# Patient Record
Sex: Male | Born: 1950 | Race: White | Hispanic: No | Marital: Married | Smoking: Never smoker
Health system: Southern US, Community
[De-identification: ages and names within clinical notes are randomized; demographics above are authoritative.]

## PROBLEM LIST (undated history)

## (undated) DIAGNOSIS — R7989 Other specified abnormal findings of blood chemistry: Secondary | ICD-10-CM

## (undated) DIAGNOSIS — K625 Hemorrhage of anus and rectum: Secondary | ICD-10-CM

## (undated) DIAGNOSIS — E785 Hyperlipidemia, unspecified: Secondary | ICD-10-CM

## (undated) DIAGNOSIS — F329 Major depressive disorder, single episode, unspecified: Secondary | ICD-10-CM

## (undated) DIAGNOSIS — T7840XA Allergy, unspecified, initial encounter: Secondary | ICD-10-CM

## (undated) DIAGNOSIS — I10 Essential (primary) hypertension: Secondary | ICD-10-CM

## (undated) DIAGNOSIS — K635 Polyp of colon: Secondary | ICD-10-CM

## (undated) DIAGNOSIS — F3289 Other specified depressive episodes: Secondary | ICD-10-CM

## (undated) DIAGNOSIS — E119 Type 2 diabetes mellitus without complications: Secondary | ICD-10-CM

## (undated) HISTORY — DX: Essential (primary) hypertension: I10

## (undated) HISTORY — PX: COLONOSCOPY: SHX174

## (undated) HISTORY — PX: KNEE SURGERY: SHX244

## (undated) HISTORY — DX: Type 2 diabetes mellitus without complications: E11.9

## (undated) HISTORY — PX: POLYPECTOMY: SHX149

## (undated) HISTORY — DX: Polyp of colon: K63.5

## (undated) HISTORY — DX: Major depressive disorder, single episode, unspecified: F32.9

## (undated) HISTORY — PX: CYST REMOVAL TRUNK: SHX6283

## (undated) HISTORY — DX: Hemorrhage of anus and rectum: K62.5

## (undated) HISTORY — DX: Allergy, unspecified, initial encounter: T78.40XA

## (undated) HISTORY — DX: Hyperlipidemia, unspecified: E78.5

## (undated) HISTORY — DX: Other specified abnormal findings of blood chemistry: R79.89

## (undated) HISTORY — DX: Other specified depressive episodes: F32.89

---

## 2006-11-26 ENCOUNTER — Ambulatory Visit: Payer: Self-pay | Admitting: Cardiology

## 2006-12-11 ENCOUNTER — Encounter: Payer: Self-pay | Admitting: Cardiology

## 2006-12-11 ENCOUNTER — Ambulatory Visit: Payer: Self-pay

## 2007-11-16 LAB — HM COLONOSCOPY

## 2007-11-23 ENCOUNTER — Ambulatory Visit: Payer: Self-pay | Admitting: Gastroenterology

## 2007-12-08 ENCOUNTER — Encounter: Payer: Self-pay | Admitting: Gastroenterology

## 2007-12-08 ENCOUNTER — Ambulatory Visit: Payer: Self-pay | Admitting: Gastroenterology

## 2010-12-31 LAB — HM DIABETES FOOT EXAM

## 2010-12-31 LAB — HEMOCCULT GUIAC POC 1CARD (OFFICE)

## 2011-01-07 DIAGNOSIS — E785 Hyperlipidemia, unspecified: Secondary | ICD-10-CM

## 2011-01-07 DIAGNOSIS — E1169 Type 2 diabetes mellitus with other specified complication: Secondary | ICD-10-CM | POA: Insufficient documentation

## 2011-01-07 DIAGNOSIS — M26629 Arthralgia of temporomandibular joint, unspecified side: Secondary | ICD-10-CM | POA: Insufficient documentation

## 2011-01-09 ENCOUNTER — Encounter: Payer: Self-pay | Admitting: Cardiology

## 2011-01-09 ENCOUNTER — Ambulatory Visit
Admission: RE | Admit: 2011-01-09 | Discharge: 2011-01-09 | Payer: Self-pay | Source: Home / Self Care | Attending: Cardiology | Admitting: Cardiology

## 2011-01-09 DIAGNOSIS — R072 Precordial pain: Secondary | ICD-10-CM | POA: Insufficient documentation

## 2011-01-09 DIAGNOSIS — E669 Obesity, unspecified: Secondary | ICD-10-CM

## 2011-01-17 NOTE — Assessment & Plan Note (Signed)
Summary: [facility]  Medications Added SIMVASTATIN 10 MG TABS (SIMVASTATIN) 1 by mouth daily ASPIRIN 81 MG  TABS (ASPIRIN) 1 by mouth daily LOVAZA 1 GM CAPS (OMEGA-3-ACID ETHYL ESTERS) 2 by mouth daily      Allergies Added: ! SULFA  Visit Type:  Initial Consult Primary Provider:  Dr. Christell Constant  CC:  chest pain.  History of Present Illness: The patient presents for evaluation of chest pain.  This pain is similar to pain that he had in the past.  At that time his ETT suggested ischemia.  However, stress perfusion study was negative.  He now presents with pain that starts under his left shoulder blade and radiates around to his anterior chest. This has been going on since about Christmas. It happens almost daily. It typically lasts all day it is mild. He can't bring in home activity. Speech is 5/10 in intensity. It is not associated with nausea vomiting or diaphoresis. He does notice that it's worse with certain movements and he sometimes feels a popping sensation which may worsen it will be followed by relief. He will occasionally take Motrin with some improvement. He has not had nausea or vomiting. He has not had diaphoresis. He has no shortness of breath. There is no jaw or arm discomfort.  Current Medications (verified): 1)  Simvastatin 10 Mg Tabs (Simvastatin) .Marland Kitchen.. 1 By Mouth Daily 2)  Aspirin 81 Mg  Tabs (Aspirin) .Marland Kitchen.. 1 By Mouth Daily 3)  Lovaza 1 Gm Caps (Omega-3-Acid Ethyl Esters) .... 2 By Mouth Daily  Allergies (verified): 1)  ! 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 Dyslipidemia Snoring  Past Surgical History: Cyst resected.  Family History: Not contributory for early coronary disease, but his father did have bypass in his 66s.  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  He does not smoke cigarettes or drink alcohol.  Review of Systems     As stated in the HPI and negative for all other systems.   Vital Signs:  Patient profile:   60 year old male Height:      70 inches Weight:      255 pounds BMI:     36.72 Pulse rate:   78 / minute Resp:     18 per minute BP sitting:   138 / 88  (right arm)  Vitals Entered By: Marrion Coy, CNA (January 09, 2011 3:48 PM)  Physical Exam  General:  Well developed, well nourished, in no acute distress. Head:  normocephalic and atraumatic Eyes:  PERRLA/EOM intact; conjunctiva and lids normal. Mouth:  Teeth, gums and palate normal. Oral mucosa normal. Neck:  Neck supple, no JVD. No masses, thyromegaly or abnormal cervical nodes. Chest Wall:  no deformities or breast masses noted Lungs:  Clear bilaterally to auscultation and percussion. Abdomen:  Bowel sounds positive; abdomen soft and non-tender without masses, organomegaly, or hernias noted. No hepatosplenomegaly, splenomegaly, Obese Msk:  Back normal, normal gait. Muscle strength and tone normal. Extremities:  No clubbing or cyanosis. Neurologic:  Alert and oriented x 3. Skin:  Intact without lesions or rashes. Cervical Nodes:  no significant adenopathy Inguinal Nodes:  no significant adenopathy Psych:  Normal affect.   Detailed Cardiovascular Exam  Neck    Carotids: Carotids full and equal bilaterally without bruits.      Neck Veins: Normal, no JVD.  4cm JVD.    Heart  Inspection: no deformities or lifts noted.      Palpation: normal PMI with no thrills palpable.      Auscultation: regular rate and rhythm, S1, S2 without murmurs, rubs, gallops, or clicks.    Vascular    Abdominal Aorta: no palpable masses, pulsations, or audible bruits.      Femoral Pulses: normal femoral pulses bilaterally.      Pedal Pulses: normal pedal pulses bilaterally.      Radial Pulses: normal radial pulses bilaterally.      Peripheral Circulation: no clubbing, cyanosis, or edema noted with normal capillary refill.     EKG  Procedure  date:  01/09/2011  Findings:      Sinus rhythm, rate 78, axis within normal limits, intervals within normal limits, no acute ST-T wave changes  Impression & Recommendations:  Problem # 1:  PRECORDIAL PAIN (ICD-786.51) The patient's pain is an atypical.  However, he does have cardiovascular risk factors. I did a screening with an exercise treadmill test would be indicated. I do note that he had some ST depression on his previous treadmill and I will be aware of this to try to avoid false positive reading. Orders: EKG w/ Interpretation (93000) Treadmill (Treadmill)  Problem # 2:  HYPERLIPIDEMIA (ICD-272.4) His lipids were not well controlled with the last reading and I have discussed with him the importance of diet along with followup for his medications. I will defer to his primary providers.  Problem # 3:  OBESITY, UNSPECIFIED (ICD-278.00) We began to discuss weight loss strategies and I will reemphasize this at the time of the treadmill.  Patient Instructions: 1)  Your physician recommends that you schedule a follow-up appointment at the time of your treadmill 2)  Your physician recommends that you continue on your current medications as directed. Please refer to the Current Medication list given to you today. 3)  Your physician has requested that you have an exercise tolerance test.  For further information please visit [email or web address]  Please also follow instruction sheet, as given.

## 2011-02-06 ENCOUNTER — Encounter: Payer: Self-pay | Admitting: Cardiology

## 2011-02-06 ENCOUNTER — Encounter (INDEPENDENT_AMBULATORY_CARE_PROVIDER_SITE_OTHER): Payer: BC Managed Care – PPO | Admitting: Cardiology

## 2011-02-06 ENCOUNTER — Encounter (INDEPENDENT_AMBULATORY_CARE_PROVIDER_SITE_OTHER): Payer: BC Managed Care – PPO

## 2011-02-06 DIAGNOSIS — R072 Precordial pain: Secondary | ICD-10-CM

## 2011-02-06 DIAGNOSIS — R0989 Other specified symptoms and signs involving the circulatory and respiratory systems: Secondary | ICD-10-CM

## 2011-03-29 ENCOUNTER — Encounter: Payer: Self-pay | Admitting: Family Medicine

## 2011-03-29 DIAGNOSIS — F411 Generalized anxiety disorder: Secondary | ICD-10-CM | POA: Insufficient documentation

## 2011-03-29 DIAGNOSIS — F329 Major depressive disorder, single episode, unspecified: Secondary | ICD-10-CM | POA: Insufficient documentation

## 2011-03-29 DIAGNOSIS — Z7985 Long-term (current) use of injectable non-insulin antidiabetic drugs: Secondary | ICD-10-CM | POA: Insufficient documentation

## 2011-03-29 DIAGNOSIS — K625 Hemorrhage of anus and rectum: Secondary | ICD-10-CM

## 2011-03-29 DIAGNOSIS — K635 Polyp of colon: Secondary | ICD-10-CM

## 2011-03-29 DIAGNOSIS — F3289 Other specified depressive episodes: Secondary | ICD-10-CM | POA: Insufficient documentation

## 2011-03-29 DIAGNOSIS — E119 Type 2 diabetes mellitus without complications: Secondary | ICD-10-CM

## 2011-03-29 DIAGNOSIS — R7989 Other specified abnormal findings of blood chemistry: Secondary | ICD-10-CM

## 2011-03-29 DIAGNOSIS — N400 Enlarged prostate without lower urinary tract symptoms: Secondary | ICD-10-CM | POA: Insufficient documentation

## 2011-04-19 ENCOUNTER — Encounter: Payer: Self-pay | Admitting: Cardiology

## 2011-05-03 NOTE — Assessment & Plan Note (Signed)
[facility]                            CARDIOLOGY OFFICE NOTE   ALANSON, HAUSMANN                     MRN:          [id number]  DATE:11/26/2006                            DOB:          16-Jun-1951    PRIMARY CARE PHYSICIAN:  Ernestina Penna, M.D.   REASON FOR REFERRAL:  Evaluate patient with chest pain.   HISTORY OF PRESENT ILLNESS:  The patient presents for followup of the  above.  He is a 60 year old gentleman, who has had some chest discomfort  in the past and had a treadmill test.  This has been abnormal with some  ischemic ST segment changes.  He has had to have followup with a stress  perfusion study, demonstrating no evidence of ischemia.  He has been  told in the past that he has false positive treadmill tests and probably  should not have a plain exercise treadmill.   Recently, his father had bypass.  His mother also has heart disease.  He  has been quite concerned because of this family history and because he  has been getting some chest discomfort.  He has noted a sporadic  discomfort in his left chest.  He finds it somewhat hard to quantify and  qualify.  It seems to happen at rest.  He is not exercising routinely.  However, he does do some bowling and cannot bring on this discomfort.  It seems to be somewhat moderate in intensity, without radiation to his  jaw or to his arms.  He has had no palpitations, presyncope or syncope.  He denies any associated diaphoresis, nausea or vomiting.  He does not  recall having symptoms like this before.   PAST MEDICAL HISTORY:  Hyperlipidemia, jaw discomfort and questionable  TMJ, depression.   PAST SURGICAL HISTORY:  Cyst resected.   ALLERGIES:  SULFA.   MEDICATIONS:  Ibuprofen.   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  He has a  child and one grandchild.  He does not smoke cigarettes or drink  alcohol.   FAMILY HISTORY:  Contributory for early coronary disease, but  in later  onset first-degree relatives.   REVIEW OF SYSTEMS:  As stated in the HPI.  Positive for snoring,  fatigue, rhinitis, intentional weight-loss.  Negative for other systems.   PHYSICAL EXAMINATION:  The patient is in no distress.  Blood pressure  122/88, heart rate 77 and regular, weight 262 pounds, body mass index  38.  HEENT:  Eyes are unremarkable.  Pupils were equal, round and reactive to  light.  Fundi within normal limits.  Oral mucosa unremarkable.  NECK:  No jugular venous distention, wave form within normal limits.  Carotid upstroke brisk and symmetrical.  No bruits.  No thyromegaly.  LYMPHATICS:  No cervical, axillary or inguinal adenopathy.  LUNGS:  Clear to auscultation bilaterally.  BACK:  No costovertebral angle tenderness.  CHEST:  Unremarkable.  HEART:  PMI not displaced or sustained.  S1 and S2 within normal limits.  No S3, no S4, no murmurs.  ABDOMEN:  Obese, positive bowel sounds, normal in  frequency and pitch.  No bruits, rebound, guarding or midline pulsatile mass.  No  hepatomegaly, no splenomegaly.  SKIN:  No rashes, no nodules.  EXTREMITIES:  2+ pulses throughout, no edema, no cyanosis, no clubbing.  NEUROLOGIC:  Oriented to person, place and time.  Cranial nerves II  through XII grossly intact.  Motor grossly intact.   EKG:  Sinus rhythm, rate 77, axis within normal limits, intervals within  normal limits, no acute STT-wave changes.   ASSESSMENT AND PLAN:  1. Chest discomfort:  Permanent sterilization chest discomfort is      somewhat atypical.  However, he has cardiovascular risk factors.      Exercise treadmill tests have been false positive in the past.      Therefore, we will need the added specificity of stress perfusion      study.  He can walk on a treadmill and have an exercise Cardiolite.      Further evaluation based on these results.  2. Obesity:  The patient is class 2 obesity.  I took the liberty of      discussing diet with him and  suggested a [place] approach.  He      will look into this.  3. Questionable sleep apnea.  The patient does snore and has fatigue.      I discussed sleep study with him and he will consider whether he      wants to have this in the future.  He can discuss this with Dr.      Christell Constant.  4. Followup will be based on the results of the above.     Rollene Rotunda, MD, [facility]  Electronically Signed    JH/MedQ  DD: 11/26/2006  DT: 11/26/2006  Job #: [id number]   cc:   Ernestina Penna, M.D.

## 2012-07-09 DIAGNOSIS — H25819 Combined forms of age-related cataract, unspecified eye: Secondary | ICD-10-CM | POA: Insufficient documentation

## 2012-07-09 DIAGNOSIS — H5210 Myopia, unspecified eye: Secondary | ICD-10-CM | POA: Insufficient documentation

## 2012-10-15 ENCOUNTER — Encounter: Payer: Self-pay | Admitting: Gastroenterology

## 2013-02-12 ENCOUNTER — Other Ambulatory Visit: Payer: Self-pay | Admitting: Family Medicine

## 2013-02-12 DIAGNOSIS — M949 Disorder of cartilage, unspecified: Secondary | ICD-10-CM

## 2013-02-21 ENCOUNTER — Ambulatory Visit
Admission: RE | Admit: 2013-02-21 | Discharge: 2013-02-21 | Disposition: A | Discharge status: Home / Self Care | Payer: BC Managed Care – PPO | Source: Ambulatory Visit | Attending: Family Medicine | Admitting: Family Medicine

## 2013-02-21 DIAGNOSIS — M899 Disorder of bone, unspecified: Secondary | ICD-10-CM

## 2013-03-02 ENCOUNTER — Encounter: Payer: Self-pay | Admitting: Family Medicine

## 2013-03-02 ENCOUNTER — Ambulatory Visit (INDEPENDENT_AMBULATORY_CARE_PROVIDER_SITE_OTHER): Payer: BC Managed Care – PPO | Admitting: Family Medicine

## 2013-03-02 VITALS — BP 129/74 | HR 77 | Temp 97.6°F | Ht 69.0 in | Wt 252.0 lb

## 2013-03-02 DIAGNOSIS — M4802 Spinal stenosis, cervical region: Secondary | ICD-10-CM

## 2013-03-02 NOTE — Progress Notes (Signed)
  Subjective:    Patient ID: Richard Nelson, male    DOB: Aug 08, 1951, 62 y.o.   MRN: [id number]  HPI patient is here today to review MRI and C-spine results. He has a history of neck pain and neuropathy. He has no new problems today.    Review of Systems not performed today     Objective:   Physical Exam  Constitutional: He is oriented to person, place, and time. He appears well-developed and well-nourished.  HENT:  Head: Normocephalic.  Cardiovascular: Normal rate.   Pulmonary/Chest: Effort normal.  Musculoskeletal: Normal range of motion.  Neurological: He is alert and oriented to person, place, and time. He has normal reflexes.  Psychiatric: He has a normal mood and affect. His behavior is normal. Judgment normal.          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 Jeral Fruit, neurosurgeon

## 2013-03-05 ENCOUNTER — Ambulatory Visit: Payer: Self-pay | Admitting: Family Medicine

## 2013-06-07 ENCOUNTER — Encounter: Payer: Self-pay | Admitting: Gastroenterology

## 2013-06-16 ENCOUNTER — Ambulatory Visit (AMBULATORY_SURGERY_CENTER): Payer: BC Managed Care – PPO | Admitting: *Deleted

## 2013-06-16 ENCOUNTER — Encounter: Payer: Self-pay | Admitting: Gastroenterology

## 2013-06-16 VITALS — Ht 69.5 in | Wt 256.6 lb

## 2013-06-16 DIAGNOSIS — Z86010 Personal history of colonic polyps: Secondary | ICD-10-CM

## 2013-06-16 MED ORDER — MOVIPREP 100 G PO SOLR: 1.0000 | Freq: Once | ORAL | Status: DC

## 2013-06-16 NOTE — Progress Notes (Signed)
NO EGG OR SOY ALLERGY. EWM NO HOME 02 USE. EWM NO PROBLEMS WITH PAST SEDATION. EWM PREVIOUS COLONOSCOPY WITH STARK 2008. ewm

## 2013-07-02 ENCOUNTER — Ambulatory Visit (AMBULATORY_SURGERY_CENTER): Payer: BC Managed Care – PPO | Admitting: Gastroenterology

## 2013-07-02 ENCOUNTER — Encounter: Payer: Self-pay | Admitting: Gastroenterology

## 2013-07-02 VITALS — BP 159/84 | HR 76 | Temp 97.6°F | Resp 18 | Ht 69.5 in | Wt 256.0 lb

## 2013-07-02 DIAGNOSIS — Z86010 Personal history of colonic polyps: Secondary | ICD-10-CM

## 2013-07-02 DIAGNOSIS — D126 Benign neoplasm of colon, unspecified: Secondary | ICD-10-CM

## 2013-07-02 MED ORDER — SODIUM CHLORIDE 0.9 % IV SOLN: 500.0000 mL | INTRAVENOUS | Status: DC

## 2013-07-02 NOTE — Progress Notes (Signed)
room staff made aware of elevated blood pressure and pt. Drinking 2 0z of water at 12 noon prior to procedure.

## 2013-07-02 NOTE — Op Note (Signed)
[facility] [address] [place], [address]   COLONOSCOPY PROCEDURE REPORT  PATIENT: Richard Nelson, Richard Nelson  MR#: [id number] BIRTHDATE: [birth year] , 61  yrs. old GENDER: Male ENDOSCOPIST: Meryl Dare, MD, [facility] PROCEDURE DATE:  07/02/2013 PROCEDURE:   Colonoscopy with snare polypectomy ASA CLASS:   Class II INDICATIONS:Patient's personal history of adenomatous colon polyps and Last colonoscopy performed 5 years ago. MEDICATIONS: MAC sedation, administered by CRNA and propofol (Diprivan) 200mg  IV DESCRIPTION OF PROCEDURE:   After the risks benefits and alternatives of the procedure were thoroughly explained, informed consent was obtained.  A digital rectal exam revealed no abnormalities of the rectum.   The LB [id number]  endoscope was introduced through the anus and advanced to the cecum, which was identified by both the appendix and ileocecal valve. No adverse events experienced.   The quality of the prep was excellent, using MoviPrep  The instrument was then slowly withdrawn as the colon was fully examined.  COLON FINDINGS: Mild diverticulosis was noted in the transverse colon.   A sessile polyp measuring 5 mm in size was found in the transverse colon.  A polypectomy was performed with a cold snare. The resection was complete and the polyp tissue was completely retrieved.   The colon was otherwise normal.  There was no diverticulosis, inflammation, polyps or cancers unless previously stated.  Retroflexed views revealed small internal hemorrhoids. The time to cecum=1 minutes 38 seconds.  Withdrawal time=12 minutes 04 seconds.  The scope was withdrawn and the procedure completed.  COMPLICATIONS: There were no complications.  ENDOSCOPIC IMPRESSION: 1.   Mild diverticulosis was noted in the transverse colon 2.   Sessile polyp measuring 5 mm in the transverse colon; polypectomy performed with a cold snare 3.   Small internal  hemorrhoids  RECOMMENDATIONS: 1.  Await pathology results 2.  Repeat Colonoscopy in 5 years.   eSigned:  Meryl Dare, MD, [facility] 07/02/2013 2:07 PM

## 2013-07-02 NOTE — Patient Instructions (Addendum)
YOU HAD AN ENDOSCOPIC PROCEDURE TODAY AT THE [facility]: Refer to the procedure report that was given to you for any specific questions about what was found during the examination.  If the procedure report does not answer your questions, please call your gastroenterologist to clarify.  If you requested that your care partner not be given the details of your procedure findings, then the procedure report has been included in a sealed envelope for you to review at your convenience later.  YOU SHOULD EXPECT: Some feelings of bloating in the abdomen. Passage of more gas than usual.  Walking can help get rid of the air that was put into your GI tract during the procedure and reduce the bloating. If you had a lower endoscopy (such as a colonoscopy or flexible sigmoidoscopy) you may notice spotting of blood in your stool or on the toilet paper. If you underwent a bowel prep for your procedure, then you may not have a normal bowel movement for a few days.  DIET: Your first meal following the procedure should be a light meal and then it is ok to progress to your normal diet.  A half-sandwich or bowl of soup is an example of a good first meal.  Heavy or fried foods are harder to digest and may make you feel nauseous or bloated.  Likewise meals heavy in dairy and vegetables can cause extra gas to form and this can also increase the bloating.  Drink plenty of fluids but you should avoid alcoholic beverages for 24 hours.  Please, take your blood pressure medicine when you get home.  It has been high in recovery.  Try to swtay away from very salty and fried foods, and eat more fresh veggies and fruit.   Exercising would be helpful too.  ACTIVITY: Your care partner should take you home directly after the procedure.  You should plan to take it easy, moving slowly for the rest of the day.  You can resume normal activity the day after the procedure however you should NOT DRIVE or use heavy machinery for 24 hours  (because of the sedation medicines used during the test).    SYMPTOMS TO REPORT IMMEDIATELY: A gastroenterologist can be reached at any hour.  During normal business hours, 8:30 AM to 5:00 PM Monday through Friday, call [phone].  After hours and on weekends, please call the GI answering service at [phone] who will take a message and have the physician on call contact you.   Following lower endoscopy (colonoscopy or flexible sigmoidoscopy):  Excessive amounts of blood in the stool  Significant tenderness or worsening of abdominal pains  Swelling of the abdomen that is new, acute  Fever of 100F or higher  FOLLOW UP: If any biopsies were taken you will be contacted by phone or by letter within the next 1-3 weeks.  Call your gastroenterologist if you have not heard about the biopsies in 3 weeks.  Our staff will call the home number listed on your records the next business day following your procedure to check on you and address any questions or concerns that you may have at that time regarding the information given to you following your procedure. This is a courtesy call and so if there is no answer at the home number and we have not heard from you through the emergency physician on call, we will assume that you have returned to your regular daily activities without incident.  SIGNATURES/CONFIDENTIALITY: You and/or your care partner  have signed paperwork which will be entered into your electronic medical record.  These signatures attest to the fact that that the information above on your After Visit Summary has been reviewed and is understood.  Full responsibility of the confidentiality of this discharge information lies with you and/or your care-partner.

## 2013-07-02 NOTE — Progress Notes (Signed)
Called to room to assist during endoscopic procedure.  Patient ID and intended procedure confirmed with present staff. Received instructions for my participation in the procedure from the performing physician.  

## 2013-07-02 NOTE — Progress Notes (Addendum)
Patient did not have preoperative order for IV antibiotic SSI prophylaxis. (G8918)  Patient did not experience any of the following events: a burn prior to discharge; a fall within the facility; wrong site/side/patient/procedure/implant event; or a hospital transfer or hospital admission upon discharge from the facility. (G8907)  

## 2013-07-05 ENCOUNTER — Telehealth: Payer: Self-pay | Admitting: *Deleted

## 2013-07-05 NOTE — Telephone Encounter (Signed)
  Follow up Call-  Call back number 07/02/2013  Post procedure Call Back phone  # [phone]  Permission to leave phone message Yes   No answer at # given.  Left message on voicemail.

## 2013-07-07 ENCOUNTER — Encounter: Payer: Self-pay | Admitting: Gastroenterology

## 2013-07-08 ENCOUNTER — Other Ambulatory Visit: Payer: Self-pay | Admitting: Family Medicine

## 2013-08-10 ENCOUNTER — Other Ambulatory Visit: Payer: Self-pay | Admitting: Family Medicine

## 2013-08-11 NOTE — Telephone Encounter (Signed)
Last seen 02/20/13 DWM  Last lipid 08/21/12

## 2013-08-11 NOTE — Telephone Encounter (Signed)
This patient needs to come in and get blood work .

## 2013-09-13 ENCOUNTER — Other Ambulatory Visit: Payer: Self-pay | Admitting: Family Medicine

## 2013-09-15 ENCOUNTER — Other Ambulatory Visit: Payer: Self-pay

## 2013-09-15 MED ORDER — SIMVASTATIN 10 MG PO TABS: 10.0000 mg | ORAL_TABLET | Freq: Every day | ORAL | Status: DC

## 2013-09-15 NOTE — Telephone Encounter (Signed)
You may refill this prescription x1, the patient must understand he has to get lipid liver panel done at least every 6 months for [place] to continue to refill the medication

## 2013-09-15 NOTE — Telephone Encounter (Signed)
Last lipids 08/21/12  DWM

## 2013-10-02 ENCOUNTER — Other Ambulatory Visit: Payer: Self-pay | Admitting: Family Medicine

## 2013-10-04 NOTE — Telephone Encounter (Signed)
Please approve a one month's supply and please emphasize to the patient the importance of him coming in to be checked, he needs an appointment

## 2013-10-04 NOTE — Telephone Encounter (Signed)
Last seen 03/02/13 DWM  Last glucose 07/02/13  Patient requesting a day supply

## 2013-10-16 ENCOUNTER — Other Ambulatory Visit: Payer: Self-pay | Admitting: Family Medicine

## 2013-10-18 NOTE — Telephone Encounter (Signed)
Patient notified at last refill NTBS. Please advise 

## 2013-10-19 ENCOUNTER — Other Ambulatory Visit (INDEPENDENT_AMBULATORY_CARE_PROVIDER_SITE_OTHER): Payer: BC Managed Care – PPO

## 2013-10-19 DIAGNOSIS — E559 Vitamin D deficiency, unspecified: Secondary | ICD-10-CM

## 2013-10-19 DIAGNOSIS — E1059 Type 1 diabetes mellitus with other circulatory complications: Secondary | ICD-10-CM

## 2013-10-19 DIAGNOSIS — I10 Essential (primary) hypertension: Secondary | ICD-10-CM

## 2013-10-19 DIAGNOSIS — E785 Hyperlipidemia, unspecified: Secondary | ICD-10-CM

## 2013-10-19 LAB — POCT CBC
HCT, POC: 45.1 % (ref 43.5–53.7)
Lymph, poc: 1.8 (ref 0.6–3.4)
MCH, POC: 26.8 pg — AB (ref 27–31.2)
MCV: 79.1 fL — AB (ref 80–97)
Platelet Count, POC: 197 10*3/uL (ref 142–424)
RBC: 5.7 M/uL (ref 4.69–6.13)
RDW, POC: 12.2 %
WBC: 6.8 10*3/uL (ref 4.6–10.2)

## 2013-10-19 NOTE — Progress Notes (Signed)
Pt came in for labs only 

## 2013-10-21 LAB — BMP8+EGFR
BUN/Creatinine Ratio: 15 (ref 10–22)
BUN: 11 mg/dL (ref 8–27)
CO2: 21 mmol/L (ref 18–29)
Calcium: 9.3 mg/dL (ref 8.6–10.2)
Chloride: 98 mmol/L (ref 97–108)
GFR calc Af Amer: 115 mL/min/{1.73_m2} (ref >59)
GFR calc non Af Amer: 100 mL/min/{1.73_m2} (ref >59)
Potassium: 4.4 mmol/L (ref 3.5–5.2)

## 2013-10-21 LAB — HEPATIC FUNCTION PANEL
AST: 25 IU/L (ref 0–40)
Albumin: 4.2 g/dL (ref 3.6–4.8)
Alkaline Phosphatase: 115 IU/L (ref 39–117)
Bilirubin, Direct: 0.12 mg/dL (ref 0.00–0.40)
Total Bilirubin: 0.3 mg/dL (ref 0.0–1.2)

## 2013-10-21 LAB — NMR, LIPOPROFILE
Cholesterol: 140 mg/dL (ref <200)
HDL Particle Number: 29.7 umol/L — ABNORMAL LOW (ref >=30.5)
LDL Size: 19.6 nm — ABNORMAL LOW (ref >20.5)
LDLC SERPL CALC-MCNC: 70 mg/dL (ref <100)
LP-IR Score: 67 — ABNORMAL HIGH (ref <=45)
Small LDL Particle Number: 1453 nmol/L — ABNORMAL HIGH (ref <=527)

## 2013-10-25 ENCOUNTER — Ambulatory Visit (INDEPENDENT_AMBULATORY_CARE_PROVIDER_SITE_OTHER): Payer: BC Managed Care – PPO | Admitting: Family Medicine

## 2013-10-25 ENCOUNTER — Ambulatory Visit (INDEPENDENT_AMBULATORY_CARE_PROVIDER_SITE_OTHER): Payer: BC Managed Care – PPO

## 2013-10-25 ENCOUNTER — Encounter: Payer: Self-pay | Admitting: Family Medicine

## 2013-10-25 VITALS — BP 139/84 | HR 92 | Temp 99.7°F | Ht 69.5 in | Wt 252.0 lb

## 2013-10-25 DIAGNOSIS — N401 Enlarged prostate with lower urinary tract symptoms: Secondary | ICD-10-CM

## 2013-10-25 DIAGNOSIS — J309 Allergic rhinitis, unspecified: Secondary | ICD-10-CM

## 2013-10-25 DIAGNOSIS — J069 Acute upper respiratory infection, unspecified: Secondary | ICD-10-CM

## 2013-10-25 DIAGNOSIS — Z0000 Encounter for general adult medical examination without abnormal findings: Secondary | ICD-10-CM

## 2013-10-25 DIAGNOSIS — E785 Hyperlipidemia, unspecified: Secondary | ICD-10-CM

## 2013-10-25 DIAGNOSIS — N400 Enlarged prostate without lower urinary tract symptoms: Secondary | ICD-10-CM

## 2013-10-25 DIAGNOSIS — E119 Type 2 diabetes mellitus without complications: Secondary | ICD-10-CM

## 2013-10-25 DIAGNOSIS — R05 Cough: Secondary | ICD-10-CM

## 2013-10-25 MED ORDER — SIMVASTATIN 10 MG PO TABS: 10.0000 mg | ORAL_TABLET | Freq: Every day | ORAL | Status: DC

## 2013-10-25 MED ORDER — AMOXICILLIN 500 MG PO CAPS: 500.0000 mg | ORAL_CAPSULE | Freq: Four times a day (QID) | ORAL | Status: DC

## 2013-10-25 MED ORDER — VITAMIN D (ERGOCALCIFEROL) 1.25 MG (50000 UNIT) PO CAPS: 50000.0000 [IU] | ORAL_CAPSULE | ORAL | Status: DC

## 2013-10-25 MED ORDER — SIMVASTATIN 20 MG PO TABS: 20.0000 mg | ORAL_TABLET | Freq: Every day | ORAL | Status: DC

## 2013-10-25 MED ORDER — ENALAPRIL MALEATE 5 MG PO TABS: ORAL_TABLET | ORAL | Status: DC

## 2013-10-25 MED ORDER — OMEGA-3-ACID ETHYL ESTERS 1 G PO CAPS: ORAL_CAPSULE | ORAL | Status: DC

## 2013-10-25 NOTE — Patient Instructions (Addendum)
Continue current medications. Continue good therapeutic lifestyle changes. You must do a better job with weight loss diet and exercise Fall precautions discussed with patient. Follow up as planned and earlier as needed. Monitor blood sugars regularly-brain list in with blood sugars when you meet with the clinical pharmacist Try to drink less fluids late in the evening  Take prescribed medicine as directed

## 2013-10-25 NOTE — Progress Notes (Addendum)
Subjective:    Patient ID: Richard Nelson, male    DOB: 01/26/1951, 62 y.o.   MRN: [id number]  HPI Pt here for follow up and management of chronic medical problems and sinus problem. Patient comes in today for his annual exam but with multiple problems to follow up on. Specifically today he complains of head congestion and a low-grade fever for a couple of days. He also would this has left ear pain and a sore throat. He indicates he's been coughing for about a week. He says the sputum is clear. The patient had his annual eye exam this past summer. He had a stress test in February 2012. He had a colonoscopy this summer and a polyp was found and a colonoscopy will be repeated again in 5 years. He complains of joint pain in his left knee. He also indicates he has nocturia x1.       Patient Active Problem List   Diagnosis Date Noted  . Anxiety states   . Depressive disorder, not elsewhere classified   . Hemorrhage of rectum and anus   . Polyp of colon   . Hyperplasia of prostate   . DM (diabetes mellitus)   . 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 OBESITY, UNSPECIFIED 01/09/2011  . PRECORDIAL PAIN 01/09/2011  . HYPERLIPIDEMIA 01/07/2011  . TMJ PAIN 01/07/2011   Outpatient Encounter Prescriptions as of 10/25/2013  Medication Sig  . aspirin 81 MG EC tablet Take 81 mg by mouth daily.    . Cholecalciferol (VITAMIN D3) 3000 UNITS TABS Take 1 capsule by mouth daily.  . enalapril (VASOTEC) 5 MG tablet TAKE 1 TABLET BY MOUTH DAILY  . metFORMIN (GLUCOPHAGE) 500 MG tablet TAKE 2 TABLETS 2 TIMES A DAY  . omega-3 acid ethyl esters (LOVAZA) 1 G capsule TAKE 2 CAPSUELS TWICE DAILY  . simvastatin (ZOCOR) 10 MG tablet Take 1 tablet (10 mg total) by mouth at bedtime.  . [DISCONTINUED] Naproxen Sodium (ALEVE PO) Take 2 capsules by mouth daily.    Review of Systems  Constitutional: Positive for fever.  HENT: Positive for congestion, ear pain, postnasal drip and sinus pressure.   Eyes:  Negative.   Respiratory: Negative.   Cardiovascular: Negative.   Gastrointestinal: Negative.   Endocrine: Negative.   Genitourinary: Negative.   Musculoskeletal: Positive for arthralgias (right foot pain).  Skin: Negative.   Allergic/Immunologic: Negative.   Neurological: Negative.   Hematological: Negative.   Psychiatric/Behavioral: Negative.        Objective:   Physical Exam  Nursing note and vitals reviewed. Constitutional: He is oriented to person, place, and time. He appears well-developed and well-nourished. No distress.  Overweight, pleasant, calm  HENT:  Head: Normocephalic and atraumatic.  Right Ear: External ear normal.  Left Ear: External ear normal.  Mouth/Throat: No oropharyngeal exudate.  Nasal congestion bilaterally Throat slightly red posteriorly No sinus tenderness to palpation  Eyes: Conjunctivae and EOM are normal. Pupils are equal, round, and reactive to light. Right eye exhibits no discharge. Left eye exhibits no discharge. No scleral icterus.  Neck: Normal range of motion. Neck supple. No tracheal deviation present. No thyromegaly present.  Cardiovascular: Normal rate, regular rhythm, normal heart sounds and intact distal pulses.  Exam reveals no gallop and no friction rub.   No murmur heard. 72 per min  Pulmonary/Chest: Effort normal and breath sounds normal. No respiratory distress. He has no wheezes. He has no rales.  Abdominal: Soft. Bowel sounds are normal. He exhibits no mass. There  is no tenderness. There is no rebound and no guarding.  Obesity  Genitourinary: Rectum normal and penis normal.  Prostate was enlarged, smooth and without nodules. There were no rectal masses. There was no hernia palpated on either side. Testicles and scrotum were normal.  Musculoskeletal: Normal range of motion. He exhibits no edema and no tenderness.  Lymphadenopathy:    He has no cervical adenopathy.  Neurological: He is alert and oriented to person, place, and time.  He has normal reflexes. No cranial nerve deficit.  Skin: Skin is warm and dry. No rash noted. No erythema. No pallor.  Psychiatric: He has a normal mood and affect. His behavior is normal. Judgment and thought content normal.   BP 139/84  Pulse 92  Temp(Src) 99.7 F (37.6 C) (Oral)  Ht 5' 9.5" (1.765 m)  Wt 252 lb (114.306 kg)  BMI 36.69 kg/m2  Patient has not been taking his medication regularly and is not watching his diet as closely as he can. His lab work was reviewed with him today. His vitamin D was low. His LDL particle number was elevated. His hemoglobin A1c was elevated into the 9 range.  [facility] reading (PRIMARY) by  Dr.Danis Pembleton: Chest X.ray--within normal limits                                     Assessment & Plan:   1. Annual physical exam   2. DM (diabetes mellitus), poor control   3. HYPERLIPIDEMIA   4. URI (upper respiratory infection)   5. Allergic rhinitis   6. Cough   7. Nocturia associated with benign prostatic hypertrophy   8. BPH (benign prostatic hyperplasia)    Orders Placed This Encounter  Procedures  . DG Chest 2 View    Standing Status: Future     Number of Occurrences: 1     Standing Expiration Date: 12/25/2014    Order Specific Question:  Reason for Exam (SYMPTOM  OR DIAGNOSIS REQUIRED)    Answer:  annual physical    Order Specific Question:  Preferred imaging location?    Answer:  Internal  . PSA, total and free  . Microalbumin, urine  . POCT UA - Microalbumin   Meds ordered this encounter  Medications  . enalapril (VASOTEC) 5 MG tablet    Sig: TAKE 1 TABLET BY MOUTH DAILY    Dispense:  90 tablet    Refill:  2    Must be seen before next refill  . omega-3 acid ethyl esters (LOVAZA) 1 G capsule    Sig: TAKE 2 CAPSUELS TWICE DAILY    Dispense:  360 capsule    Refill:  3  . DISCONTD: simvastatin (ZOCOR) 10 MG tablet    Sig: Take 1 tablet (10 mg total) by mouth at bedtime.    Dispense:  90 tablet    Refill:  3  . Vitamin D, Ergocalciferol,  (DRISDOL) 50000 UNITS CAPS capsule    Sig: Take 1 capsule (50,000 Units total) by mouth every 7 (seven) days.    Dispense:  12 capsule    Refill:  2  . amoxicillin (AMOXIL) 500 MG capsule    Sig: Take 1 capsule (500 mg total) by mouth 4 (four) times daily.    Dispense:  40 capsule    Refill:  0  . simvastatin (ZOCOR) 20 MG tablet    Sig: Take 1 tablet (20 mg total) by mouth at  bedtime.    Dispense:  90 tablet    Refill:  3   Patient Instructions  Continue current medications. Continue good therapeutic lifestyle changes. You must do a better job with weight loss diet and exercise Fall precautions discussed with patient. Follow up as planned and earlier as needed. Monitor blood sugars regularly-brain list in with blood sugars when you meet with the clinical pharmacist Try to drink less fluids late in the evening  Take prescribed medicine as directed   Nyra Capes MD

## 2013-10-26 LAB — PSA, TOTAL AND FREE
PSA, Free Pct: 28 %
PSA, Free: 0.14 ng/mL
PSA: 0.5 ng/mL (ref 0.0–4.0)

## 2013-10-26 LAB — MICROALBUMIN, URINE: Microalbumin, Urine: 7 ug/mL (ref 0.0–17.0)

## 2013-11-10 ENCOUNTER — Ambulatory Visit (INDEPENDENT_AMBULATORY_CARE_PROVIDER_SITE_OTHER): Payer: BC Managed Care – PPO | Admitting: Pharmacist

## 2013-11-10 ENCOUNTER — Encounter: Payer: Self-pay | Admitting: Pharmacist

## 2013-11-10 VITALS — BP 136/82 | HR 85 | Ht 69.5 in | Wt 246.0 lb

## 2013-11-10 DIAGNOSIS — R635 Abnormal weight gain: Secondary | ICD-10-CM

## 2013-11-10 DIAGNOSIS — E669 Obesity, unspecified: Secondary | ICD-10-CM

## 2013-11-10 DIAGNOSIS — E119 Type 2 diabetes mellitus without complications: Secondary | ICD-10-CM

## 2013-11-10 DIAGNOSIS — E785 Hyperlipidemia, unspecified: Secondary | ICD-10-CM

## 2013-11-10 MED ORDER — ONETOUCH DELICA LANCETS FINE MISC: Status: DC

## 2013-11-10 MED ORDER — GLUCOSE BLOOD VI STRP: ORAL_STRIP | Status: DC

## 2013-11-10 MED ORDER — CANAGLIFLOZIN-METFORMIN HCL 50-1000 MG PO TABS: 1.0000 | ORAL_TABLET | Freq: Two times a day (BID) | ORAL | Status: DC

## 2013-11-10 NOTE — Progress Notes (Signed)
Diabetes Flow Sheet:  Visit 1  Chief Complaint:   Chief Complaint  Patient presents with  . Diabetes  . Hyperlipidemia    HPI: Type 2 DM - patient diagnoses about 3 years ago with type 2DM.  He has been prescribed metfromin 500mg  2 tablets twice a day but admits to not taking regularly.  He had diabetes education when initially diagnoses but is not currently following ADA diet and is not sure about BG and A1C goals.  He is checking HBG 2 to 4 times daily.   Highest reading was 295.  Lowest reading 117.  Over the last 2 weeks he has been more compliant with metformin and HBG reading have been improving.  Hyperlipidemia - LDL-P continues to be elevated, although LDL-C was at goal of less than 100.  Tg is elevated and HDL low.  He is taking zocor 20mg  daily but in the morning instead of evening as instructed.  He is also taking fish oil 2 capsules twice a day.   Exam Filed Vitals:   11/10/13 1048  BP: 136/82  Pulse: 85   Filed Weights   11/10/13 1048  Weight: 246 lb (111.585 kg)    BMI:  Body mass index is 35.82 kg/(m^2).    Weight changes:  Decreased 6 lbs since beginning of November with intentional changes in food intake.  General Appearance:  alert, oriented, no acute distress and obese Mood/Affect:  normal  Low fat/carbohydrate diet?  No Nicotine Abuse?  No Medication Compliance?  No Exercise?  No Alcohol Abuse?  No   Lab Results  Component Value Date   HGBA1C 9.5 10/19/2013    Lab Results  Component Value Date   CHOL 140 10/19/2013     Medication Checklist: ACE Inhibitor/ARB?  Yes Lipid Lowering Agent?  Yes Aspirin?  Yes Oral Hypoglycemic Agent(s)?  Yes  Assessment: 1.  type 2 Diabetes.  Uncontrolled with non compliance with medication and diet 2.  Blood Pressure Control.  controlled 3.  Lipid Control.  Elevated Tg and LDL-P (wrosened by uncontrolled DM) and low HDL 4.  Obesity 5.  Non compiance  Recommendations: 1.  1500 calorie, carbohydrate counting  diet.  Patient is counseled extensively on carbohydrate counting, serving sizes, saturated fat intake and meal planning.  Patient is instructed to eat 3 meals a day and 3 small snacks. 45-50 grams CHO per meal and 15 to 20 grams CHO per snack.   2.  10 to 20 minutes of physical activity per day to start.  Work up to goal of 30 -40 minutes daily.  Patient is counseled to always carry glucose tablets, lifesavers, hard candies, etc., while exercising in case of hypoglycemic event. 3.  Patient is counseled on pathophysiology of diabetes and the risk of long-term complications.  Fasting blood glucose goals are 70-130mg /dL.  Post-prandial goals are < 180.  A1C goals < 7.0%. 4.  LDL goal of < 100, HDL > 40 and TG < 150; BP goal < 140/80 5.  Patient is counseled on proper use of glucometer and lancing device.  Rx given for test strips and lancets.  Patient is given new One Touch Ultra glucometer.  Patient is instructed to test up to twice a day.   6.  Medication recommendations at this time are as follows:  Discontinue metformin, start Invokamet 50/1000mg  take 1 tablet twice a day with breakfast and supper.   Also discussed incretin mimetic medications.  Patient to consider use in future.  7.  RTC in  1 month to recheck BMET and HBG readings  Time spent counseling patient:  45 minutes  PharmD:  Henrene Pastor, PHARMD

## 2013-12-13 ENCOUNTER — Other Ambulatory Visit (INDEPENDENT_AMBULATORY_CARE_PROVIDER_SITE_OTHER): Payer: BC Managed Care – PPO

## 2013-12-13 DIAGNOSIS — E119 Type 2 diabetes mellitus without complications: Secondary | ICD-10-CM

## 2013-12-13 NOTE — Progress Notes (Signed)
Pt here for labs only. 

## 2013-12-14 LAB — BMP8+EGFR
BUN/Creatinine Ratio: 11 (ref 10–22)
CO2: 20 mmol/L (ref 18–29)
Calcium: 10.2 mg/dL (ref 8.6–10.2)
Potassium: 4.1 mmol/L (ref 3.5–5.2)
Sodium: 140 mmol/L (ref 134–144)

## 2013-12-20 ENCOUNTER — Telehealth: Payer: Self-pay | Admitting: Pharmacist

## 2013-12-20 MED ORDER — CANAGLIFLOZIN-METFORMIN HCL 150-1000 MG PO TABS: 1.0000 | ORAL_TABLET | Freq: Two times a day (BID) | ORAL | Status: DC

## 2013-12-20 NOTE — Telephone Encounter (Signed)
Serum creatinine remains stable.  BG better but still elevated.  Recommend increase InvokaMet to 150/1000.g 1 tablet BID. Tried to call - no ans/ LM to call office.

## 2014-02-02 ENCOUNTER — Other Ambulatory Visit: Payer: Self-pay | Admitting: *Deleted

## 2014-02-02 MED ORDER — CANAGLIFLOZIN-METFORMIN HCL 150-1000 MG PO TABS: 1.0000 | ORAL_TABLET | Freq: Two times a day (BID) | ORAL | Status: DC

## 2014-02-03 ENCOUNTER — Ambulatory Visit (INDEPENDENT_AMBULATORY_CARE_PROVIDER_SITE_OTHER): Payer: BC Managed Care – PPO | Admitting: Family Medicine

## 2014-02-03 ENCOUNTER — Encounter: Payer: Self-pay | Admitting: Family Medicine

## 2014-02-03 VITALS — BP 134/91 | HR 98 | Temp 99.2°F | Ht 69.5 in | Wt 246.0 lb

## 2014-02-03 DIAGNOSIS — J069 Acute upper respiratory infection, unspecified: Secondary | ICD-10-CM

## 2014-02-03 MED ORDER — AMOXICILLIN 875 MG PO TABS: 875.0000 mg | ORAL_TABLET | Freq: Two times a day (BID) | ORAL | Status: DC

## 2014-02-03 NOTE — Progress Notes (Signed)
   Subjective:    Patient ID: Richard Nelson, male    DOB: 11-May-1951, 63 y.o.   MRN: [id number]  HPI  This 63 y.o. male presents for evaluation of URI for 3 days.  Review of Systems    No chest pain, SOB, HA, dizziness, vision change, N/V, diarrhea, constipation, dysuria, urinary urgency or frequency, myalgias, arthralgias or rash.  Objective:   Physical Exam  Vital signs noted  Well developed well nourished male.  HEENT - Head atraumatic Normocephalic                Eyes - PERRLA, Conjuctiva - clear Sclera- Clear EOMI                Ears - EAC's Wnl TM's Wnl Gross Hearing WNL                Nose - Nares patent                 Throat - oropharanx wnl Respiratory - Lungs CTA bilateral Cardiac - RRR S1 and S2 without murmur GI - Abdomen soft Nontender and bowel sounds active x 4 Extremities - No edema. Neuro - Grossly intact.      Assessment & Plan:  URI (upper respiratory infection) - Plan: amoxicillin (AMOXIL) 875 MG tablet po bid x 10 days #20. Push po fluids, rest, tylenol and motrin otc prn as directed for fever, arthralgias, and myalgias.  Follow up prn if sx's continue or persist.  Lysbeth Penner FNP

## 2014-02-04 ENCOUNTER — Telehealth: Payer: Self-pay | Admitting: Pharmacist

## 2014-02-04 NOTE — Telephone Encounter (Signed)
[facility] called and said he needed a refill on Medication.

## 2014-02-07 ENCOUNTER — Other Ambulatory Visit (INDEPENDENT_AMBULATORY_CARE_PROVIDER_SITE_OTHER): Payer: BC Managed Care – PPO

## 2014-02-07 DIAGNOSIS — I10 Essential (primary) hypertension: Secondary | ICD-10-CM

## 2014-02-07 DIAGNOSIS — E785 Hyperlipidemia, unspecified: Secondary | ICD-10-CM

## 2014-02-07 DIAGNOSIS — E559 Vitamin D deficiency, unspecified: Secondary | ICD-10-CM

## 2014-02-07 MED ORDER — CANAGLIFLOZIN-METFORMIN HCL 150-1000 MG PO TABS: 1.0000 | ORAL_TABLET | Freq: Two times a day (BID) | ORAL | Status: DC

## 2014-02-07 NOTE — Telephone Encounter (Signed)
[facility]  Left message on patient's VM that office visit and labs are due. Will need before more refills allowed.

## 2014-02-08 ENCOUNTER — Ambulatory Visit: Payer: BC Managed Care – PPO | Admitting: Family Medicine

## 2014-02-09 LAB — BMP8+EGFR
BUN/Creatinine Ratio: 14 (ref 10–22)
BUN: 11 mg/dL (ref 8–27)
CHLORIDE: 101 mmol/L (ref 97–108)
CO2: 21 mmol/L (ref 18–29)
CREATININE: 0.78 mg/dL (ref 0.76–1.27)
Calcium: 9.5 mg/dL (ref 8.6–10.2)
GFR calc non Af Amer: 97 mL/min/{1.73_m2} (ref >59)
GFR, EST AFRICAN AMERICAN: 113 mL/min/{1.73_m2} (ref >59)
GLUCOSE: 141 mg/dL — AB (ref 65–99)
Potassium: 4.3 mmol/L (ref 3.5–5.2)
Sodium: 141 mmol/L (ref 134–144)

## 2014-02-09 LAB — NMR, LIPOPROFILE
CHOLESTEROL: 144 mg/dL (ref <200)
HDL Cholesterol by NMR: 33 mg/dL — ABNORMAL LOW (ref >=40)
HDL Particle Number: 28.5 umol/L — ABNORMAL LOW (ref >=30.5)
LDL Particle Number: 1908 nmol/L — ABNORMAL HIGH (ref <1000)
LDL Size: 19.8 nm — ABNORMAL LOW (ref >20.5)
LDLC SERPL CALC-MCNC: 83 mg/dL (ref <100)
LP-IR SCORE: 60 — AB (ref <=45)
Small LDL Particle Number: 1474 nmol/L — ABNORMAL HIGH (ref <=527)
Triglycerides by NMR: 140 mg/dL (ref <150)

## 2014-02-09 LAB — HEPATITIS PANEL, ACUTE
Hep A IgM: NEGATIVE
Hep B C IgM: NEGATIVE
Hepatitis B Surface Ag: NEGATIVE

## 2014-02-09 LAB — VITAMIN D 25 HYDROXY (VIT D DEFICIENCY, FRACTURES): Vit D, 25-Hydroxy: 57.7 ng/mL (ref 30.0–100.0)

## 2014-02-17 ENCOUNTER — Ambulatory Visit (INDEPENDENT_AMBULATORY_CARE_PROVIDER_SITE_OTHER): Payer: BC Managed Care – PPO | Admitting: Family Medicine

## 2014-02-17 ENCOUNTER — Encounter: Payer: Self-pay | Admitting: Family Medicine

## 2014-02-17 VITALS — BP 118/76 | HR 84 | Temp 98.9°F | Ht 69.5 in | Wt 244.0 lb

## 2014-02-17 DIAGNOSIS — E559 Vitamin D deficiency, unspecified: Secondary | ICD-10-CM

## 2014-02-17 DIAGNOSIS — I10 Essential (primary) hypertension: Secondary | ICD-10-CM

## 2014-02-17 DIAGNOSIS — E119 Type 2 diabetes mellitus without complications: Secondary | ICD-10-CM

## 2014-02-17 DIAGNOSIS — E785 Hyperlipidemia, unspecified: Secondary | ICD-10-CM

## 2014-02-17 LAB — POCT GLYCOSYLATED HEMOGLOBIN (HGB A1C): Hemoglobin A1C: 7.2

## 2014-02-17 MED ORDER — SIMVASTATIN 40 MG PO TABS: 40.0000 mg | ORAL_TABLET | Freq: Every day | ORAL | Status: DC

## 2014-02-17 NOTE — Patient Instructions (Addendum)
      Continue current medications. Continue good therapeutic lifestyle changes which include good diet and exercise. Fall precautions discussed with patient. If an FOBT was given today- please return it to our front desk. If you are over 62 years old - you may need Prevnar 67 or the adult Pneumonia vaccine.  Check with insurance company about coverage of Prevnar and Zostavax Continue to monitor blood sugars closely at home Be careful and avoid heavy lifting and straining it using your neck Use warm compresses to the neck or possibly a Rice PAC that you can [facility] at [facility] We will call you with the results of the hemoglobin A1c was as results are available We are asking you to increase the simvastatin to 40 mg nightly and recheck liver function tests about 4 weeks later. When the liver function test are done you do not have to be fasting.

## 2014-02-17 NOTE — Progress Notes (Signed)
Subjective:    Patient ID: Richard Nelson, male    DOB: Jul 22, 1951, 63 y.o.   MRN: [id number]  HPI  Patient comes in today for follow up on chronic medical conditions.  Review of Systems  Constitutional: Negative.   HENT: Negative.  Negative for sore throat and trouble swallowing.   Eyes: Negative.   Respiratory: Negative.   Cardiovascular: Positive for chest pain (occasional sharp pain episode).  Gastrointestinal: Positive for abdominal pain (epigastric in nature related to eating).  Genitourinary: Negative.   Musculoskeletal: Positive for arthralgias (knee), neck pain and neck stiffness.  Skin: Negative.   Allergic/Immunologic: Negative.   Neurological: Negative.   Hematological: Negative.   Psychiatric/Behavioral: Negative.        Objective:   Physical Exam  Nursing note and vitals reviewed. Constitutional: He is oriented to person, place, and time. He appears well-developed and well-nourished. No distress.  HENT:  Head: Normocephalic and atraumatic.  Right Ear: External ear normal.  Left Ear: External ear normal.  Nose: Nose normal.  Mouth/Throat: Oropharynx is clear and moist. No oropharyngeal exudate.  Eyes: Conjunctivae and EOM are normal. Pupils are equal, round, and reactive to light. Right eye exhibits no discharge. Left eye exhibits no discharge. No scleral icterus.  Neck: Normal range of motion. Neck supple. No thyromegaly present.  No carotid bruits were auscultated  Cardiovascular: Normal rate, regular rhythm, normal heart sounds and intact distal pulses.  Exam reveals no gallop and no friction rub.   No murmur heard. At 72 per minute  Pulmonary/Chest: Effort normal and breath sounds normal. No respiratory distress. He has no wheezes. He has no rales. He exhibits no tenderness.  No axillary nodes  Abdominal: Soft. Bowel sounds are normal. He exhibits no mass. There is no tenderness. There is no rebound and no guarding.  Obesity  Musculoskeletal: Normal  range of motion. He exhibits no edema and no tenderness.  Lymphadenopathy:    He has no cervical adenopathy.  Neurological: He is alert and oriented to person, place, and time. He has normal reflexes.  Skin: Skin is warm and dry. No rash noted. No erythema. No pallor.  Psychiatric: He has a normal mood and affect. His behavior is normal. Judgment and thought content normal.          Assessment & Plan:  1. Essential hypertension, benign -Continue current medication  2. Unspecified vitamin D deficiency -Continue vitamin D 50,000 units weekly  3. DM (diabetes mellitus) -Continue to monitor blood sugars closely at home - POCT glycosylated hemoglobin (Hb A1C)  4. Other and unspecified hyperlipidemia - simvastatin (ZOCOR) 40 MG tablet; Take 1 tablet (40 mg total) by mouth at bedtime.  Dispense: 90 tablet; Refill: 3 - Hepatic function panel; Future  Patient Instructions       Continue current medications. Continue good therapeutic lifestyle changes which include good diet and exercise. Fall precautions discussed with patient. If an FOBT was given today- please return it to our front desk. If you are over 45 years old - you may need Prevnar 75 or the adult Pneumonia vaccine.  Check with insurance company about coverage of Prevnar and Zostavax Continue to monitor blood sugars closely at home Be careful and avoid heavy lifting and straining it using your neck Use warm compresses to the neck or possibly a Rice PAC that you can [facility] We will call you with the results of the hemoglobin A1c was as results are available We are asking you to increase the  simvastatin to 40 mg nightly and recheck liver function tests about 4 weeks later. When the liver function test are done you do not have to be fasting.    Arrie Senate MD

## 2014-02-24 ENCOUNTER — Ambulatory Visit: Payer: BC Managed Care – PPO | Admitting: Family Medicine

## 2014-03-25 ENCOUNTER — Other Ambulatory Visit: Payer: Self-pay | Admitting: Pharmacist

## 2014-03-28 ENCOUNTER — Other Ambulatory Visit: Payer: BC Managed Care – PPO

## 2014-03-28 DIAGNOSIS — E785 Hyperlipidemia, unspecified: Secondary | ICD-10-CM

## 2014-03-28 NOTE — Progress Notes (Signed)
Pt came in for labs only 

## 2014-03-29 LAB — HEPATIC FUNCTION PANEL
ALBUMIN: 4.5 g/dL (ref 3.6–4.8)
ALT: 26 IU/L (ref 0–44)
AST: 18 IU/L (ref 0–40)
Alkaline Phosphatase: 89 IU/L (ref 39–117)
Bilirubin, Direct: 0.1 mg/dL (ref 0.00–0.40)
TOTAL PROTEIN: 6.5 g/dL (ref 6.0–8.5)
Total Bilirubin: 0.3 mg/dL (ref 0.0–1.2)

## 2014-07-02 ENCOUNTER — Other Ambulatory Visit: Payer: Self-pay | Admitting: Family Medicine

## 2014-07-12 ENCOUNTER — Other Ambulatory Visit: Payer: Self-pay | Admitting: Family Medicine

## 2014-07-13 NOTE — Telephone Encounter (Signed)
Last ov 3/15. Pt wants #90.

## 2014-07-18 ENCOUNTER — Other Ambulatory Visit: Payer: Self-pay | Admitting: Family Medicine

## 2014-08-14 ENCOUNTER — Other Ambulatory Visit: Payer: Self-pay | Admitting: Family Medicine

## 2014-09-12 ENCOUNTER — Other Ambulatory Visit: Payer: Self-pay | Admitting: Family Medicine

## 2014-10-10 ENCOUNTER — Other Ambulatory Visit: Payer: Self-pay | Admitting: Family Medicine

## 2014-10-11 NOTE — Telephone Encounter (Signed)
Last seen 02/17/14 DW[facility] Last glucose 02/17/14 last lipid 02/07/14   Requesting 90 day supply simvastatin

## 2014-10-11 NOTE — Telephone Encounter (Signed)
This patient needs an appointment to be seen. It has been over 6 months and to be a diabetic that period of time is too long. Please let him know this. He needs lab work and a visit with a provider

## 2014-10-20 ENCOUNTER — Telehealth: Payer: Self-pay | Admitting: *Deleted

## 2014-10-20 NOTE — Telephone Encounter (Signed)
Tammy would you please look at this and see what insurance would pay for an start appropriate medications

## 2014-10-20 NOTE — Telephone Encounter (Signed)
DR. Laurance Flatten, the ins co denied invokamet because he has not tried [place], janumet,janumet xr, juvisync, jentadueto, victoza, byetta, bydureon, tanzeum, trulicity or a sulfonylurea.  Hope one of these will work. Thanks

## 2014-10-22 NOTE — Telephone Encounter (Signed)
This patient really NTBS or at least have labs check to assess if diabetes if currently controlled with InvokaMet. My preference would be to show that [place] has improved A1c and weight but I have no data to support this.   Again to make an alternative recommendation I need to know more about his current diabetes control.  Please have patient make appt with either his PCP or me.  If he refuses please let me know and I will see what I can do to assist him. I know it is not on the list you sent but try to see if Invokana + metformin would be covered.  If patient needs medication until he can get in for appt then I have given samples until then of Invokana and send rx for metformin.

## 2014-10-24 ENCOUNTER — Telehealth: Payer: Self-pay | Admitting: *Deleted

## 2014-10-24 NOTE — Telephone Encounter (Signed)
Talked with Richard Nelson wife and she said he is doing so well on his invokamet that she would really hate to see him have to switch, she is going to make him an appt ot come in and get lab work ASAP.

## 2014-10-24 NOTE — Telephone Encounter (Signed)
Richard Nelson called and left me a message telling me to add his wife to persons to share his information with.

## 2014-10-25 ENCOUNTER — Telehealth: Payer: Self-pay | Admitting: *Deleted

## 2014-10-25 NOTE — Telephone Encounter (Signed)
Talked with Richard Nelson and he said that he had enough medicine to last until he is off in December at which time he will make an appt. To have labs and exam if needed, he also says he is doing much better on this medication. So we will hold off on PA until we get labs.

## 2014-10-25 NOTE — Telephone Encounter (Signed)
Richard Nelson phoned to say he took care of the ins himself,he said they told him to file [place] in [place] and [place] and he [place] the invokamet in [place] and it paid., hopefully this will continue to be the case he wanted me to make sure this was in his chart, hopefully we have it cpvered.

## 2014-11-08 ENCOUNTER — Other Ambulatory Visit: Payer: Self-pay | Admitting: Family Medicine

## 2014-11-18 ENCOUNTER — Other Ambulatory Visit: Payer: Self-pay | Admitting: Family Medicine

## 2014-11-26 ENCOUNTER — Other Ambulatory Visit: Payer: Self-pay | Admitting: Family Medicine

## 2014-12-05 ENCOUNTER — Ambulatory Visit (INDEPENDENT_AMBULATORY_CARE_PROVIDER_SITE_OTHER): Payer: BC Managed Care – PPO | Admitting: Family Medicine

## 2014-12-05 ENCOUNTER — Encounter: Payer: Self-pay | Admitting: Family Medicine

## 2014-12-05 VITALS — BP 125/82 | HR 81 | Temp 97.8°F | Ht 69.5 in | Wt 242.0 lb

## 2014-12-05 DIAGNOSIS — H6983 Other specified disorders of Eustachian tube, bilateral: Secondary | ICD-10-CM

## 2014-12-05 DIAGNOSIS — N400 Enlarged prostate without lower urinary tract symptoms: Secondary | ICD-10-CM

## 2014-12-05 DIAGNOSIS — E349 Endocrine disorder, unspecified: Secondary | ICD-10-CM

## 2014-12-05 DIAGNOSIS — J069 Acute upper respiratory infection, unspecified: Secondary | ICD-10-CM

## 2014-12-05 DIAGNOSIS — E119 Type 2 diabetes mellitus without complications: Secondary | ICD-10-CM

## 2014-12-05 DIAGNOSIS — E559 Vitamin D deficiency, unspecified: Secondary | ICD-10-CM

## 2014-12-05 DIAGNOSIS — E291 Testicular hypofunction: Secondary | ICD-10-CM

## 2014-12-05 DIAGNOSIS — Z0000 Encounter for general adult medical examination without abnormal findings: Secondary | ICD-10-CM

## 2014-12-05 DIAGNOSIS — I10 Essential (primary) hypertension: Secondary | ICD-10-CM

## 2014-12-05 DIAGNOSIS — E785 Hyperlipidemia, unspecified: Secondary | ICD-10-CM

## 2014-12-05 LAB — POCT CBC
Granulocyte percent: 64.4 %G (ref 37–80)
HCT, POC: 48.6 % (ref 43.5–53.7)
Hemoglobin: 15.6 g/dL (ref 14.1–18.1)
LYMPH, POC: 2 (ref 0.6–3.4)
MCH, POC: 25.5 pg — AB (ref 27–31.2)
MCHC: 32.1 g/dL (ref 31.8–35.4)
MCV: 79.4 fL — AB (ref 80–97)
MPV: 6.9 fL (ref 0–99.8)
PLATELET COUNT, POC: 241 10*3/uL (ref 142–424)
POC Granulocyte: 4.3 (ref 2–6.9)
POC LYMPH PERCENT: 29.3 %L (ref 10–50)
RBC: 6.1 M/uL (ref 4.69–6.13)
RDW, POC: 13 %
WBC: 6.7 10*3/uL (ref 4.6–10.2)

## 2014-12-05 LAB — POCT UA - MICROSCOPIC ONLY
BACTERIA, U MICROSCOPIC: NEGATIVE
CRYSTALS, UR, HPF, POC: NEGATIVE
Casts, Ur, LPF, POC: NEGATIVE
RBC, URINE, MICROSCOPIC: NEGATIVE
WBC, Ur, HPF, POC: NEGATIVE
Yeast, UA: NEGATIVE

## 2014-12-05 LAB — POCT URINALYSIS DIPSTICK
Bilirubin, UA: NEGATIVE
KETONES UA: NEGATIVE
Leukocytes, UA: NEGATIVE
Nitrite, UA: NEGATIVE
PH UA: 5
PROTEIN UA: NEGATIVE
SPEC GRAV UA: 1.015
Urobilinogen, UA: NEGATIVE

## 2014-12-05 LAB — POCT UA - MICROALBUMIN: Microalbumin Ur, POC: NEGATIVE mg/L

## 2014-12-05 MED ORDER — TESTOSTERONE 20.25 MG/1.25GM (1.62%) TD GEL: 2.0000 "application " | Freq: Every day | TRANSDERMAL | Status: DC

## 2014-12-05 MED ORDER — GLUCOSE BLOOD VI STRP: ORAL_STRIP | Status: DC

## 2014-12-05 MED ORDER — AMOXICILLIN-POT CLAVULANATE 875-125 MG PO TABS: 1.0000 | ORAL_TABLET | Freq: Two times a day (BID) | ORAL | Status: DC

## 2014-12-05 NOTE — Patient Instructions (Addendum)
Continue current medications. Continue good therapeutic lifestyle changes which include good diet and exercise. Fall precautions discussed with patient. If an FOBT was given today- please return it to our front desk. If you are over 62 years old - you may need Prevnar 92 or the adult Pneumonia vaccine.  Flu Shots will be available at our office starting mid- September. Please call and schedule a FLU CLINIC APPOINTMENT.   Continue to monitor blood sugar and feet regularly Continue to work on losing weight Drink plenty of fluids The patient should get an eye exam yearly

## 2014-12-05 NOTE — Progress Notes (Signed)
Subjective:    Patient ID: Richard Nelson., male    DOB: 10-31-1951, 63 y.o.   MRN: [id number]  HPI Patient is here today for annual wellness exam and follow up of chronic medical problems. The patient complains of sinus pressure and arthralgias in his left foot. It is significant to note that his younger brother has just been diagnosed with a brain tumor. His youngest brother has insulin-dependent diabetes. His father has a history of heart disease. His body mass index is 35.         Patient Active Problem List   Diagnosis Date Noted  . Anxiety states   . Depressive disorder, not elsewhere classified   . Hemorrhage of rectum and anus   . Polyp of colon   . Hyperplasia of prostate   . DM (diabetes mellitus)   . 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 OBESITY, UNSPECIFIED 01/09/2011  . PRECORDIAL PAIN 01/09/2011  . HYPERLIPIDEMIA 01/07/2011  . TMJ PAIN 01/07/2011   Outpatient Encounter Prescriptions as of 12/05/2014  Medication Sig  . aspirin 81 MG EC tablet Take 81 mg by mouth daily.    . enalapril (VASOTEC) 5 MG tablet TAKE 1 TABLET BY MOUTH DAILY  . glucose blood test strip Use to check BG twice a day.  Dx:  250.02  . INVOKAMET [phone] MG TABS TAKE 1 TABLET BY MOUTH 2 (TWO) TIMES DAILY WITH A MEAL.  Marland Kitchen omega-3 acid ethyl esters (LOVAZA) 1 G capsule TAKE 2 CAPSULES BY MOUTH TWICE A DAY  . ONETOUCH DELICA LANCETS 75T MISC Test BS bid. Dx E11.9  . simvastatin (ZOCOR) 20 MG tablet TAKE 1 TABLET (20 MG TOTAL) BY MOUTH AT BEDTIME.  Marland Kitchen Vitamin D, Ergocalciferol, (DRISDOL) 50000 UNITS CAPS capsule TAKE 1 CAPSULE (50,000 UNITS TOTAL) BY MOUTH EVERY 7 (SEVEN) DAYS.  . [DISCONTINUED] Cholecalciferol (VITAMIN D3) 3000 UNITS TABS Take 1 capsule by mouth daily.    Review of Systems  Constitutional: Negative.   HENT: Positive for sinus pressure.   Eyes: Negative.   Respiratory: Negative.   Cardiovascular: Negative.   Gastrointestinal: Negative.   Endocrine: Negative.     Genitourinary: Negative.   Musculoskeletal: Positive for arthralgias (left foot pain).  Skin: Negative.   Allergic/Immunologic: Negative.   Neurological: Negative.   Hematological: Negative.   Psychiatric/Behavioral: Negative.        Objective:   Physical Exam  Constitutional: He is oriented to person, place, and time. He appears well-developed and well-nourished. No distress.  The patient is alert and in no acute distress.  HENT:  Head: Normocephalic and atraumatic.  Right Ear: External ear normal.  Left Ear: External ear normal.  Nose: Nose normal.  Mouth/Throat: Oropharynx is clear and moist. No oropharyngeal exudate.  There is some nasal congestion bilaterally  Eyes: Conjunctivae and EOM are normal. Pupils are equal, round, and reactive to light. Right eye exhibits no discharge. Left eye exhibits no discharge. No scleral icterus.  Neck: Normal range of motion. Neck supple. No thyromegaly present.  No anterior cervical nodes and no carotid bruits  Cardiovascular: Normal rate, regular rhythm, normal heart sounds and intact distal pulses.  Exam reveals no gallop and no friction rub.   No murmur heard. The heart has a regular rate and rhythm at 72/m  Pulmonary/Chest: Effort normal and breath sounds normal. No respiratory distress. He has no wheezes. He has no rales. He exhibits no tenderness.  Dry cough  Abdominal: Soft. Bowel sounds are normal. He exhibits  no mass. There is no tenderness. There is no rebound and no guarding.  Obesity without masses or tenderness  Genitourinary: Rectum normal and penis normal.  The prostate is slightly enlarged but smooth and without masses. There are no rectal masses. The external genitalia are within normal limits and no hernia was palpated. There are no inguinal nodes.  Musculoskeletal: Normal range of motion. He exhibits no edema or tenderness.  Lymphadenopathy:    He has no cervical adenopathy.  Neurological: He is alert and oriented to  person, place, and time. He has normal reflexes. No cranial nerve deficit.  Skin: Skin is warm and dry. No rash noted. No erythema. No pallor.  Psychiatric: He has a normal mood and affect. His behavior is normal. Judgment and thought content normal.  Nursing note and vitals reviewed.  BP 125/82 mmHg  Pulse 81  Temp(Src) 97.8 F (36.6 C) (Oral)  Ht 5' 9.5" (1.765 m)  Wt 242 lb (109.77 kg)  BMI 35.24 kg/m2        Assessment & Plan:  1. Essential hypertension, benign - POCT CBC - BMP8+EGFR - Hepatic function panel  2. Type 2 diabetes mellitus without complication - POCT CBC - POCT UA - Microalbumin  3. Annual physical exam - POCT CBC - BMP8+EGFR - Hepatic function panel - PSA, total and free - NMR, lipoprofile - POCT UA - Microalbumin - POCT UA - Microscopic Only - POCT urinalysis dipstick - Thyroid Panel With TSH - Vit D  25 hydroxy (rtn osteoporosis monitoring) - Testosterone,Free and Total  4. BPH (benign prostatic hyperplasia) - POCT CBC - PSA, total and free - POCT UA - Microscopic Only - POCT urinalysis dipstick - Testosterone,Free and Total  5. Hyperlipidemia - POCT CBC - NMR, lipoprofile  6. Testosterone deficiency - POCT CBC - Testosterone,Free and Total  7. Vitamin D deficiency - POCT CBC - Vit D  25 hydroxy (rtn osteoporosis monitoring)  Meds ordered this encounter  Medications  . glucose blood test strip    Sig: Use to check BG twice a day.  Dx:  250.02    Dispense:  100 each    Refill:  5   Patient Instructions  Continue current medications. Continue good therapeutic lifestyle changes which include good diet and exercise. Fall precautions discussed with patient. If an FOBT was given today- please return it to our front desk. If you are over 80 years old - you may need Prevnar 37 or the adult Pneumonia vaccine.  Flu Shots will be available at our office starting mid- September. Please call and schedule a FLU CLINIC  APPOINTMENT.   Continue to monitor blood sugar and feet regularly Continue to work on losing weight Drink plenty of fluids The patient should get an eye exam yearly   Arrie Senate MD

## 2014-12-06 LAB — BMP8+EGFR
BUN / CREAT RATIO: 13 (ref 10–22)
BUN: 10 mg/dL (ref 8–27)
CO2: 22 mmol/L (ref 18–29)
CREATININE: 0.77 mg/dL (ref 0.76–1.27)
Calcium: 9.5 mg/dL (ref 8.6–10.2)
Chloride: 103 mmol/L (ref 97–108)
GFR calc Af Amer: 112 mL/min/{1.73_m2} (ref >59)
GFR, EST NON AFRICAN AMERICAN: 97 mL/min/{1.73_m2} (ref >59)
Glucose: 129 mg/dL — ABNORMAL HIGH (ref 65–99)
Potassium: 4.2 mmol/L (ref 3.5–5.2)
SODIUM: 141 mmol/L (ref 134–144)

## 2014-12-06 LAB — NMR, LIPOPROFILE
CHOLESTEROL: 107 mg/dL (ref 100–199)
HDL Cholesterol by NMR: 33 mg/dL — ABNORMAL LOW (ref >39)
HDL Particle Number: 30.1 umol/L — ABNORMAL LOW (ref >=30.5)
LDL PARTICLE NUMBER: 736 nmol/L (ref <1000)
LDL SIZE: 19.8 nm (ref >20.5)
LDL-C: 46 mg/dL (ref 0–99)
LP-IR Score: 63 — ABNORMAL HIGH (ref <=45)
Small LDL Particle Number: 558 nmol/L — ABNORMAL HIGH (ref <=527)
TRIGLYCERIDES BY NMR: 140 mg/dL (ref 0–149)

## 2014-12-06 LAB — HEPATIC FUNCTION PANEL
ALBUMIN: 4.4 g/dL (ref 3.6–4.8)
ALT: 31 IU/L (ref 0–44)
AST: 23 IU/L (ref 0–40)
Alkaline Phosphatase: 78 IU/L (ref 39–117)
BILIRUBIN TOTAL: 0.4 mg/dL (ref 0.0–1.2)
Bilirubin, Direct: 0.1 mg/dL (ref 0.00–0.40)
Total Protein: 6.3 g/dL (ref 6.0–8.5)

## 2014-12-06 LAB — TESTOSTERONE,FREE AND TOTAL
TESTOSTERONE FREE: 5.5 pg/mL — AB (ref 6.6–18.1)
Testosterone: 229 ng/dL — ABNORMAL LOW (ref 348–1197)

## 2014-12-06 LAB — THYROID PANEL WITH TSH
FREE THYROXINE INDEX: 2.6 (ref 1.2–4.9)
T3 Uptake Ratio: 30 % (ref 24–39)
T4, Total: 8.8 ug/dL (ref 4.5–12.0)
TSH: 1.53 u[IU]/mL (ref 0.450–4.500)

## 2014-12-06 LAB — VITAMIN D 25 HYDROXY (VIT D DEFICIENCY, FRACTURES): VIT D 25 HYDROXY: 37.9 ng/mL (ref 30.0–100.0)

## 2014-12-06 LAB — PSA, TOTAL AND FREE
PSA, Free Pct: 22.2 %
PSA, Free: 0.2 ng/mL
PSA: 0.9 ng/mL (ref 0.0–4.0)

## 2015-01-02 ENCOUNTER — Other Ambulatory Visit: Payer: BC Managed Care – PPO

## 2015-01-02 DIAGNOSIS — Z1212 Encounter for screening for malignant neoplasm of rectum: Secondary | ICD-10-CM

## 2015-01-04 LAB — FECAL OCCULT BLOOD, IMMUNOCHEMICAL: Fecal Occult Bld: NEGATIVE

## 2015-01-20 ENCOUNTER — Other Ambulatory Visit: Payer: Self-pay | Admitting: Family Medicine

## 2015-02-14 ENCOUNTER — Other Ambulatory Visit: Payer: Self-pay | Admitting: Family Medicine

## 2015-03-07 ENCOUNTER — Other Ambulatory Visit: Payer: Self-pay | Admitting: Family Medicine

## 2015-03-14 ENCOUNTER — Other Ambulatory Visit: Payer: Self-pay | Admitting: Family Medicine

## 2015-03-14 NOTE — Telephone Encounter (Signed)
lmtcb

## 2015-03-14 NOTE — Telephone Encounter (Signed)
Last seen 21/21/15 DWM  Last VIT D 12/05/14  37.9 normal

## 2015-03-14 NOTE — Telephone Encounter (Signed)
His prescription he may be okayed 1. Patient needs to appointment to have his vitamin D level rechecked

## 2015-04-13 ENCOUNTER — Other Ambulatory Visit: Payer: Self-pay | Admitting: Family Medicine

## 2015-04-15 ENCOUNTER — Other Ambulatory Visit: Payer: Self-pay | Admitting: Family Medicine

## 2015-04-17 NOTE — Telephone Encounter (Signed)
Last A1c was 02/2014

## 2015-04-18 ENCOUNTER — Ambulatory Visit: Payer: BC Managed Care – HMO | Admitting: Family Medicine

## 2015-05-29 ENCOUNTER — Encounter (INDEPENDENT_AMBULATORY_CARE_PROVIDER_SITE_OTHER): Payer: Self-pay

## 2015-05-29 ENCOUNTER — Ambulatory Visit (INDEPENDENT_AMBULATORY_CARE_PROVIDER_SITE_OTHER): Payer: BLUE CROSS/BLUE SHIELD | Admitting: Family Medicine

## 2015-05-29 ENCOUNTER — Encounter: Payer: Self-pay | Admitting: Family Medicine

## 2015-05-29 VITALS — BP 134/87 | HR 75 | Temp 97.8°F | Ht 69.5 in | Wt 246.0 lb

## 2015-05-29 DIAGNOSIS — E785 Hyperlipidemia, unspecified: Secondary | ICD-10-CM

## 2015-05-29 DIAGNOSIS — N400 Enlarged prostate without lower urinary tract symptoms: Secondary | ICD-10-CM | POA: Diagnosis not present

## 2015-05-29 DIAGNOSIS — I10 Essential (primary) hypertension: Secondary | ICD-10-CM | POA: Diagnosis not present

## 2015-05-29 DIAGNOSIS — E349 Endocrine disorder, unspecified: Secondary | ICD-10-CM

## 2015-05-29 DIAGNOSIS — E291 Testicular hypofunction: Secondary | ICD-10-CM | POA: Diagnosis not present

## 2015-05-29 DIAGNOSIS — E119 Type 2 diabetes mellitus without complications: Secondary | ICD-10-CM | POA: Diagnosis not present

## 2015-05-29 DIAGNOSIS — E559 Vitamin D deficiency, unspecified: Secondary | ICD-10-CM | POA: Diagnosis not present

## 2015-05-29 LAB — POCT URINALYSIS DIPSTICK
Bilirubin, UA: NEGATIVE
Blood, UA: NEGATIVE
Glucose, UA: 100
Ketones, UA: NEGATIVE
LEUKOCYTES UA: NEGATIVE
Nitrite, UA: NEGATIVE
PH UA: 5
Protein, UA: NEGATIVE
Spec Grav, UA: 1.015
Urobilinogen, UA: NEGATIVE

## 2015-05-29 LAB — POCT UA - MICROSCOPIC ONLY
Bacteria, U Microscopic: NEGATIVE
Casts, Ur, LPF, POC: NEGATIVE
Crystals, Ur, HPF, POC: NEGATIVE
MUCUS UA: NEGATIVE
RBC, URINE, MICROSCOPIC: NEGATIVE
WBC, UR, HPF, POC: NEGATIVE
Yeast, UA: NEGATIVE

## 2015-05-29 LAB — POCT CBC
Granulocyte percent: 54.9 %G (ref 37–80)
HCT, POC: 49.1 % (ref 43.5–53.7)
HEMOGLOBIN: 15.3 g/dL (ref 14.1–18.1)
Lymph, poc: 2 (ref 0.6–3.4)
MCH: 24.7 pg — AB (ref 27–31.2)
MCHC: 31.1 g/dL — AB (ref 31.8–35.4)
MCV: 79.4 fL — AB (ref 80–97)
MPV: 7.5 fL (ref 0–99.8)
PLATELET COUNT, POC: 210 10*3/uL (ref 142–424)
POC Granulocyte: 3 (ref 2–6.9)
POC LYMPH %: 37.5 % (ref 10–50)
RBC: 6.19 M/uL — AB (ref 4.69–6.13)
RDW, POC: 12.9 %
WBC: 5.4 10*3/uL (ref 4.6–10.2)

## 2015-05-29 LAB — POCT GLYCOSYLATED HEMOGLOBIN (HGB A1C): Hemoglobin A1C: 7.6

## 2015-05-29 MED ORDER — ENALAPRIL MALEATE 5 MG PO TABS: 5.0000 mg | ORAL_TABLET | Freq: Every day | ORAL | Status: DC

## 2015-05-29 MED ORDER — CANAGLIFLOZIN-METFORMIN HCL 150-1000 MG PO TABS: ORAL_TABLET | ORAL | Status: DC

## 2015-05-29 MED ORDER — TESTOSTERONE 20.25 MG/1.25GM (1.62%) TD GEL: 2.0000 "application " | Freq: Every day | TRANSDERMAL | Status: DC

## 2015-05-29 MED ORDER — OMEGA-3-ACID ETHYL ESTERS 1 G PO CAPS: 2.0000 | ORAL_CAPSULE | Freq: Two times a day (BID) | ORAL | Status: DC

## 2015-05-29 MED ORDER — SIMVASTATIN 20 MG PO TABS: ORAL_TABLET | ORAL | Status: DC

## 2015-05-29 NOTE — Patient Instructions (Addendum)
Continue current medications. Continue good therapeutic lifestyle changes which include good diet and exercise. Fall precautions discussed with patient. If an FOBT was given today- please return it to our front desk. If you are over 63 years old - you may need Prevnar 78 or the adult Pneumonia vaccine.  Flu Shots are still available at our office. If you still haven't had one please call to set up a nurse visit to get one.   After your visit with [place] today you will receive a survey in the mail or online from [employer] regarding your care with [place]. Please take a moment to fill this out. Your feedback is very important to [place] as you can help [place] better understand your patient needs as well as improve your experience and satisfaction. WE CARE ABOUT YOU!!!   The patient should check with his insurance regarding the Prevnar vaccine He should continue an aggressive diet with weight loss He should monitor his blood sugars closely at home He should bring blood sugars to each office visit for review Work on weight loss aggressively The next colonoscopy will be due in July 2019

## 2015-05-29 NOTE — Progress Notes (Signed)
Subjective:    Patient ID: Richard Nelson., male    DOB: [birth year], 64 y.o.   MRN: [id number]  HPI Pt here for follow up and management of chronic medical problems which includes hypertension, hyperlipidemia, and diabetes. He is taking medications regularly. This patient is doing well overall. He recently lost a younger brother to a brain tumor. This patient has testosterone deficiency but he has not been using his AndroGel regularly. He is satisfied with his sex life. He also has diabetes and his blood sugars at home and been running in the 160-180 range when he checks them and that is not very often. He has not had an eye exam for 2 years. He is due to get a Prevnar vaccine. He will get lab work done today and we will call him with the results. He denies chest pain, shortness of breath, any problems with his GI tract including blood in the stool or black tarry bowel movements. He is passing his water without problems.       Patient Active Problem List   Diagnosis Date Noted  . Anxiety states   . Depressive disorder, not elsewhere classified   . Hemorrhage of rectum and anus   . Polyp of colon   . Hyperplasia of prostate   . DM (diabetes mellitus)   . 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 Obesity 01/09/2011  . PRECORDIAL PAIN 01/09/2011  . HYPERLIPIDEMIA 01/07/2011  . TMJ PAIN 01/07/2011   Outpatient Encounter Prescriptions as of 05/29/2015  Medication Sig  . aspirin 81 MG EC tablet Take 81 mg by mouth daily.    . enalapril (VASOTEC) 5 MG tablet TAKE 1 TABLET BY MOUTH DAILY  . glucose blood test strip Use to check BG twice a day.  Dx:  250.02  . INVOKAMET [phone] MG TABS TAKE 1 TABLET BY MOUTH 2 (TWO) TIMES DAILY WITH A MEAL.  Marland Kitchen omega-3 acid ethyl esters (LOVAZA) 1 G capsule TAKE 2 CAPSULES BY MOUTH TWICE A DAY  . ONETOUCH DELICA LANCETS 64P MISC Test BS bid. Dx E11.9  . simvastatin (ZOCOR) 20 MG tablet TAKE 1 TABLET (20 MG TOTAL) BY MOUTH AT BEDTIME.  Marland Kitchen  Testosterone (ANDROGEL) 20.25 MG/1.25GM (1.62%) GEL Place 2 application onto the skin daily.  . Vitamin D, Ergocalciferol, (DRISDOL) 50000 UNITS CAPS capsule TAKE 1 CAPSULE (50,000 UNITS TOTAL) BY MOUTH EVERY 7 (SEVEN) DAYS.  . [DISCONTINUED] amoxicillin-clavulanate (AUGMENTIN) 875-125 MG per tablet Take 1 tablet by mouth 2 (two) times daily.   No facility-administered encounter medications on file as of 05/29/2015.     Review of Systems  Constitutional: Negative.   HENT: Negative.   Eyes: Positive for itching (allergies).  Respiratory: Negative.   Cardiovascular: Negative.   Gastrointestinal: Negative.   Endocrine: Negative.   Genitourinary: Negative.   Musculoskeletal: Positive for back pain (muscles in back hurt).  Skin: Negative.   Allergic/Immunologic: Negative.   Neurological: Negative.   Hematological: Negative.   Psychiatric/Behavioral: Negative.        Objective:   Physical Exam  Constitutional: He is oriented to person, place, and time. He appears well-developed and well-nourished. No distress.  The patient is alert and only complains of some minor back pain.  HENT:  Head: Normocephalic and atraumatic.  Right Ear: External ear normal.  Left Ear: External ear normal.  Nose: Nose normal.  Mouth/Throat: Oropharynx is clear and moist. No oropharyngeal exudate.  Eyes: Conjunctivae and EOM are normal. Pupils are equal, round, and  reactive to light. Right eye exhibits no discharge. Left eye exhibits no discharge. No scleral icterus.  Neck: Normal range of motion. Neck supple. No thyromegaly present.  No carotid bruits anterior cervical adenopathy or thyromegaly  Cardiovascular: Normal rate, regular rhythm, normal heart sounds and intact distal pulses.   No murmur heard. The rhythm is regular at 72/m  Pulmonary/Chest: Effort normal and breath sounds normal. No respiratory distress. He has no wheezes. He has no rales. He exhibits no tenderness.  Lungs are clear anteriorly  and posteriorly  Abdominal: Soft. Bowel sounds are normal. He exhibits no mass. There is no tenderness. There is no rebound and no guarding.  Abdomen is obese without masses tenderness or organ enlargement  Genitourinary: Rectum normal, prostate normal and penis normal.  The prostate is slightly enlarged without any lumps or nodules. The rectal exam was negative. There were no inguinal hernias bilaterally. External genitalia were normal. There was a small noninflamed hemorrhoid.  Musculoskeletal: Normal range of motion. He exhibits no edema or tenderness.  Lymphadenopathy:    He has no cervical adenopathy.  Neurological: He is alert and oriented to person, place, and time. He has normal reflexes. No cranial nerve deficit.  Skin: Skin is warm and dry. No erythema. No pallor.  Psychiatric: He has a normal mood and affect. His behavior is normal. Judgment and thought content normal.  Nursing note and vitals reviewed.  BP 134/87 mmHg  Pulse 75  Temp(Src) 97.8 F (36.6 C) (Oral)  Ht 5' 9.5" (1.765 m)  Wt 246 lb (111.585 kg)  BMI 35.82 kg/m2        Assessment & Plan:  1. Type 2 diabetes mellitus without complication -The patient will get a hemoglobin A1c today and he should continue with current treatment pending results of lab work - POCT glycosylated hemoglobin (Hb A1C) - POCT CBC  2. Essential hypertension, benign -The blood pressure is under good control he will continue with current treatment in addition to watching sodium intake - POCT CBC - BMP8+EGFR - Hepatic function panel  3. BPH (benign prostatic hyperplasia) -The prostate is enlarged but soft and smooth and there are no rectal masses. - POCT CBC - PSA, total and free - POCT urinalysis dipstick - POCT UA - Microscopic Only  4. Hyperlipidemia -He should continue with his simvastatin pending the results of the cholesterol blood work that he is getting to the y - POCT CBC - NMR, lipoprofile  5. Testosterone  deficiency -The patient is currently using AndroGel but not regularly and he is satisfied with the way he is feeling. - POCT CBC - Testosterone,Free and Total - POCT urinalysis dipstick - POCT UA - Microscopic Only  6. Vitamin D deficiency -The patient should continue with his current vitamin D dose which is 50,000 weekly - POCT CBC - Vit D  25 hydroxy (rtn osteoporosis monitoring)  Meds ordered this encounter  Medications  . Testosterone (ANDROGEL) 20.25 MG/1.25GM (1.62%) GEL    Sig: Place 2 application onto the skin daily.    Dispense:  1.25 g    Refill:  3  . simvastatin (ZOCOR) 20 MG tablet    Sig: TAKE 1 TABLET (20 MG TOTAL) BY MOUTH AT BEDTIME.    Dispense:  90 tablet    Refill:  3  . omega-3 acid ethyl esters (LOVAZA) 1 G capsule    Sig: Take 2 capsules (2 g total) by mouth 2 (two) times daily.    Dispense:  360 capsule  Refill:  3  . enalapril (VASOTEC) 5 MG tablet    Sig: Take 1 tablet (5 mg total) by mouth daily.    Dispense:  90 tablet    Refill:  3  . Canagliflozin-Metformin HCl (INVOKAMET) [phone] MG TABS    Sig: TAKE 1 TABLET BY MOUTH 2 (TWO) TIMES DAILY WITH A MEAL.    Dispense:  60 tablet    Refill:  3   Patient Instructions  Continue current medications. Continue good therapeutic lifestyle changes which include good diet and exercise. Fall precautions discussed with patient. If an FOBT was given today- please return it to our front desk. If you are over 71 years old - you may need Prevnar 2 or the adult Pneumonia vaccine.  Flu Shots are still available at our office. If you still haven't had one please call to set up a nurse visit to get one.   After your visit with [place] today you will receive a survey in the mail or online from [employer] regarding your care with [place]. Please take a moment to fill this out. Your feedback is very important to [place] as you can help [place] better understand your patient needs as well as improve your experience and satisfaction. WE  CARE ABOUT YOU!!!   The patient should check with his insurance regarding the Prevnar vaccine He should continue an aggressive diet with weight loss He should monitor his blood sugars closely at home He should bring blood sugars to each office visit for review Work on weight loss aggressively The next colonoscopy will be due in July 2019   Arrie Senate MD

## 2015-05-30 ENCOUNTER — Telehealth: Payer: Self-pay

## 2015-05-30 LAB — PSA, TOTAL AND FREE
PROSTATE SPECIFIC AG, SERUM: 0.6 ng/mL (ref 0.0–4.0)
PSA FREE PCT: 26.7 %
PSA FREE: 0.16 ng/mL

## 2015-05-30 LAB — NMR, LIPOPROFILE
CHOLESTEROL: 197 mg/dL (ref 100–199)
HDL Cholesterol by NMR: 41 mg/dL (ref >39)
HDL PARTICLE NUMBER: 31.7 umol/L (ref >=30.5)
LDL PARTICLE NUMBER: 1623 nmol/L — AB (ref <1000)
LDL SIZE: 20.3 nm (ref >20.5)
LDL-C: 126 mg/dL — ABNORMAL HIGH (ref 0–99)
LP-IR Score: 71 — ABNORMAL HIGH (ref <=45)
SMALL LDL PARTICLE NUMBER: 931 nmol/L — AB (ref <=527)
Triglycerides by NMR: 149 mg/dL (ref 0–149)

## 2015-05-30 LAB — BMP8+EGFR
BUN / CREAT RATIO: 14 (ref 10–22)
BUN: 11 mg/dL (ref 8–27)
CO2: 22 mmol/L (ref 18–29)
Calcium: 9.5 mg/dL (ref 8.6–10.2)
Chloride: 104 mmol/L (ref 97–108)
Creatinine, Ser: 0.78 mg/dL (ref 0.76–1.27)
GFR calc Af Amer: 112 mL/min/{1.73_m2} (ref >59)
GFR calc non Af Amer: 97 mL/min/{1.73_m2} (ref >59)
Glucose: 156 mg/dL — ABNORMAL HIGH (ref 65–99)
POTASSIUM: 4.4 mmol/L (ref 3.5–5.2)
Sodium: 144 mmol/L (ref 134–144)

## 2015-05-30 LAB — HEPATIC FUNCTION PANEL
ALT: 29 IU/L (ref 0–44)
AST: 19 IU/L (ref 0–40)
Albumin: 4.4 g/dL (ref 3.6–4.8)
Alkaline Phosphatase: 87 IU/L (ref 39–117)
BILIRUBIN, DIRECT: 0.11 mg/dL (ref 0.00–0.40)
Bilirubin Total: 0.3 mg/dL (ref 0.0–1.2)
TOTAL PROTEIN: 6.5 g/dL (ref 6.0–8.5)

## 2015-05-30 LAB — TESTOSTERONE,FREE AND TOTAL
Testosterone, Free: 9.9 pg/mL (ref 6.6–18.1)
Testosterone: 235 ng/dL — ABNORMAL LOW (ref 348–1197)

## 2015-05-30 LAB — VITAMIN D 25 HYDROXY (VIT D DEFICIENCY, FRACTURES): Vit D, 25-Hydroxy: 23.7 ng/mL — ABNORMAL LOW (ref 30.0–100.0)

## 2015-05-30 NOTE — Telephone Encounter (Signed)
INsurance approved Omega-3 1 gm  [id number]  04/30/15 to 05/29/16

## 2015-06-01 ENCOUNTER — Other Ambulatory Visit: Payer: Self-pay | Admitting: *Deleted

## 2015-06-01 ENCOUNTER — Telehealth: Payer: Self-pay | Admitting: Family Medicine

## 2015-06-01 MED ORDER — ROSUVASTATIN CALCIUM 10 MG PO TABS: 10.0000 mg | ORAL_TABLET | Freq: Every day | ORAL | Status: DC

## 2015-06-01 MED ORDER — VITAMIN D (ERGOCALCIFEROL) 1.25 MG (50000 UNIT) PO CAPS: ORAL_CAPSULE | ORAL | Status: DC

## 2015-06-01 NOTE — Telephone Encounter (Signed)
Pt called and aware of labs  

## 2015-06-04 ENCOUNTER — Other Ambulatory Visit: Payer: Self-pay | Admitting: Family Medicine

## 2015-07-06 ENCOUNTER — Encounter: Payer: Self-pay | Admitting: *Deleted

## 2015-07-11 ENCOUNTER — Encounter: Payer: Self-pay | Admitting: Physician Assistant

## 2015-07-11 ENCOUNTER — Ambulatory Visit (INDEPENDENT_AMBULATORY_CARE_PROVIDER_SITE_OTHER): Payer: BLUE CROSS/BLUE SHIELD | Admitting: Physician Assistant

## 2015-07-11 ENCOUNTER — Encounter (INDEPENDENT_AMBULATORY_CARE_PROVIDER_SITE_OTHER): Payer: Self-pay

## 2015-07-11 VITALS — BP 142/89 | HR 80 | Temp 97.7°F | Ht 69.5 in | Wt 248.2 lb

## 2015-07-11 DIAGNOSIS — J069 Acute upper respiratory infection, unspecified: Secondary | ICD-10-CM

## 2015-07-11 MED ORDER — AMOXICILLIN 875 MG PO TABS: 875.0000 mg | ORAL_TABLET | Freq: Two times a day (BID) | ORAL | Status: DC

## 2015-07-11 NOTE — Patient Instructions (Signed)
Continue flonase, plain musinex If you can take Benadryl every 6 hours, if you are too drowsy - take [employer]

## 2015-07-11 NOTE — Progress Notes (Signed)
   Subjective:    Patient ID: Richard Nelson., male    DOB: 07-27-1951, 64 y.o.   MRN: [id number]  HPI 64 y/o male presents with c/o right ear pain x 1 day, scratchy throat. He has pain in left ear today. Tried allegra once today and Musinex. He is not sure if medications helped. Wife had similar symptoms and was treated with antibiotic.     Review of Systems  Constitutional: Negative for fever, chills, diaphoresis and fatigue.  HENT: Positive for congestion (nasal ), ear pain (bilateral ), postnasal drip, rhinorrhea and sore throat.   Eyes: Positive for discharge and itching.  Respiratory: Negative for cough, shortness of breath and wheezing.   Cardiovascular: Negative.   Gastrointestinal: Negative.   Endocrine: Negative.   Genitourinary: Negative.   Musculoskeletal: Negative.   Skin: Negative.   Allergic/Immunologic: Negative.   Neurological: Positive for headaches (frontal area ).  Hematological: Negative.   Psychiatric/Behavioral: Negative.        Objective:   Physical Exam  Constitutional: He appears well-developed and well-nourished. No distress.  HENT:  Head: Normocephalic.  Right Ear: External ear normal.  Left Ear: External ear normal.  Posterior pharynx erythema and injection Bilateral tonsillar hypertrophy   Eyes: Pupils are equal, round, and reactive to light.  Cardiovascular: Normal rate, regular rhythm, normal heart sounds and intact distal pulses.  Exam reveals no gallop and no friction rub.   No murmur heard. Pulmonary/Chest: Effort normal and breath sounds normal. No respiratory distress. He has no wheezes. He has no rales. He exhibits no tenderness.  Neurological: He is alert.  Skin: He is not diaphoretic.  Psychiatric: He has a normal mood and affect. His behavior is normal. Judgment and thought content normal.  Nursing note and vitals reviewed.         Assessment & Plan:  1. Acute upper respiratory infection  - amoxicillin (AMOXIL) 875 MG  tablet; Take 1 tablet (875 mg total) by mouth 2 (two) times daily.  Dispense: 20 tablet; Refill: 0  - continue plain musinex , flonase, benadry or allegra    RTO prn   Geoffry Bannister A. Benjamin Stain PA-C

## 2015-10-02 ENCOUNTER — Ambulatory Visit: Payer: BLUE CROSS/BLUE SHIELD | Admitting: Family Medicine

## 2015-10-03 ENCOUNTER — Ambulatory Visit (INDEPENDENT_AMBULATORY_CARE_PROVIDER_SITE_OTHER): Payer: BLUE CROSS/BLUE SHIELD | Admitting: Family Medicine

## 2015-10-03 ENCOUNTER — Encounter: Payer: Self-pay | Admitting: Family Medicine

## 2015-10-03 VITALS — BP 130/80 | HR 83 | Temp 98.4°F | Ht 69.5 in | Wt 245.0 lb

## 2015-10-03 DIAGNOSIS — M546 Pain in thoracic spine: Secondary | ICD-10-CM | POA: Diagnosis not present

## 2015-10-03 DIAGNOSIS — N400 Enlarged prostate without lower urinary tract symptoms: Secondary | ICD-10-CM | POA: Diagnosis not present

## 2015-10-03 DIAGNOSIS — E785 Hyperlipidemia, unspecified: Secondary | ICD-10-CM

## 2015-10-03 DIAGNOSIS — M542 Cervicalgia: Secondary | ICD-10-CM

## 2015-10-03 DIAGNOSIS — E291 Testicular hypofunction: Secondary | ICD-10-CM

## 2015-10-03 DIAGNOSIS — R002 Palpitations: Secondary | ICD-10-CM

## 2015-10-03 DIAGNOSIS — E559 Vitamin D deficiency, unspecified: Secondary | ICD-10-CM | POA: Diagnosis not present

## 2015-10-03 DIAGNOSIS — E119 Type 2 diabetes mellitus without complications: Secondary | ICD-10-CM | POA: Diagnosis not present

## 2015-10-03 DIAGNOSIS — I10 Essential (primary) hypertension: Secondary | ICD-10-CM | POA: Diagnosis not present

## 2015-10-03 DIAGNOSIS — M549 Dorsalgia, unspecified: Secondary | ICD-10-CM

## 2015-10-03 DIAGNOSIS — E349 Endocrine disorder, unspecified: Secondary | ICD-10-CM

## 2015-10-03 NOTE — Progress Notes (Addendum)
Subjective:    Patient ID: Richard Nelson., male    DOB: [birth year], 64 y.o.   MRN: [id number]  HPI Pt here for follow up and management of chronic medical problems which includes diabetes, hyperlipidemia, and hypertension. He is taking medications regularly. The patient today does complain of palpitations at times. He does drink caffeine and soda drinks coffee and tea is also has some neck and shoulder pain and some tightness in his back. He's been under a lot of stress. He will return to the office for his fasting lab work. He will get an EKG today. His BMI is 35. Patient has a recent history of this neck and shoulder pain and tightness in his back. He says he is having it right now as we see. He also indicates he is been under a lot of stress with his family his parents losing a sibling and with his wife. This seems to make things worse with his back and the back discomfort is also relieved with taking ibuprofen. He is also noticed these palpitations and he says that he can count from and at 3 of these occurred yesterday and a. His blood sugars at home and been running in the 95 to 1:30 range and this is before meals and a couple of hours after meals. The patient denies any trouble with heartburn indigestion nausea vomiting diarrhea or blood in the stool. The exception is he does have occasional nausea. He is passing his water without problems. He also as indicated is under a lot of stress with family issues.        Patient Active Problem List   Diagnosis Date Noted  . Anxiety states   . Depressive disorder, not elsewhere classified   . Hemorrhage of rectum and anus   . Polyp of colon   . Hyperplasia of prostate   . DM (diabetes mellitus) ([place])   . 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 Obesity 01/09/2011  . PRECORDIAL PAIN 01/09/2011  . HYPERLIPIDEMIA 01/07/2011  . TMJ PAIN 01/07/2011   Outpatient Encounter Prescriptions as of 10/03/2015  Medication Sig  . aspirin 81  MG EC tablet Take 81 mg by mouth daily.    . Canagliflozin-Metformin HCl (INVOKAMET) [phone] MG TABS TAKE 1 TABLET BY MOUTH 2 (TWO) TIMES DAILY WITH A MEAL.  Marland Kitchen enalapril (VASOTEC) 5 MG tablet Take 1 tablet (5 mg total) by mouth daily.  Marland Kitchen glucose blood test strip Use to check BG twice a day.  Dx:  250.02  . omega-3 acid ethyl esters (LOVAZA) 1 G capsule Take 2 capsules (2 g total) by mouth 2 (two) times daily.  Glory Rosebush DELICA LANCETS 17P MISC Test BS bid. Dx E11.9  . rosuvastatin (CRESTOR) 10 MG tablet Take 1 tablet (10 mg total) by mouth daily.  . Testosterone (ANDROGEL) 20.25 MG/1.25GM (1.62%) GEL Place 2 application onto the skin daily.  . Vitamin D, Ergocalciferol, (DRISDOL) 50000 UNITS CAPS capsule TAKE 1 CAPSULE (50,000 UNITS TOTAL) BY MOUTH EVERY 7 (SEVEN) DAYS.  . [DISCONTINUED] amoxicillin (AMOXIL) 875 MG tablet Take 1 tablet (875 mg total) by mouth 2 (two) times daily.  . [DISCONTINUED] simvastatin (ZOCOR) 20 MG tablet TAKE 1 TABLET (20 MG TOTAL) BY MOUTH AT BEDTIME.   No facility-administered encounter medications on file as of 10/03/2015.     Review of Systems  Constitutional: Negative.   HENT: Negative.   Eyes: Negative.   Respiratory: Negative.   Cardiovascular: Positive for palpitations.  Gastrointestinal: Negative.  Endocrine: Negative.   Genitourinary: Negative.   Musculoskeletal: Positive for back pain, arthralgias and neck pain.  Skin: Negative.  Negative for pallor.  Allergic/Immunologic: Negative.   Neurological: Negative.   Hematological: Negative.   Psychiatric/Behavioral: Negative.        Stress       Objective:   Physical Exam  Constitutional: He is oriented to person, place, and time. He appears well-developed and well-nourished. No distress.  HENT:  Head: Normocephalic and atraumatic.  Right Ear: External ear normal.  Left Ear: External ear normal.  Mouth/Throat: Oropharynx is clear and moist. No oropharyngeal exudate.  There is some nasal  congestion bilaterally  Eyes: Conjunctivae and EOM are normal. Pupils are equal, round, and reactive to light. Right eye exhibits no discharge. Left eye exhibits no discharge. No scleral icterus.  Neck: Normal range of motion. Neck supple. No thyromegaly present.  There are no carotid bruits or anterior cervical adenopathy or thyromegaly  Cardiovascular: Normal rate, regular rhythm, normal heart sounds and intact distal pulses.   No murmur heard. The rhythm is regular at 72-84/m  Pulmonary/Chest: Effort normal and breath sounds normal. No respiratory distress. He has no wheezes. He has no rales. He exhibits no tenderness.  Clear anteriorly and posteriorly without axillary adenopathy  Abdominal: Soft. Bowel sounds are normal. He exhibits no mass. There is tenderness. There is no rebound and no guarding.  There is abdominal obesity. There was slight left upper quadrant tenderness without organ enlargement bruits or inguinal adenopathy  Musculoskeletal: Normal range of motion. He exhibits no edema or tenderness.  Lymphadenopathy:    He has no cervical adenopathy.  Neurological: He is alert and oriented to person, place, and time. He has normal reflexes. No cranial nerve deficit.  The lower extremity reflexes were slightly decreased on the right compared to the left  Skin: Skin is warm and dry. No rash noted.  Psychiatric: He has a normal mood and affect. His behavior is normal. Judgment and thought content normal.  Nursing note and vitals reviewed.  BP 130/80 mmHg  Pulse 83  Temp(Src) 98.4 F (36.9 C) (Oral)  Ht 5' 9.5" (1.765 m)  Wt 245 lb (111.131 kg)  BMI 35.67 kg/m2  EKG: normal EKG, normal sinus rhythm.       Assessment & Plan:  1. Type 2 diabetes mellitus without complication, without long-term current use of insulin ([place]) -Continue current treatment pending results of lab work - POCT glycosylated hemoglobin (Hb A1C); Future - BMP8+EGFR; Future - CBC with  Differential/Platelet; Future  2. Essential hypertension, benign -Blood pressure is good today he should continue with current treatment - BMP8+EGFR; Future - CBC with Differential/Platelet; Future - Hepatic function panel; Future - EKG 12-Lead  3. BPH (benign prostatic hyperplasia) -He is having no prostate symptoms or issues with passing his water. - CBC with Differential/Platelet; Future  4. Hyperlipidemia -Continue with current treatment pending results of lab work - CBC with Differential/Platelet; Future - NMR, lipoprofile; Future  5. Testosterone deficiency -Continue with testosterone replacement and rectal exam and PSA will be done in 4 months - CBC with Differential/Platelet; Future  6. Vitamin D deficiency -Continue with current treatment pending results of lab work - CBC with Differential/Platelet; Future - Vit D  25 hydroxy (rtn osteoporosis monitoring); Future  7. Palpitations -The patient will reduce and discontinue all caffeine intake and we will get the [facility] monitor done to try to identify the palpitations that he is having -He will also be referred  to the cardiologist because of the strong family history of heart disease in his personal history of hyperlipidemia obesity. - EKG 12-Lead - Cardiac event monitor  8. Neck pain on left side -The patient has seen an orthopedic surgeon about his neck issues and we will continue to monitor the neck pain but will evaluate the heart situation first  9. Upper back pain on left side -Take Tylenol as needed for pain and use warm wet compresses to the area   Patient Instructions  Continue current medications. Continue good therapeutic lifestyle changes which include good diet and exercise. Fall precautions discussed with patient. If an FOBT was given today- please return it to our front desk. If you are over 53 years old - you may need Prevnar 31 or the adult Pneumonia vaccine.  **Flu shots are available---  please call and schedule a FLU-CLINIC appointment**  After your visit with [place] today you will receive a survey in the mail or online from [employer] regarding your care with [place]. Please take a moment to fill this out. Your feedback is very important to [place] as you can help [place] better understand your patient needs as well as improve your experience and satisfaction. WE CARE ABOUT YOU!!!   The patient should wear the [facility] monitor and see if we can capture some of the events that have been occurring as far as his palpitations are concerned We will arrange for him to have an appointment with the cardiologist because of the strong family history of heart disease and his personal history of risk factors with obesity  diabetes and hyperlipidemia He should continue to work on his diet and eat as little sodium as possible He should also try to keep a record of his back pain and neck pain and shoulder pain and what they seem to be associated with. He should take Tylenol if needed for pain and avoid heavy lifting pushing or straining   Arrie Senate MD

## 2015-10-03 NOTE — Patient Instructions (Addendum)
Continue current medications. Continue good therapeutic lifestyle changes which include good diet and exercise. Fall precautions discussed with patient. If an FOBT was given today- please return it to our front desk. If you are over 63 years old - you may need Prevnar 67 or the adult Pneumonia vaccine.  **Flu shots are available--- please call and schedule a FLU-CLINIC appointment**  After your visit with [place] today you will receive a survey in the mail or online from [employer] regarding your care with [place]. Please take a moment to fill this out. Your feedback is very important to [place] as you can help [place] better understand your patient needs as well as improve your experience and satisfaction. WE CARE ABOUT YOU!!!   The patient should wear the [facility] monitor and see if we can capture some of the events that have been occurring as far as his palpitations are concerned We will arrange for him to have an appointment with the cardiologist because of the strong family history of heart disease and his personal history of risk factors with obesity  diabetes and hyperlipidemia He should continue to work on his diet and eat as little sodium as possible He should also try to keep a record of his back pain and neck pain and shoulder pain and what they seem to be associated with. He should take Tylenol if needed for pain and avoid heavy lifting pushing or straining

## 2015-10-21 ENCOUNTER — Other Ambulatory Visit (INDEPENDENT_AMBULATORY_CARE_PROVIDER_SITE_OTHER): Payer: BLUE CROSS/BLUE SHIELD

## 2015-10-21 DIAGNOSIS — E119 Type 2 diabetes mellitus without complications: Secondary | ICD-10-CM | POA: Diagnosis not present

## 2015-10-21 DIAGNOSIS — N400 Enlarged prostate without lower urinary tract symptoms: Secondary | ICD-10-CM

## 2015-10-21 DIAGNOSIS — I10 Essential (primary) hypertension: Secondary | ICD-10-CM

## 2015-10-21 DIAGNOSIS — E349 Endocrine disorder, unspecified: Secondary | ICD-10-CM

## 2015-10-21 DIAGNOSIS — E559 Vitamin D deficiency, unspecified: Secondary | ICD-10-CM

## 2015-10-21 DIAGNOSIS — E785 Hyperlipidemia, unspecified: Secondary | ICD-10-CM

## 2015-10-21 LAB — POCT GLYCOSYLATED HEMOGLOBIN (HGB A1C): Hemoglobin A1C: 7.7

## 2015-10-23 LAB — NMR, LIPOPROFILE
CHOLESTEROL: 122 mg/dL (ref 100–199)
HDL Cholesterol by NMR: 31 mg/dL — ABNORMAL LOW (ref >39)
HDL PARTICLE NUMBER: 26.2 umol/L — AB (ref >=30.5)
LDL Particle Number: 913 nmol/L (ref <1000)
LDL SIZE: 19.6 nm (ref >20.5)
LDL-C: 68 mg/dL (ref 0–99)
LP-IR Score: 60 — ABNORMAL HIGH (ref <=45)
SMALL LDL PARTICLE NUMBER: 783 nmol/L — AB (ref <=527)
TRIGLYCERIDES BY NMR: 114 mg/dL (ref 0–149)

## 2015-10-23 LAB — CBC WITH DIFFERENTIAL/PLATELET
BASOS ABS: 0 10*3/uL (ref 0.0–0.2)
Basos: 0 %
EOS (ABSOLUTE): 0.2 10*3/uL (ref 0.0–0.4)
Eos: 3 %
HEMOGLOBIN: 15.8 g/dL (ref 12.6–17.7)
Hematocrit: 47.5 % (ref 37.5–51.0)
IMMATURE GRANS (ABS): 0 10*3/uL (ref 0.0–0.1)
Immature Granulocytes: 0 %
LYMPHS: 30 %
Lymphocytes Absolute: 2 10*3/uL (ref 0.7–3.1)
MCH: 26.5 pg — AB (ref 26.6–33.0)
MCHC: 33.3 g/dL (ref 31.5–35.7)
MCV: 80 fL (ref 79–97)
MONOCYTES: 8 %
Monocytes Absolute: 0.5 10*3/uL (ref 0.1–0.9)
NEUTROS ABS: 3.9 10*3/uL (ref 1.4–7.0)
Neutrophils: 59 %
Platelets: 203 10*3/uL (ref 150–379)
RBC: 5.97 x10E6/uL — ABNORMAL HIGH (ref 4.14–5.80)
RDW: 14.2 % (ref 12.3–15.4)
WBC: 6.6 10*3/uL (ref 3.4–10.8)

## 2015-10-23 LAB — HEPATIC FUNCTION PANEL
ALBUMIN: 4.4 g/dL (ref 3.6–4.8)
ALK PHOS: 76 IU/L (ref 39–117)
ALT: 31 IU/L (ref 0–44)
AST: 26 IU/L (ref 0–40)
BILIRUBIN TOTAL: 0.6 mg/dL (ref 0.0–1.2)
BILIRUBIN, DIRECT: 0.18 mg/dL (ref 0.00–0.40)
TOTAL PROTEIN: 6.5 g/dL (ref 6.0–8.5)

## 2015-10-23 LAB — VITAMIN D 25 HYDROXY (VIT D DEFICIENCY, FRACTURES): VIT D 25 HYDROXY: 42.9 ng/mL (ref 30.0–100.0)

## 2015-10-23 LAB — BMP8+EGFR
BUN/Creatinine Ratio: 15 (ref 10–22)
BUN: 11 mg/dL (ref 8–27)
CALCIUM: 9.6 mg/dL (ref 8.6–10.2)
CHLORIDE: 100 mmol/L (ref 97–106)
CO2: 21 mmol/L (ref 18–29)
Creatinine, Ser: 0.72 mg/dL — ABNORMAL LOW (ref 0.76–1.27)
GFR, EST AFRICAN AMERICAN: 115 mL/min/{1.73_m2} (ref >59)
GFR, EST NON AFRICAN AMERICAN: 99 mL/min/{1.73_m2} (ref >59)
Glucose: 154 mg/dL — ABNORMAL HIGH (ref 65–99)
Potassium: 4.2 mmol/L (ref 3.5–5.2)
Sodium: 139 mmol/L (ref 136–144)

## 2015-10-27 ENCOUNTER — Telehealth: Payer: Self-pay | Admitting: Family Medicine

## 2015-10-27 NOTE — Telephone Encounter (Signed)
Reviewed labs with pt. 

## 2015-11-10 ENCOUNTER — Other Ambulatory Visit: Payer: Self-pay | Admitting: Family Medicine

## 2015-11-10 NOTE — Telephone Encounter (Signed)
Last seen 10/03/15  Last Vit D 10/21/15  42.9

## 2015-11-28 ENCOUNTER — Other Ambulatory Visit: Payer: Self-pay | Admitting: Family Medicine

## 2015-12-01 ENCOUNTER — Other Ambulatory Visit: Payer: Self-pay | Admitting: Family Medicine

## 2015-12-04 NOTE — Telephone Encounter (Signed)
Vit. D level was 42 last month

## 2016-01-23 ENCOUNTER — Telehealth: Payer: Self-pay

## 2016-01-23 NOTE — Telephone Encounter (Signed)
I'm not familiar with this medication either and Tammy isn't here today. Please advise

## 2016-01-23 NOTE — Telephone Encounter (Signed)
invokamet non preferred with his insurance   They will cover Xigduo XR

## 2016-01-23 NOTE — Telephone Encounter (Signed)
Please change to similar but covered medication------ I am not familiar with Xigduo XR?????

## 2016-01-24 MED ORDER — DAPAGLIFLOZIN PRO-METFORMIN ER 5-1000 MG PO TB24: 2.0000 | ORAL_TABLET | Freq: Every day | ORAL | Status: DC

## 2016-01-24 NOTE — Addendum Note (Signed)
Addended by: Cherre Robins on: 01/24/2016 11:31 AM   Modules accepted: Orders, Medications

## 2016-01-24 NOTE — Telephone Encounter (Signed)
This is the correct name of medications - it is also a SGLT2  tyep medications similar to Invokamet.  Rx has been send to [facility] and message left for patient about Rx.

## 2016-01-24 NOTE — Telephone Encounter (Signed)
Please reconfirm name of medication insurance will pay for with patient

## 2016-02-13 ENCOUNTER — Ambulatory Visit (INDEPENDENT_AMBULATORY_CARE_PROVIDER_SITE_OTHER): Payer: BLUE CROSS/BLUE SHIELD

## 2016-02-13 ENCOUNTER — Telehealth: Payer: Self-pay | Admitting: Family Medicine

## 2016-02-13 ENCOUNTER — Encounter: Payer: Self-pay | Admitting: Family Medicine

## 2016-02-13 ENCOUNTER — Ambulatory Visit (INDEPENDENT_AMBULATORY_CARE_PROVIDER_SITE_OTHER): Payer: BLUE CROSS/BLUE SHIELD | Admitting: Family Medicine

## 2016-02-13 VITALS — BP 121/82 | HR 94 | Temp 97.4°F | Ht 69.5 in | Wt 244.0 lb

## 2016-02-13 DIAGNOSIS — E559 Vitamin D deficiency, unspecified: Secondary | ICD-10-CM | POA: Diagnosis not present

## 2016-02-13 DIAGNOSIS — I10 Essential (primary) hypertension: Secondary | ICD-10-CM

## 2016-02-13 DIAGNOSIS — E119 Type 2 diabetes mellitus without complications: Secondary | ICD-10-CM

## 2016-02-13 DIAGNOSIS — E349 Endocrine disorder, unspecified: Secondary | ICD-10-CM

## 2016-02-13 DIAGNOSIS — E669 Obesity, unspecified: Secondary | ICD-10-CM

## 2016-02-13 DIAGNOSIS — E785 Hyperlipidemia, unspecified: Secondary | ICD-10-CM

## 2016-02-13 DIAGNOSIS — N400 Enlarged prostate without lower urinary tract symptoms: Secondary | ICD-10-CM | POA: Diagnosis not present

## 2016-02-13 DIAGNOSIS — E291 Testicular hypofunction: Secondary | ICD-10-CM | POA: Diagnosis not present

## 2016-02-13 LAB — POCT UA - MICROSCOPIC ONLY
Bacteria, U Microscopic: NEGATIVE
CASTS, UR, LPF, POC: NEGATIVE
CRYSTALS, UR, HPF, POC: NEGATIVE
Epithelial cells, urine per micros: NEGATIVE
Mucus, UA: NEGATIVE
WBC, Ur, HPF, POC: NEGATIVE
Yeast, UA: NEGATIVE

## 2016-02-13 LAB — POCT URINALYSIS DIPSTICK
Bilirubin, UA: NEGATIVE
Ketones, UA: NEGATIVE
Leukocytes, UA: NEGATIVE
NITRITE UA: NEGATIVE
PH UA: 5
PROTEIN UA: NEGATIVE
RBC UA: NEGATIVE
SPEC GRAV UA: 1.015
UROBILINOGEN UA: NEGATIVE

## 2016-02-13 MED ORDER — VITAMIN D (ERGOCALCIFEROL) 1.25 MG (50000 UNIT) PO CAPS: ORAL_CAPSULE | ORAL | Status: DC

## 2016-02-13 MED ORDER — TESTOSTERONE 20.25 MG/1.25GM (1.62%) TD GEL: 2.0000 "application " | Freq: Every day | TRANSDERMAL | Status: DC

## 2016-02-13 NOTE — Patient Instructions (Addendum)
Continue current medications. Continue good therapeutic lifestyle changes which include good diet and exercise. Fall precautions discussed with patient. If an FOBT was given today- please return it to our front desk. If you are over 64 years old - you may need Prevnar 31 or the adult Pneumonia vaccine.  **Flu shots are available--- please call and schedule a FLU-CLINIC appointment**  After your visit with [place] today you will receive a survey in the mail or online from [employer] regarding your care with [place]. Please take a moment to fill this out. Your feedback is very important to [place] as you can help [place] better understand your patient needs as well as improve your experience and satisfaction. WE CARE ABOUT YOU!!!   Continue to use warm wet compresses to neck and avoid heavy lifting pushing and pulling Take ibuprofen sparingly and on an as-needed basis after eating remembering that he can irritate the stomach raise the blood pressure and cause fluid retention Continue to follow diet closely and continue to work on losing weight as this is critically important for your diabetes heart disease and stroke avoidance. Since you do not think the AndroGel is helping that much, leave it off completely and have your testosterone level and blood work checked in 4 weeks. If you leave the testosterone off for this length of time and you still feel okay then it is your decision whether you want to restart it again or not. We will call you with your hemoglobin A1c results as soon as that is reported. Continue current blood sugar medication and take it regularly

## 2016-02-13 NOTE — Progress Notes (Signed)
Subjective:    Patient ID: Richard Nelson., male    DOB: 03/17/1951, 65 y.o.   MRN: [id number]  HPI Pt here for follow up and management of chronic medical problems which includes diabetes and hyperlipidemia. He is taking medications regularly. The patient had a hemoglobin A1c wheezing that was 7.7 and he was reminded that this is too high. His cholesterol numbers on the last blood work were excellent. He will continue with current treatment pending results the lab work we did a day. Had a long discussion about his neck pain his weight controlling his blood sugar regularly keeping his low cholesterol under control. He denies chest pain or shortness of breath. He is swallowing his food okay without nausea vomiting diarrhea blood in the stool or black tarry bowel movements. He does not have any abdominal pain. He is passing his water without problems. He is very frank and saying that he does not think the AndroGel helps that much and he does not use it regularly. After a long discussion about this we decided to not use any for one month and have the testosterone level repeated along with the rest of his blood work at that time and if he feels like he is in need of AndroGel to restart it we will restart it at that time.       Patient Active Problem List   Diagnosis Date Noted  . Anxiety states   . Depressive disorder, not elsewhere classified   . Hemorrhage of rectum and anus   . Polyp of colon   . Hyperplasia of prostate   . DM (diabetes mellitus) ([place])   . 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 Obesity 01/09/2011  . PRECORDIAL PAIN 01/09/2011  . HYPERLIPIDEMIA 01/07/2011  . TMJ PAIN 01/07/2011   Outpatient Encounter Prescriptions as of 02/13/2016  Medication Sig  . aspirin 81 MG EC tablet Take 81 mg by mouth daily.    . Dapagliflozin-Metformin HCl ER (XIGDUO XR) 04-999 MG TB24 Take 2 tablets by mouth daily. With food ( or you can also take 1 tablet bid)  . enalapril  (VASOTEC) 5 MG tablet Take 1 tablet (5 mg total) by mouth daily.  Marland Kitchen glucose blood test strip Use to check BG twice a day.  Dx:  250.02  . omega-3 acid ethyl esters (LOVAZA) 1 G capsule Take 2 capsules (2 g total) by mouth 2 (two) times daily.  Glory Rosebush DELICA LANCETS 45G MISC Test BS bid. Dx E11.9  . rosuvastatin (CRESTOR) 10 MG tablet Take 1 tablet (10 mg total) by mouth daily.  . Testosterone (ANDROGEL) 20.25 MG/1.25GM (1.62%) GEL Place 2 application onto the skin daily.  . Vitamin D, Ergocalciferol, (DRISDOL) 50000 units CAPS capsule TAKE 1 CAPSULE (50,000 UNITS TOTAL) BY MOUTH EVERY 7 (SEVEN) DAYS.  . [DISCONTINUED] Testosterone (ANDROGEL) 20.25 MG/1.25GM (1.62%) GEL Place 2 application onto the skin daily.  . [DISCONTINUED] Vitamin D, Ergocalciferol, (DRISDOL) 50000 UNITS CAPS capsule TAKE 1 CAPSULE (50,000 UNITS TOTAL) BY MOUTH EVERY 7 (SEVEN) DAYS.   No facility-administered encounter medications on file as of 02/13/2016.     Review of Systems  Constitutional: Negative.   HENT: Negative.   Eyes: Negative.   Respiratory: Negative.   Cardiovascular: Negative.   Gastrointestinal: Negative.   Endocrine: Negative.   Genitourinary: Negative.   Musculoskeletal: Positive for neck pain (left side).  Skin: Negative.   Allergic/Immunologic: Negative.   Neurological: Negative.   Hematological: Negative.   Psychiatric/Behavioral: Negative.  Objective:   Physical Exam  Constitutional: He is oriented to person, place, and time. He appears well-developed and well-nourished. No distress.  HENT:  Head: Normocephalic and atraumatic.  Right Ear: External ear normal.  Left Ear: External ear normal.  Nose: Nose normal.  Mouth/Throat: Oropharynx is clear and moist. No oropharyngeal exudate.  Eyes: Conjunctivae and EOM are normal. Pupils are equal, round, and reactive to light. Right eye exhibits no discharge. Left eye exhibits no discharge. No scleral icterus.  Neck: Normal range of  motion. Neck supple. No thyromegaly present.  No bruits or thyromegaly  Cardiovascular: Normal rate, regular rhythm, normal heart sounds and intact distal pulses.   No murmur heard. The heart is regular at 72/m  Pulmonary/Chest: Effort normal and breath sounds normal. No respiratory distress. He has no wheezes. He has no rales. He exhibits no tenderness.  Clear anteriorly and posteriorly  Abdominal: Soft. Bowel sounds are normal. He exhibits no mass. There is no tenderness. There is no rebound and no guarding.  Abdominal obesity without spleen or liver enlargement no bruits no inguinal adenopathy and no masses  Genitourinary: Rectum normal and penis normal.  The prostate was slightly enlarged today without lumps or masses. There are no rectal masses. There are no inguinal hernias. External genitalia were within normal limits.  Musculoskeletal: Normal range of motion. He exhibits no edema.  Lymphadenopathy:    He has no cervical adenopathy.  Neurological: He is alert and oriented to person, place, and time. He has normal reflexes. No cranial nerve deficit.  Skin: Skin is warm and dry. No rash noted.  Psychiatric: He has a normal mood and affect. His behavior is normal. Judgment and thought content normal.  Nursing note and vitals reviewed.  BP 121/82 mmHg  Pulse 94  Temp(Src) 97.4 F (36.3 C) (Oral)  Ht 5' 9.5" (1.765 m)  Wt 244 lb (110.678 kg)  BMI 35.53 kg/m2  [facility] reading (PRIMARY) by  Dr. Brunilda Payor x-ray-results pending                                        Assessment & Plan:  1. Type 2 diabetes mellitus without complication, without long-term current use of insulin ([place]) -Patient should continue with his current medication 150/1000 twice daily -He should make every effort at losing more weight - POCT glycosylated hemoglobin (Hb A1C); Future - Microalbumin / creatinine urine ratio - POCT glycosylated hemoglobin (Hb A1C) - BMP8+EGFR; Future - CBC with  Differential/Platelet; Future - DG Chest 2 View; Future  2. Essential hypertension, benign -The blood pressure is good today and he should continue with current treatment - BMP8+EGFR; Future - CBC with Differential/Platelet; Future - Hepatic function panel; Future - DG Chest 2 View; Future  3. Hyperlipidemia -Cholesterol numbers on the most recent check were good and he should continue with his Crestor as doing - CBC with Differential/Platelet; Future - Hepatic function panel; Future - NMR, lipoprofile; Future - DG Chest 2 View; Future  4. Testosterone deficiency -For now he is decided to hold the AndroGel and recheck a testosterone level in 4 weeks and see how he is feeling at that time. If he is feeling well and not having any less energy he will make a decision then whether to continue with the AndroGel or to continue not to use it for a longer period of time - POCT UA - Microscopic Only - POCT  urinalysis dipstick - CBC with Differential/Platelet; Future - Testosterone,Free and Total; Future - PSA, total and free; Future  5. BPH (benign prostatic hyperplasia) -The prostate remains slightly enlarged without any lumps or masses. The patient is having no symptoms with passing his water - POCT UA - Microscopic Only - POCT urinalysis dipstick - CBC with Differential/Platelet; Future - Testosterone,Free and Total; Future - PSA, total and free; Future  6. Vitamin D deficiency -Continue vitamin D replacement pending results of lab work- - CBC with Differential/Platelet; Future - VITAMIN D 25 Hydroxy (Vit-D Deficiency, Fractures); Future  7. Obesity -Continue to work aggressively with diet and exercise and weight loss  Meds ordered this encounter  Medications  . Testosterone (ANDROGEL) 20.25 MG/1.25GM (1.62%) GEL    Sig: Place 2 application onto the skin daily.    Dispense:  1.25 g    Refill:  3  . Vitamin D, Ergocalciferol, (DRISDOL) 50000 units CAPS capsule    Sig: TAKE 1  CAPSULE (50,000 UNITS TOTAL) BY MOUTH EVERY 7 (SEVEN) DAYS.    Dispense:  12 capsule    Refill:  1   The patient will hold the testosterone prescription until he gets his next testosterone level and make a decision at that time as to whether to resume taking it or not  Patient Instructions  Continue current medications. Continue good therapeutic lifestyle changes which include good diet and exercise. Fall precautions discussed with patient. If an FOBT was given today- please return it to our front desk. If you are over 71 years old - you may need Prevnar 64 or the adult Pneumonia vaccine.  **Flu shots are available--- please call and schedule a FLU-CLINIC appointment**  After your visit with [place] today you will receive a survey in the mail or online from [employer] regarding your care with [place]. Please take a moment to fill this out. Your feedback is very important to [place] as you can help [place] better understand your patient needs as well as improve your experience and satisfaction. WE CARE ABOUT YOU!!!   Continue to use warm wet compresses to neck and avoid heavy lifting pushing and pulling Take ibuprofen sparingly and on an as-needed basis after eating remembering that he can irritate the stomach raise the blood pressure and cause fluid retention Continue to follow diet closely and continue to work on losing weight as this is critically important for your diabetes heart disease and stroke avoidance. Since you do not think the AndroGel is helping that much, leave it off completely and have your testosterone level and blood work checked in 4 weeks. If you leave the testosterone off for this length of time and you still feel okay then it is your decision whether you want to restart it again or not. We will call you with your hemoglobin A1c results as soon as that is reported. Continue current blood sugar medication and take it regularly   Arrie Senate MD

## 2016-02-14 LAB — MICROALBUMIN / CREATININE URINE RATIO
CREATININE, UR: 42.3 mg/dL
MICROALB/CREAT RATIO: 12.3 mg/g{creat} (ref 0.0–30.0)
MICROALBUM., U, RANDOM: 5.2 ug/mL

## 2016-02-15 NOTE — Telephone Encounter (Signed)
Pt aware.

## 2016-03-23 ENCOUNTER — Other Ambulatory Visit: Payer: BLUE CROSS/BLUE SHIELD

## 2016-03-23 DIAGNOSIS — E349 Endocrine disorder, unspecified: Secondary | ICD-10-CM

## 2016-03-23 DIAGNOSIS — E559 Vitamin D deficiency, unspecified: Secondary | ICD-10-CM

## 2016-03-23 DIAGNOSIS — I10 Essential (primary) hypertension: Secondary | ICD-10-CM

## 2016-03-23 DIAGNOSIS — E119 Type 2 diabetes mellitus without complications: Secondary | ICD-10-CM

## 2016-03-23 DIAGNOSIS — E785 Hyperlipidemia, unspecified: Secondary | ICD-10-CM

## 2016-03-23 DIAGNOSIS — N400 Enlarged prostate without lower urinary tract symptoms: Secondary | ICD-10-CM

## 2016-03-25 LAB — BMP8+EGFR
BUN/Creatinine Ratio: 16 (ref 10–24)
BUN: 11 mg/dL (ref 8–27)
CALCIUM: 9.7 mg/dL (ref 8.6–10.2)
CHLORIDE: 98 mmol/L (ref 96–106)
CO2: 22 mmol/L (ref 18–29)
Creatinine, Ser: 0.69 mg/dL — ABNORMAL LOW (ref 0.76–1.27)
GFR calc Af Amer: 117 mL/min/{1.73_m2} (ref >59)
GFR, EST NON AFRICAN AMERICAN: 101 mL/min/{1.73_m2} (ref >59)
Glucose: 190 mg/dL — ABNORMAL HIGH (ref 65–99)
POTASSIUM: 4.4 mmol/L (ref 3.5–5.2)
SODIUM: 139 mmol/L (ref 134–144)

## 2016-03-25 LAB — NMR, LIPOPROFILE
Cholesterol: 126 mg/dL (ref 100–199)
HDL CHOLESTEROL BY NMR: 40 mg/dL (ref >39)
HDL PARTICLE NUMBER: 29.9 umol/L — AB (ref >=30.5)
LDL Particle Number: 1054 nmol/L — ABNORMAL HIGH (ref <1000)
LDL Size: 20.1 nm (ref >20.5)
LDL-C: 67 mg/dL (ref 0–99)
LP-IR Score: 60 — ABNORMAL HIGH (ref <=45)
SMALL LDL PARTICLE NUMBER: 672 nmol/L — AB (ref <=527)
TRIGLYCERIDES BY NMR: 97 mg/dL (ref 0–149)

## 2016-03-25 LAB — CBC WITH DIFFERENTIAL/PLATELET
BASOS: 0 %
Basophils Absolute: 0 10*3/uL (ref 0.0–0.2)
EOS (ABSOLUTE): 0.2 10*3/uL (ref 0.0–0.4)
Eos: 3 %
Hematocrit: 47.2 % (ref 37.5–51.0)
Hemoglobin: 16.1 g/dL (ref 12.6–17.7)
Immature Grans (Abs): 0 10*3/uL (ref 0.0–0.1)
Immature Granulocytes: 0 %
Lymphocytes Absolute: 2.2 10*3/uL (ref 0.7–3.1)
Lymphs: 33 %
MCH: 26.7 pg (ref 26.6–33.0)
MCHC: 34.1 g/dL (ref 31.5–35.7)
MCV: 78 fL — ABNORMAL LOW (ref 79–97)
Monocytes Absolute: 0.4 10*3/uL (ref 0.1–0.9)
Monocytes: 7 %
Neutrophils Absolute: 4 10*3/uL (ref 1.4–7.0)
Neutrophils: 57 %
PLATELETS: 219 10*3/uL (ref 150–379)
RBC: 6.04 x10E6/uL — AB (ref 4.14–5.80)
RDW: 13.3 % (ref 12.3–15.4)
WBC: 6.8 10*3/uL (ref 3.4–10.8)

## 2016-03-25 LAB — PSA, TOTAL AND FREE
PROSTATE SPECIFIC AG, SERUM: 0.6 ng/mL (ref 0.0–4.0)
PSA FREE: 0.15 ng/mL
PSA, Free Pct: 25 %

## 2016-03-25 LAB — TESTOSTERONE,FREE AND TOTAL
TESTOSTERONE FREE: 6.1 pg/mL — AB (ref 6.6–18.1)
TESTOSTERONE: 259 ng/dL — AB (ref 348–1197)

## 2016-03-25 LAB — HEPATIC FUNCTION PANEL
ALBUMIN: 4.5 g/dL (ref 3.6–4.8)
ALT: 23 IU/L (ref 0–44)
AST: 18 IU/L (ref 0–40)
Alkaline Phosphatase: 86 IU/L (ref 39–117)
BILIRUBIN TOTAL: 0.6 mg/dL (ref 0.0–1.2)
Bilirubin, Direct: 0.19 mg/dL (ref 0.00–0.40)
Total Protein: 6.8 g/dL (ref 6.0–8.5)

## 2016-03-25 LAB — VITAMIN D 25 HYDROXY (VIT D DEFICIENCY, FRACTURES): VIT D 25 HYDROXY: 36.4 ng/mL (ref 30.0–100.0)

## 2016-05-11 ENCOUNTER — Other Ambulatory Visit: Payer: Self-pay | Admitting: Family Medicine

## 2016-05-29 ENCOUNTER — Other Ambulatory Visit: Payer: Self-pay | Admitting: Family Medicine

## 2016-06-05 ENCOUNTER — Other Ambulatory Visit: Payer: Self-pay | Admitting: Family Medicine

## 2016-06-17 ENCOUNTER — Ambulatory Visit (INDEPENDENT_AMBULATORY_CARE_PROVIDER_SITE_OTHER): Payer: BLUE CROSS/BLUE SHIELD | Admitting: Family Medicine

## 2016-06-17 ENCOUNTER — Encounter: Payer: Self-pay | Admitting: Family Medicine

## 2016-06-17 VITALS — BP 139/83 | HR 91 | Temp 98.4°F | Ht 69.5 in | Wt 242.6 lb

## 2016-06-17 DIAGNOSIS — E291 Testicular hypofunction: Secondary | ICD-10-CM | POA: Diagnosis not present

## 2016-06-17 DIAGNOSIS — M25562 Pain in left knee: Secondary | ICD-10-CM | POA: Diagnosis not present

## 2016-06-17 DIAGNOSIS — E349 Endocrine disorder, unspecified: Secondary | ICD-10-CM

## 2016-06-17 DIAGNOSIS — S80912A Unspecified superficial injury of left knee, initial encounter: Secondary | ICD-10-CM

## 2016-06-17 DIAGNOSIS — W57XXXA Bitten or stung by nonvenomous insect and other nonvenomous arthropods, initial encounter: Secondary | ICD-10-CM

## 2016-06-17 DIAGNOSIS — E119 Type 2 diabetes mellitus without complications: Secondary | ICD-10-CM

## 2016-06-17 MED ORDER — VITAMIN D (ERGOCALCIFEROL) 1.25 MG (50000 UNIT) PO CAPS: ORAL_CAPSULE | ORAL | Status: DC

## 2016-06-17 MED ORDER — DOXYCYCLINE HYCLATE 100 MG PO TABS: 100.0000 mg | ORAL_TABLET | Freq: Two times a day (BID) | ORAL | Status: DC

## 2016-06-17 MED ORDER — ROSUVASTATIN CALCIUM 10 MG PO TABS: 10.0000 mg | ORAL_TABLET | Freq: Every day | ORAL | Status: DC

## 2016-06-17 MED ORDER — ENALAPRIL MALEATE 5 MG PO TABS: 5.0000 mg | ORAL_TABLET | Freq: Every day | ORAL | Status: DC

## 2016-06-17 MED ORDER — OMEGA-3-ACID ETHYL ESTERS 1 G PO CAPS: ORAL_CAPSULE | ORAL | Status: DC

## 2016-06-17 NOTE — Patient Instructions (Signed)
Continue to watch diet closely take current medications Exercise as much as possible and keep the weight down as much as possible Take the antibiotic twice daily with food until completed for 3 weeks If the knee pain continues or gets worse, the patient should call back and we will arrange for him to see the orthopedic specialist that comes here from [facility] In the meantime, he can take some ibuprofen 1 twice daily after breakfast and supper if the knee pain worsens for a short period of time.

## 2016-06-17 NOTE — Progress Notes (Signed)
Subjective:    Patient ID: Richard Nelson., male    DOB: 08/26/1951, 65 y.o.   MRN: [id number]  HPI Patient is here today for a 4 month follow up on hypertension, hyperlipidemia, and diabetes. Patient also states that he has had 4-5 tick bites, flea bites and left knee swelling and pain. The last tick bite was about 3 weeks ago. The other ones were before that. Some of these tick bites were regular dog ticks and wood ticks and some of them were deer ticks. The patient denies any chest pain chest tightness or shortness of breath. He has no trouble with his GI tract with swallowing heartburn indigestion nausea vomiting diarrhea or blood in the stool. He's passing his water without problems. He left off the AndroGel and he cannot tell any difference is the way he is feeling after leaving it off and he does not want to restart this.   Review of Systems  Constitutional: Negative.   HENT: Negative.   Eyes: Negative.   Respiratory: Negative.   Cardiovascular: Negative.   Gastrointestinal: Negative.   Endocrine: Negative.   Genitourinary: Negative.   Musculoskeletal:       Left knee pain and swelling   Skin: Negative.   Allergic/Immunologic: Negative.   Neurological: Negative.   Hematological: Negative.   Psychiatric/Behavioral: Negative.    Patient Active Problem List   Diagnosis Date Noted  . Anxiety states   . Depressive disorder, not elsewhere classified   . Hemorrhage of rectum and anus   . Polyp of colon   . Hyperplasia of prostate   . DM (diabetes mellitus) ([place])   . 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 Obesity 01/09/2011  . PRECORDIAL PAIN 01/09/2011  . HYPERLIPIDEMIA 01/07/2011  . TMJ PAIN 01/07/2011   Outpatient Encounter Prescriptions as of 06/17/2016  Medication Sig  . aspirin 81 MG EC tablet Take 81 mg by mouth daily.    . Dapagliflozin-Metformin HCl ER (XIGDUO XR) 04-999 MG TB24 Take 2 tablets by mouth daily. With food ( or you can also take 1 tablet  bid)  . enalapril (VASOTEC) 5 MG tablet Take 1 tablet (5 mg total) by mouth daily.  Marland Kitchen omega-3 acid ethyl esters (LOVAZA) 1 g capsule TAKE 2 CAPSULES (2 G TOTAL) BY MOUTH 2 (TWO) TIMES DAILY.  . ONE TOUCH ULTRA TEST test strip CHECK BG TWICE DAILY  . ONETOUCH DELICA LANCETS 99991111 MISC Test BS bid. Dx E11.9  . rosuvastatin (CRESTOR) 10 MG tablet TAKE 1 TABLET EVERY DAY  . Vitamin D, Ergocalciferol, (DRISDOL) 50000 units CAPS capsule TAKE 1 CAPSULE (50,000 UNITS TOTAL) BY MOUTH EVERY 7 (SEVEN) DAYS.  . [DISCONTINUED] Testosterone (ANDROGEL) 20.25 MG/1.25GM (1.62%) GEL Place 2 application onto the skin daily. (Patient not taking: Reported on 06/17/2016)   No facility-administered encounter medications on file as of 06/17/2016.       Objective:   Physical Exam  Constitutional: He is oriented to person, place, and time. He appears well-developed and well-nourished. No distress.  HENT:  Head: Normocephalic and atraumatic.  Right Ear: External ear normal.  Left Ear: External ear normal.  Nose: Nose normal.  Mouth/Throat: Oropharynx is clear and moist. No oropharyngeal exudate.  Eyes: Conjunctivae and EOM are normal. Pupils are equal, round, and reactive to light. Right eye exhibits no discharge. Left eye exhibits no discharge. No scleral icterus.  Neck: Normal range of motion. Neck supple. No thyromegaly present.  No bruits thyromegaly or anterior cervical adenopathy  Cardiovascular: Normal rate, regular rhythm, normal heart sounds and intact distal pulses.   No murmur heard. Pulmonary/Chest: Effort normal and breath sounds normal. No respiratory distress. He has no wheezes. He has no rales. He exhibits no tenderness.  Clear anteriorly and posteriorly no axillary adenopathy  Abdominal: Soft. Bowel sounds are normal. He exhibits no mass. There is no tenderness. There is no rebound and no guarding.  Obese without masses or tenderness  Musculoskeletal: Normal range of motion. He exhibits no edema or  tenderness.  The patella on the left is slightly irregular with a slight bony prominence in the upper lateral margin of the patella without tenderness. There is no specific joint line tenderness. There is fairly good flexibility of the knee.  Lymphadenopathy:    He has no cervical adenopathy.  Neurological: He is alert and oriented to person, place, and time. He has normal reflexes. No cranial nerve deficit.  Skin: Skin is warm and dry. No rash noted.  Several tick bite sites were noted on the mid back between the scapula a period and in the groin area especially on the left side of the upper thigh.  Psychiatric: He has a normal mood and affect. His behavior is normal. Judgment and thought content normal.  Nursing note and vitals reviewed.  BP 139/83 mmHg  Pulse 91  Temp(Src) 98.4 F (36.9 C) (Oral)  Ht 5' 9.5" (1.765 m)  Wt 242 lb 9.6 oz (110.043 kg)  BMI 35.32 kg/m2  [facility] reading (PRIMARY) by  Dr. Louretta Parma knee with results pending                                        Assessment & Plan:  1. Tick bite -The patient has had multiple tick bites. He hadn't noticed significant rash from these bites other than pruritus and local redness. The last tick bite was over 3 weeks ago. - Lyme Ab/Western Blot Reflex - Rocky mtn spotted fvr abs pnl(IgG+IgM)  2. Left knee pain -The patient has had arthroscopic surgery on the left knee in the past. Over the past several months he has fallen at least 3 times and injured his knee. He's had increased swelling with the knee and more discomfort. -We will get x-rays and the patient continues to have increasing pain and discomfort we will have him see the orthopedic specialist who may need to get an MRI to further evaluat -He can take ibuprofen one twice daily after breakfast and supper for several days to see if this helps relieve the discomfort in the knee. - DG Knee 1-2 Views Left; Future  3. Type 2 diabetes mellitus without complication, without  long-term current use of insulin (HCC) -Continue to take current medicines for blood sugar and stay active as possible and watch diet as closely as possible   4. Testosterone deficiency -He is no longer taking the testosterone secondary to the fact that it did not help him  Meds ordered this encounter  Medications  . omega-3 acid ethyl esters (LOVAZA) 1 g capsule    Sig: TAKE 2 CAPSULES (2 G TOTAL) BY MOUTH 2 (TWO) TIMES DAILY.    Dispense:  360 capsule    Refill:  2  . rosuvastatin (CRESTOR) 10 MG tablet    Sig: Take 1 tablet (10 mg total) by mouth daily.    Dispense:  90 tablet    Refill:  3  .  enalapril (VASOTEC) 5 MG tablet    Sig: Take 1 tablet (5 mg total) by mouth daily.    Dispense:  90 tablet    Refill:  3  . Vitamin D, Ergocalciferol, (DRISDOL) 50000 units CAPS capsule    Sig: TAKE 1 CAPSULE (50,000 UNITS TOTAL) BY MOUTH EVERY 7 (SEVEN) DAYS.    Dispense:  12 capsule    Refill:  1  . doxycycline (VIBRA-TABS) 100 MG tablet    Sig: Take 1 tablet (100 mg total) by mouth 2 (two) times daily.    Dispense:  42 tablet    Refill:  0     Patient Instructions  Continue to watch diet closely take current medications Exercise as much as possible and keep the weight down as much as possible Take the antibiotic twice daily with food until completed for 3 weeks If the knee pain continues or gets worse, the patient should call back and we will arrange for him to see the orthopedic specialist that comes here from [facility] In the meantime, he can take some ibuprofen 1 twice daily after breakfast and supper if the knee pain worsens for a short period of time.   Arrie Senate MD

## 2016-06-19 ENCOUNTER — Ambulatory Visit (INDEPENDENT_AMBULATORY_CARE_PROVIDER_SITE_OTHER): Payer: BLUE CROSS/BLUE SHIELD

## 2016-06-19 DIAGNOSIS — M25562 Pain in left knee: Secondary | ICD-10-CM | POA: Diagnosis not present

## 2016-06-19 LAB — LYME AB/WESTERN BLOT REFLEX
LYME DISEASE AB, QUANT, IGM: <0.8 index (ref 0.00–0.79)
Lyme IgG/IgM Ab: <0.91 {ISR} (ref 0.00–0.90)

## 2016-06-19 LAB — ROCKY MTN SPOTTED FVR ABS PNL(IGG+IGM)
RMSF IGG: NEGATIVE
RMSF IGM: 0.29 {index} (ref 0.00–0.89)

## 2016-06-25 ENCOUNTER — Telehealth: Payer: Self-pay

## 2016-06-25 NOTE — Telephone Encounter (Signed)
Insurance denied prior authorization of Omega 3   DWM

## 2016-07-03 ENCOUNTER — Other Ambulatory Visit: Payer: Self-pay | Admitting: *Deleted

## 2016-07-03 DIAGNOSIS — M25562 Pain in left knee: Secondary | ICD-10-CM

## 2016-07-11 ENCOUNTER — Other Ambulatory Visit: Payer: Self-pay | Admitting: Family Medicine

## 2016-07-11 DIAGNOSIS — H02836 Dermatochalasis of left eye, unspecified eyelid: Secondary | ICD-10-CM | POA: Insufficient documentation

## 2016-07-11 DIAGNOSIS — H02833 Dermatochalasis of right eye, unspecified eyelid: Secondary | ICD-10-CM | POA: Insufficient documentation

## 2016-07-11 DIAGNOSIS — H43393 Other vitreous opacities, bilateral: Secondary | ICD-10-CM | POA: Insufficient documentation

## 2016-07-11 DIAGNOSIS — H11823 Conjunctivochalasis, bilateral: Secondary | ICD-10-CM | POA: Insufficient documentation

## 2016-07-17 ENCOUNTER — Other Ambulatory Visit: Payer: Self-pay | Admitting: Family Medicine

## 2016-07-24 ENCOUNTER — Other Ambulatory Visit: Payer: Self-pay

## 2016-07-24 MED ORDER — GLUCOSE BLOOD VI STRP: ORAL_STRIP | 3 refills | Status: DC

## 2016-08-03 ENCOUNTER — Other Ambulatory Visit: Payer: Self-pay | Admitting: Family Medicine

## 2016-09-28 ENCOUNTER — Other Ambulatory Visit: Payer: Self-pay | Admitting: Family Medicine

## 2016-10-23 ENCOUNTER — Ambulatory Visit: Payer: BLUE CROSS/BLUE SHIELD | Admitting: Family Medicine

## 2016-11-23 ENCOUNTER — Other Ambulatory Visit: Payer: Self-pay | Admitting: Family Medicine

## 2016-11-29 ENCOUNTER — Ambulatory Visit (INDEPENDENT_AMBULATORY_CARE_PROVIDER_SITE_OTHER): Payer: BLUE CROSS/BLUE SHIELD | Admitting: Family Medicine

## 2016-11-29 ENCOUNTER — Encounter: Payer: Self-pay | Admitting: Family Medicine

## 2016-11-29 VITALS — BP 124/83 | HR 94 | Temp 97.4°F | Ht 69.5 in | Wt 236.0 lb

## 2016-11-29 DIAGNOSIS — E7800 Pure hypercholesterolemia, unspecified: Secondary | ICD-10-CM

## 2016-11-29 DIAGNOSIS — I10 Essential (primary) hypertension: Secondary | ICD-10-CM

## 2016-11-29 DIAGNOSIS — E559 Vitamin D deficiency, unspecified: Secondary | ICD-10-CM

## 2016-11-29 DIAGNOSIS — E119 Type 2 diabetes mellitus without complications: Secondary | ICD-10-CM

## 2016-11-29 DIAGNOSIS — E349 Endocrine disorder, unspecified: Secondary | ICD-10-CM | POA: Diagnosis not present

## 2016-11-29 DIAGNOSIS — N400 Enlarged prostate without lower urinary tract symptoms: Secondary | ICD-10-CM

## 2016-11-29 NOTE — Progress Notes (Signed)
Subjective:    Patient ID: Richard Nelson., male    DOB: 1951/12/03, 65 y.o.   MRN: [id number]  HPI Pt here for follow up and management of chronic medical problems which includes diabetes and hyperlipidemia. He is taking medication regularly.The patient is doing well overall. He is has some recent left low back pain which is now better. He has had his eye exam and this was done by a physician at [facility] Dr. Rachelle Hora. He will get lab work today and will be given an FOBT to return. His vital signs are stable. His weight is down 7 pounds since the last visit but his BMI is still 35.4. Because of diabetes and hyperlipidemia he would be considered morbid obesity because of 2 or more comorbidities conditions. The patient denies any chest pain or shortness of breath. He does have some occasional indigestion secondary to caffeine intake. He's not having any problems with passing his water including burning pain or frequency. His intestinal track other than the indigestion is stable and he denies any blood in the stool or black tarry bowel movements or change in bowel habits. He will be due another colonoscopy sometime in 2019. The patient is still dealing with the stress of losing his brother he says his parents are to not as expected.     Patient Active Problem List   Diagnosis Date Noted  . Anxiety states   . Depressive disorder, not elsewhere classified   . Hemorrhage of rectum and anus   . Polyp of colon   . Hyperplasia of prostate   . DM (diabetes mellitus) ([place])   . 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 Obesity 01/09/2011  . PRECORDIAL PAIN 01/09/2011  . HYPERLIPIDEMIA 01/07/2011  . TMJ PAIN 01/07/2011   Outpatient Encounter Prescriptions as of 11/29/2016  Medication Sig  . aspirin 81 MG EC tablet Take 81 mg by mouth daily.    . enalapril (VASOTEC) 5 MG tablet Take 1 tablet (5 mg total) by mouth daily.  Marland Kitchen glucose blood (ONE TOUCH ULTRA TEST) test strip CHECK BG TWICE DAILY   . omega-3 acid ethyl esters (LOVAZA) 1 g capsule TAKE 2 CAPSULES (2 G TOTAL) BY MOUTH 2 (TWO) TIMES DAILY.  Marland Kitchen ONETOUCH DELICA LANCETS 43X MISC Test BS bid. Dx E11.9  . rosuvastatin (CRESTOR) 10 MG tablet Take 1 tablet (10 mg total) by mouth daily.  . Vitamin D, Ergocalciferol, (DRISDOL) 50000 units CAPS capsule TAKE 1 CAPSULE (50,000 UNITS TOTAL) BY MOUTH EVERY 7 (SEVEN) DAYS.  Marland Kitchen XIGDUO XR 04-999 MG TB24 TAKE 2 TABLETS BY MOUTH DAILY. WITH FOOD  . [DISCONTINUED] doxycycline (VIBRA-TABS) 100 MG tablet Take 1 tablet (100 mg total) by mouth 2 (two) times daily.  . [DISCONTINUED] enalapril (VASOTEC) 5 MG tablet TAKE 1 TABLET (5 MG TOTAL) BY MOUTH DAILY.   No facility-administered encounter medications on file as of 11/29/2016.      Review of Systems  Constitutional: Negative.   HENT: Negative.   Eyes: Negative.   Respiratory: Negative.   Cardiovascular: Negative.   Gastrointestinal: Negative.   Endocrine: Negative.   Genitourinary: Negative.   Musculoskeletal: Positive for back pain (recent lower left back pain).  Skin: Negative.   Allergic/Immunologic: Negative.   Neurological: Negative.   Hematological: Negative.   Psychiatric/Behavioral: Negative.        Objective:   Physical Exam  Constitutional: He is oriented to person, place, and time. He appears well-developed and well-nourished. No distress.  HENT:  Head:  Normocephalic and atraumatic.  Right Ear: External ear normal.  Left Ear: External ear normal.  Mouth/Throat: Oropharynx is clear and moist. No oropharyngeal exudate.  Nasal congestion bilaterally  Eyes: Conjunctivae and EOM are normal. Pupils are equal, round, and reactive to light. Right eye exhibits no discharge. Left eye exhibits no discharge. No scleral icterus.  Neck: Normal range of motion. Neck supple. No thyromegaly present.  Always asked that ophthalmologist to send [place] a copy of the eye exam report  Cardiovascular: Normal rate, regular rhythm, normal heart  sounds and intact distal pulses.   No murmur heard. Heart has a regular rate and rhythm at 72/m  Pulmonary/Chest: Effort normal and breath sounds normal. No respiratory distress. He has no wheezes. He has no rales. He exhibits no tenderness.  Clear anteriorly and posteriorly no axillary adenopathy  Abdominal: Soft. Bowel sounds are normal. He exhibits no mass. There is tenderness. There is no rebound and no guarding.  The abdomen is obese without liver or spleen enlargement or masses. There is no bruits. It was slight tenderness in the left lower quadrant. He was no inguinal adenopathy.  Musculoskeletal: Normal range of motion. He exhibits no edema.  Lymphadenopathy:    He has no cervical adenopathy.  Neurological: He is alert and oriented to person, place, and time. He has normal reflexes. No cranial nerve deficit.  Skin: Skin is warm and dry. No rash noted.  Psychiatric: He has a normal mood and affect. His behavior is normal. Judgment and thought content normal.  Nursing note and vitals reviewed.  BP 124/83 (BP Location: Left Arm)   Pulse 94   Temp 97.4 F (36.3 C) (Oral)   Ht 5' 9.5" (1.765 m)   Wt 236 lb (107 kg)   BMI 34.35 kg/m         Assessment & Plan:  1. Type 2 diabetes mellitus without complication, without long-term current use of insulin (HCC) -Patient's blood sugars at home have been running during the day 150-170 and 120-140 fasting. -He should continue with aggressive therapeutic lifestyle changes - BMP8+EGFR; Future - CBC with Differential/Platelet; Future - Bayer DCA Hb A1c Waived; Future  2. Testosterone deficiency - CBC with Differential/Platelet; Future  3. Essential hypertension, benign -The blood pressure is good today and he will continue with current treatment - BMP8+EGFR; Future - CBC with Differential/Platelet; Future - Hepatic function panel; Future  4. Pure hypercholesterolemia -Continue with aggressive therapeutic lifestyle changes and  current treatment pending results of lab work - CBC with Differential/Platelet; Future - NMR, lipoprofile; Future  5. Benign prostatic hyperplasia, unspecified whether lower urinary tract symptoms present -The patient has no complaints with this today and no symptoms with voiding - CBC with Differential/Platelet; Future  6. Vitamin D deficiency -Continue with current treatment pending results of lab work - CBC with Differential/Platelet; Future - VITAMIN D 25 Hydroxy (Vit-D Deficiency, Fractures); Future  7. Morbid obesity ([place]) -It was stressed to the patient that he must do better and continue to lose weight as aggressively as possible through diet and exercise  Patient Instructions                       Medicare Annual Wellness Visit  [facility] and the medical providers at [facility] strive to bring you the best medical care.  In doing so we not only want to address your current medical conditions and concerns but also to detect new conditions early and prevent illness, disease  and health-related problems.    Medicare offers a yearly Wellness Visit which allows our clinical staff to assess your need for preventative services including immunizations, lifestyle education, counseling to decrease risk of preventable diseases and screening for fall risk and other medical concerns.    This visit is provided free of charge (no copay) for all Medicare recipients. The clinical pharmacists at [facility] have begun to conduct these Wellness Visits which will also include a thorough review of all your medications.    As you primary medical provider recommend that you make an appointment for your Annual Wellness Visit if you have not done so already this year.  You may set up this appointment before you leave today or you may call back ([phone]) and schedule an appointment.  Please make sure when you call that you mention that you are scheduling your  Annual Wellness Visit with the clinical pharmacist so that the appointment may be made for the proper length of time.     Continue current medications. Continue good therapeutic lifestyle changes which include good diet and exercise. Fall precautions discussed with patient. If an FOBT was given today- please return it to our front desk. If you are over 6 years old - you may need Prevnar 1 or the adult Pneumonia vaccine.  **Flu shots are available--- please call and schedule a FLU-CLINIC appointment**  After your visit with [place] today you will receive a survey in the mail or online from [employer] regarding your care with [place]. Please take a moment to fill this out. Your feedback is very important to [place] as you can help [place] better understand your patient needs as well as improve your experience and satisfaction. WE CARE ABOUT YOU!!!   Continue to work on weight aggressively with diet and exercise At some point in the next year we probably need to get another stress test In 2019 you will need another colonoscopy Avoid the use of overhead fans and use cool mist humidification especially in the wintertime. Use nasal saline during the day Return the FOBT   Arrie Senate MD

## 2016-11-29 NOTE — Patient Instructions (Addendum)
Medicare Annual Wellness Visit  [facility] and the medical providers at [facility] strive to bring you the best medical care.  In doing so we not only want to address your current medical conditions and concerns but also to detect new conditions early and prevent illness, disease and health-related problems.    Medicare offers a yearly Wellness Visit which allows our clinical staff to assess your need for preventative services including immunizations, lifestyle education, counseling to decrease risk of preventable diseases and screening for fall risk and other medical concerns.    This visit is provided free of charge (no copay) for all Medicare recipients. The clinical pharmacists at [facility] have begun to conduct these Wellness Visits which will also include a thorough review of all your medications.    As you primary medical provider recommend that you make an appointment for your Annual Wellness Visit if you have not done so already this year.  You may set up this appointment before you leave today or you may call back [phone] and schedule an appointment.  Please make sure when you call that you mention that you are scheduling your Annual Wellness Visit with the clinical pharmacist so that the appointment may be made for the proper length of time.     Continue current medications. Continue good therapeutic lifestyle changes which include good diet and exercise. Fall precautions discussed with patient. If an FOBT was given today- please return it to our front desk. If you are over 74 years old - you may need Prevnar 93 or the adult Pneumonia vaccine.  **Flu shots are available--- please call and schedule a FLU-CLINIC appointment**  After your visit with [place] today you will receive a survey in the mail or online from [employer] regarding your care with [place]. Please take a moment to fill this out. Your feedback is very  important to [place] as you can help [place] better understand your patient needs as well as improve your experience and satisfaction. WE CARE ABOUT YOU!!!   Continue to work on weight aggressively with diet and exercise At some point in the next year we probably need to get another stress test In 2019 you will need another colonoscopy Avoid the use of overhead fans and use cool mist humidification especially in the wintertime. Use nasal saline during the day Return the FOBT

## 2016-12-06 ENCOUNTER — Other Ambulatory Visit: Payer: Self-pay | Admitting: Family Medicine

## 2016-12-12 ENCOUNTER — Other Ambulatory Visit: Payer: BLUE CROSS/BLUE SHIELD

## 2016-12-12 DIAGNOSIS — E349 Endocrine disorder, unspecified: Secondary | ICD-10-CM

## 2016-12-12 DIAGNOSIS — N400 Enlarged prostate without lower urinary tract symptoms: Secondary | ICD-10-CM

## 2016-12-12 DIAGNOSIS — E7800 Pure hypercholesterolemia, unspecified: Secondary | ICD-10-CM

## 2016-12-12 DIAGNOSIS — E559 Vitamin D deficiency, unspecified: Secondary | ICD-10-CM

## 2016-12-12 DIAGNOSIS — I10 Essential (primary) hypertension: Secondary | ICD-10-CM

## 2016-12-12 DIAGNOSIS — E119 Type 2 diabetes mellitus without complications: Secondary | ICD-10-CM

## 2016-12-12 LAB — BAYER DCA HB A1C WAIVED: HB A1C (BAYER DCA - WAIVED): 10.3 % — ABNORMAL HIGH (ref <7.0)

## 2016-12-13 LAB — BMP8+EGFR
BUN / CREAT RATIO: 20 (ref 10–24)
BUN: 15 mg/dL (ref 8–27)
CALCIUM: 9.4 mg/dL (ref 8.6–10.2)
CHLORIDE: 103 mmol/L (ref 96–106)
CO2: 19 mmol/L (ref 18–29)
Creatinine, Ser: 0.76 mg/dL (ref 0.76–1.27)
GFR, EST AFRICAN AMERICAN: 111 mL/min/{1.73_m2} (ref >59)
GFR, EST NON AFRICAN AMERICAN: 96 mL/min/{1.73_m2} (ref >59)
Glucose: 206 mg/dL — ABNORMAL HIGH (ref 65–99)
POTASSIUM: 4.2 mmol/L (ref 3.5–5.2)
Sodium: 143 mmol/L (ref 134–144)

## 2016-12-13 LAB — CBC WITH DIFFERENTIAL/PLATELET
BASOS ABS: 0 10*3/uL (ref 0.0–0.2)
BASOS: 0 %
EOS (ABSOLUTE): 0.2 10*3/uL (ref 0.0–0.4)
Eos: 3 %
Hematocrit: 45.9 % (ref 37.5–51.0)
Hemoglobin: 15.1 g/dL (ref 13.0–17.7)
Immature Grans (Abs): 0 10*3/uL (ref 0.0–0.1)
Immature Granulocytes: 0 %
LYMPHS ABS: 1.9 10*3/uL (ref 0.7–3.1)
LYMPHS: 28 %
MCH: 26.1 pg — AB (ref 26.6–33.0)
MCHC: 32.9 g/dL (ref 31.5–35.7)
MCV: 79 fL (ref 79–97)
MONOS ABS: 0.6 10*3/uL (ref 0.1–0.9)
Monocytes: 9 %
NEUTROS ABS: 4.1 10*3/uL (ref 1.4–7.0)
Neutrophils: 60 %
PLATELETS: 212 10*3/uL (ref 150–379)
RBC: 5.78 x10E6/uL (ref 4.14–5.80)
RDW: 13.4 % (ref 12.3–15.4)
WBC: 6.8 10*3/uL (ref 3.4–10.8)

## 2016-12-13 LAB — HEPATIC FUNCTION PANEL
ALT: 25 IU/L (ref 0–44)
AST: 15 IU/L (ref 0–40)
Albumin: 4.6 g/dL (ref 3.6–4.8)
Alkaline Phosphatase: 95 IU/L (ref 39–117)
BILIRUBIN, DIRECT: 0.13 mg/dL (ref 0.00–0.40)
Bilirubin Total: 0.4 mg/dL (ref 0.0–1.2)
TOTAL PROTEIN: 6.7 g/dL (ref 6.0–8.5)

## 2016-12-13 LAB — NMR, LIPOPROFILE
Cholesterol: 138 mg/dL (ref 100–199)
HDL CHOLESTEROL BY NMR: 41 mg/dL (ref >39)
HDL Particle Number: 33.4 umol/L (ref >=30.5)
LDL PARTICLE NUMBER: 1174 nmol/L — AB (ref <1000)
LDL SIZE: 19.6 nm (ref >20.5)
LDL-C: 67 mg/dL (ref 0–99)
LP-IR Score: 61 — ABNORMAL HIGH (ref <=45)
SMALL LDL PARTICLE NUMBER: 976 nmol/L — AB (ref <=527)
TRIGLYCERIDES BY NMR: 151 mg/dL — AB (ref 0–149)

## 2016-12-13 LAB — VITAMIN D 25 HYDROXY (VIT D DEFICIENCY, FRACTURES): VIT D 25 HYDROXY: 25.9 ng/mL — AB (ref 30.0–100.0)

## 2017-03-10 ENCOUNTER — Other Ambulatory Visit: Payer: Self-pay | Admitting: Family Medicine

## 2017-04-03 ENCOUNTER — Ambulatory Visit (INDEPENDENT_AMBULATORY_CARE_PROVIDER_SITE_OTHER): Payer: BLUE CROSS/BLUE SHIELD | Admitting: Family Medicine

## 2017-04-03 ENCOUNTER — Encounter: Payer: Self-pay | Admitting: Family Medicine

## 2017-04-03 ENCOUNTER — Ambulatory Visit (INDEPENDENT_AMBULATORY_CARE_PROVIDER_SITE_OTHER): Payer: BLUE CROSS/BLUE SHIELD

## 2017-04-03 VITALS — BP 125/87 | HR 92 | Temp 97.6°F | Ht 69.5 in | Wt 239.0 lb

## 2017-04-03 DIAGNOSIS — E119 Type 2 diabetes mellitus without complications: Secondary | ICD-10-CM

## 2017-04-03 DIAGNOSIS — Z0000 Encounter for general adult medical examination without abnormal findings: Secondary | ICD-10-CM | POA: Diagnosis not present

## 2017-04-03 DIAGNOSIS — N400 Enlarged prostate without lower urinary tract symptoms: Secondary | ICD-10-CM

## 2017-04-03 DIAGNOSIS — E7800 Pure hypercholesterolemia, unspecified: Secondary | ICD-10-CM

## 2017-04-03 DIAGNOSIS — M79672 Pain in left foot: Secondary | ICD-10-CM

## 2017-04-03 DIAGNOSIS — I10 Essential (primary) hypertension: Secondary | ICD-10-CM

## 2017-04-03 DIAGNOSIS — E349 Endocrine disorder, unspecified: Secondary | ICD-10-CM

## 2017-04-03 DIAGNOSIS — M79605 Pain in left leg: Secondary | ICD-10-CM

## 2017-04-03 DIAGNOSIS — R638 Other symptoms and signs concerning food and fluid intake: Secondary | ICD-10-CM

## 2017-04-03 DIAGNOSIS — E559 Vitamin D deficiency, unspecified: Secondary | ICD-10-CM

## 2017-04-03 LAB — URINALYSIS, COMPLETE
Bilirubin, UA: NEGATIVE
KETONES UA: NEGATIVE
LEUKOCYTES UA: NEGATIVE
Nitrite, UA: NEGATIVE
PROTEIN UA: NEGATIVE
RBC, UA: NEGATIVE
SPEC GRAV UA: 1.01 (ref 1.005–1.030)
Urobilinogen, Ur: 0.2 mg/dL (ref 0.2–1.0)
pH, UA: 5 (ref 5.0–7.5)

## 2017-04-03 LAB — MICROSCOPIC EXAMINATION
Bacteria, UA: NONE SEEN
EPITHELIAL CELLS (NON RENAL): NONE SEEN /HPF (ref 0–10)
RBC, UA: NONE SEEN /hpf (ref 0–?)
Renal Epithel, UA: NONE SEEN /hpf
WBC, UA: NONE SEEN /hpf (ref 0–?)

## 2017-04-03 MED ORDER — FLUTICASONE PROPIONATE 50 MCG/ACT NA SUSP: 2.0000 | Freq: Every day | NASAL | 6 refills | Status: DC

## 2017-04-03 NOTE — Progress Notes (Signed)
Subjective:    Patient ID: Sung Amabile., male    DOB: 26-Sep-1951, 66 y.o.   MRN: [id number]  HPI Patient is here today for annual wellness exam and follow up of chronic medical problems which includes diabetes, hyperlipidemia and hypertension. He is taking medication regularly.The patient comes in today complaining of some left foot pain and questionable swelling for one month. He also is complaining of some left chest tightness at times. He also has seasonal allergies and sinus congestion. He is due for a physical exam and a diabetic foot check. The patient has been complaining with the left lateral foot hurting and being tender for about a month. He does not recall any injury to this foot. There has been some slight swelling and there seems to be a hard area on the plantar surface of the foot. The chest tightness that he has had does not seem to be related to activity. It is more over the sternum and to the left of the sternum. He says is been going on off and on for about a week. He has not had any shortness of breath or nausea and vomiting. The seasonal allergies have been mostly head congestion and drainage. He is currently not taking anything for this. As far as the chest irritation is concerned it is not sore or tender. It is worse when he sitting and has lasted a few days as I mentioned. It is been several years since he has a stress test. There is a strong family history of heart disease in his father and his paternal uncles. The patient denies any GI symptoms other than heartburn with certain foods. He has not seen any blood in stool or had any black tarry bowel movements. He denies any shortness of breath.  Patient Active Problem List   Diagnosis Date Noted  . Anxiety states   . Depressive disorder, not elsewhere classified   . Hemorrhage of rectum and anus   . Polyp of colon   . Hyperplasia of prostate   . DM (diabetes mellitus) ([place])   . Decreased free testosterone level in  male   . Obesity 01/09/2011  . PRECORDIAL PAIN 01/09/2011  . HYPERLIPIDEMIA 01/07/2011  . TMJ PAIN 01/07/2011   Outpatient Encounter Prescriptions as of 04/03/2017  Medication Sig  . aspirin 81 MG EC tablet Take 81 mg by mouth daily.    . enalapril (VASOTEC) 5 MG tablet Take 1 tablet (5 mg total) by mouth daily.  Marland Kitchen glucose blood (ONE TOUCH ULTRA TEST) test strip CHECK BG TWICE DAILY  . omega-3 acid ethyl esters (LOVAZA) 1 g capsule TAKE 2 CAPSULES (2 G TOTAL) BY MOUTH 2 (TWO) TIMES DAILY.  Marland Kitchen ONETOUCH DELICA LANCETS 41R MISC Test BS bid. Dx E11.9  . rosuvastatin (CRESTOR) 10 MG tablet Take 1 tablet (10 mg total) by mouth daily.  . Vitamin D, Ergocalciferol, (DRISDOL) 50000 units CAPS capsule TAKE 1 CAPSULE (50,000 UNITS TOTAL) BY MOUTH EVERY 7 (SEVEN) DAYS.  Marland Kitchen Vitamin D, Ergocalciferol, (DRISDOL) 50000 units CAPS capsule TAKE 1 CAPSULE (50,000 UNITS TOTAL) BY MOUTH EVERY 7 (SEVEN) DAYS.  Marland Kitchen XIGDUO XR 04-999 MG TB24 TAKE 2 TABLETS BY MOUTH DAILY. WITH FOOD   No facility-administered encounter medications on file as of 04/03/2017.       Review of Systems  Constitutional: Negative.   HENT: Positive for sinus pressure (seasonal allergies).   Eyes: Negative.   Respiratory: Positive for chest tightness (left chest occasionally x 1 week ).  Cardiovascular: Negative.   Gastrointestinal: Negative.   Endocrine: Negative.   Genitourinary: Negative.   Musculoskeletal: Negative.        Left foot pain and swelling  Skin: Negative.   Allergic/Immunologic: Negative.   Neurological: Negative.   Hematological: Negative.   Psychiatric/Behavioral: Negative.        Objective:   Physical Exam  Constitutional: He is oriented to person, place, and time. He appears well-developed and well-nourished. No distress.  HENT:  Head: Normocephalic and atraumatic.  Right Ear: External ear normal.  Left Ear: External ear normal.  Mouth/Throat: Oropharynx is clear and moist. No oropharyngeal exudate.    Nasal congestion bilaterally  Eyes: Conjunctivae and EOM are normal. Pupils are equal, round, and reactive to light. Right eye exhibits no discharge. Left eye exhibits no discharge. No scleral icterus.  Neck: Normal range of motion. Neck supple. No thyromegaly present.  No thyromegaly bruits or anterior cervical adenopathy  Cardiovascular: Normal rate, regular rhythm, normal heart sounds and intact distal pulses.   No murmur heard. The heart had a regular rate and rhythm at 84/m  Pulmonary/Chest: Effort normal and breath sounds normal. No respiratory distress. He has no wheezes. He has no rales. He exhibits no tenderness.  Clear anteriorly and posteriorly no axillary adenopathy  Abdominal: Soft. Bowel sounds are normal. He exhibits no mass. There is no tenderness. There is no rebound and no guarding.  Abdominal obesity without tenderness or organ enlargement bruits or inguinal adenopathy  Genitourinary: Rectum normal and penis normal.  Genitourinary Comments: The prostate is slightly enlarged but smooth. There is no lumps or masses. There are no rectal masses. The external genitalia were within normal limits without any inguinal hernias being palpated.  Musculoskeletal: Normal range of motion. He exhibits tenderness. He exhibits no edema.  There was some tenderness at the left lateral foot near the fifth metatarsal. There is also tenderness on the plantar surface of the foot in a similar area.  Lymphadenopathy:    He has no cervical adenopathy.  Neurological: He is alert and oriented to person, place, and time. He has normal reflexes. No cranial nerve deficit.  Skin: Skin is warm and dry. No rash noted.  Psychiatric: He has a normal mood and affect. His behavior is normal. Judgment and thought content normal.  Nursing note and vitals reviewed.  BP 125/87 (BP Location: Left Arm)   Pulse 92   Temp 97.6 F (36.4 C) (Oral)   Ht 5' 9.5" (1.765 m)   Wt 239 lb (108.4 kg)   BMI 34.79 kg/m     EKG with results pending== EKG within normal limits =     Assessment & Plan:  1. Annual physical exam -X-rays of left foot with results pending - CBC with Differential/Platelet; Future - BMP8+EGFR; Future - Hepatic function panel; Future - VITAMIN D 25 Hydroxy (Vit-D Deficiency, Fractures); Future - NMR, lipoprofile; Future - Bayer DCA Hb A1c Waived; Future - PSA, total and free; Future - Urinalysis, Complete - EKG 12-Lead  2. Type 2 diabetes mellitus without complication, without long-term current use of insulin ([place]) -The patient should continue aggressive therapeutic lifestyle changes and current treatment pending results of lab work - CBC with Differential/Platelet; Future - BMP8+EGFR; Future - Bayer DCA Hb A1c Waived; Future - Ambulatory referral to Cardiology  3. Testosterone deficiency -The patient is currently not doing any testosterone replacement. - CBC with Differential/Platelet; Future - PSA, total and free; Future  4. Pure hypercholesterolemia -Continue with current treatment which is  Crestor and omega-3 fatty acids and with as aggressive therapeutic lifestyle changes as possible pending results of lab work - CBC with Differential/Platelet; Future - NMR, lipoprofile; Future - EKG 12-Lead - Ambulatory referral to Cardiology  5. Essential hypertension, benign -The patient is good and he will continue with current treatment good - CBC with Differential/Platelet; Future - BMP8+EGFR; Future - Hepatic function panel; Future - EKG 12-Lead - Ambulatory referral to Cardiology  6. Benign prostatic hyperplasia, unspecified whether lower urinary tract symptoms present -Patient is having no symptoms with this. - CBC with Differential/Platelet; Future - PSA, total and free; Future - Urinalysis, Complete  7. Vitamin D deficiency -Continue current treatment pending results of lab work - CBC with Differential/Platelet; Future - VITAMIN D 25 Hydroxy (Vit-D  Deficiency, Fractures); Future  8. Left foot pain - DG Foot Complete Left; Future - Uric acid; Future  9. Increased BMI -He must continue to make all efforts with weight loss through diet and exercise.  10. Pain of left lower extremity -X-ray foot as planned  Patient Instructions  Continue current medications. Continue good therapeutic lifestyle changes which include good diet and exercise. Fall precautions discussed with patient. If an FOBT was given today- please return it to our front desk. If you are over 13 years old - you may need Prevnar 53 or the adult Pneumonia vaccine.  **Flu shots are available--- please call and schedule a FLU-CLINIC appointment**  After your visit with [place] today you will receive a survey in the mail or online from [employer] regarding your care with [place]. Please take a moment to fill this out. Your feedback is very important to [place] as you can help [place] better understand your patient needs as well as improve your experience and satisfaction. WE CARE ABOUT YOU!!!     Arrie Senate MD

## 2017-04-03 NOTE — Patient Instructions (Addendum)
Continue current medications. Continue good therapeutic lifestyle changes which include good diet and exercise. Fall precautions discussed with patient. If an FOBT was given today- please return it to our front desk. If you are over 65 years old - you may need Prevnar 33 or the adult Pneumonia vaccine.  **Flu shots are available--- please call and schedule a FLU-CLINIC appointment**  After your visit with [place] today you will receive a survey in the mail or online from [employer] regarding your care with [place]. Please take a moment to fill this out. Your feedback is very important to [place] as you can help [place] better understand your patient needs as well as improve your experience and satisfaction. WE CARE ABOUT YOU!!!   We will call with results of your lab work and x-rays as soon as they become available Continue to exercise regularly and watch diet closely and drink plenty of water We will make an appointment with the cardiologist for further evaluation because of the chest discomfort you've had recently and the positive family history for your family's heart disease. Check blood sugars regularly Try to lose more weight to reduce your BMI

## 2017-04-04 ENCOUNTER — Telehealth: Payer: Self-pay | Admitting: Family Medicine

## 2017-04-04 NOTE — Telephone Encounter (Signed)
Patient aware of results.

## 2017-04-26 ENCOUNTER — Other Ambulatory Visit: Payer: BLUE CROSS/BLUE SHIELD

## 2017-04-26 DIAGNOSIS — M79672 Pain in left foot: Secondary | ICD-10-CM

## 2017-04-27 LAB — URIC ACID: URIC ACID: 3.5 mg/dL — AB (ref 3.7–8.6)

## 2017-04-30 ENCOUNTER — Telehealth: Payer: Self-pay | Admitting: *Deleted

## 2017-04-30 ENCOUNTER — Telehealth: Payer: Self-pay | Admitting: Family Medicine

## 2017-04-30 NOTE — Telephone Encounter (Signed)
Attempted to return call - NA and VM was full - unable to leave a message

## 2017-04-30 NOTE — Telephone Encounter (Signed)
Called and left message regarding time change for 06/20/17 appointment with Dr. Claria Dice to 9:40am.  Requested confirmation call from patient

## 2017-04-30 NOTE — Telephone Encounter (Signed)
Same as previous note- NA

## 2017-04-30 NOTE — Telephone Encounter (Signed)
Spoke with pt - issues with labs - will discuss with lab

## 2017-05-02 LAB — HEPATIC FUNCTION PANEL
ALBUMIN: 4.5 g/dL (ref 3.6–4.8)
ALT: 21 IU/L (ref 0–44)
AST: 17 IU/L (ref 0–40)
Alkaline Phosphatase: 80 IU/L (ref 39–117)
BILIRUBIN TOTAL: 0.3 mg/dL (ref 0.0–1.2)
Bilirubin, Direct: 0.11 mg/dL (ref 0.00–0.40)
TOTAL PROTEIN: 6.4 g/dL (ref 6.0–8.5)

## 2017-05-02 LAB — LIPID PANEL
CHOLESTEROL TOTAL: 123 mg/dL (ref 100–199)
Chol/HDL Ratio: 2.8 ratio (ref 0.0–5.0)
HDL: 44 mg/dL (ref >39)
LDL Calculated: 62 mg/dL (ref 0–99)
TRIGLYCERIDES: 85 mg/dL (ref 0–149)
VLDL CHOLESTEROL CAL: 17 mg/dL (ref 5–40)

## 2017-05-02 LAB — BMP8+EGFR
BUN/Creatinine Ratio: 13 (ref 10–24)
BUN: 10 mg/dL (ref 8–27)
CALCIUM: 9.2 mg/dL (ref 8.6–10.2)
CHLORIDE: 102 mmol/L (ref 96–106)
CO2: 19 mmol/L (ref 18–29)
Creatinine, Ser: 0.76 mg/dL (ref 0.76–1.27)
GFR calc Af Amer: 111 mL/min/{1.73_m2} (ref >59)
GFR calc non Af Amer: 96 mL/min/{1.73_m2} (ref >59)
GLUCOSE: 178 mg/dL — AB (ref 65–99)
Potassium: 4.3 mmol/L (ref 3.5–5.2)
Sodium: 143 mmol/L (ref 134–144)

## 2017-05-02 LAB — SPECIMEN STATUS REPORT

## 2017-05-02 LAB — VITAMIN D 25 HYDROXY (VIT D DEFICIENCY, FRACTURES): Vit D, 25-Hydroxy: 39.8 ng/mL (ref 30.0–100.0)

## 2017-05-02 LAB — PSA, TOTAL AND FREE
PSA FREE PCT: 20 %
PSA FREE: 0.08 ng/mL
Prostate Specific Ag, Serum: 0.4 ng/mL (ref 0.0–4.0)

## 2017-05-05 ENCOUNTER — Other Ambulatory Visit: Payer: Self-pay

## 2017-05-05 ENCOUNTER — Telehealth: Payer: Self-pay | Admitting: Family Medicine

## 2017-05-05 DIAGNOSIS — R739 Hyperglycemia, unspecified: Secondary | ICD-10-CM

## 2017-05-05 NOTE — Telephone Encounter (Signed)
Patient notified of lab results and advised patient to come by the office for a fingerstick. Placed order for Hgb A1c. Patient verbalized understanding

## 2017-06-01 ENCOUNTER — Other Ambulatory Visit: Payer: Self-pay | Admitting: Family Medicine

## 2017-06-03 ENCOUNTER — Ambulatory Visit: Payer: BLUE CROSS/BLUE SHIELD | Admitting: Family Medicine

## 2017-06-04 ENCOUNTER — Encounter: Payer: Self-pay | Admitting: Cardiology

## 2017-06-19 NOTE — Progress Notes (Signed)
Cardiology Office Note   Date:  06/21/2017   ID:  Richard Nelson., DOB Feb 16, 1951, MRN [id number]  PCP:  Chipper Herb, MD  Cardiologist:   Minus Breeding, MD  Referring:  Chipper Herb, MD  Chief Complaint  Patient presents with  . Chest Pain      History of Present Illness: Richard Nelson. is a 66 y.o. male who presents for evaluation of chest pain. He is referred by Dr. Laurance Flatten.  He has had an abnormal POET (Plain Old Exercise Treadmill) in 2004 but a negative  perfusion study.  I last saw him in 2012.  He was again having chest pain.  I sent him for a POET (Plain Old Exercise Treadmill).  This was negative for ischemia.     He does get some chest discomfort. This is a sharp and fleeting discomfort.He describes a needle like jabbing that may be in his anterior chest or in his back.  He cannot bring this on. It happens sporadically. It lasts for only a few seconds. It is not associated with nausea vomiting or diaphoresis. He does not get shortness of breath, PND or orthopnea. He's maintained weight loss he had over the years. He walks his dogs routinely.  Of note, I reviewed his labs and his A1C is not well controlled.    Past Medical History:  Diagnosis Date  . Anxiety states   . 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 Depressive disorder, not elsewhere classified   . DM (diabetes mellitus) ([place])   . Hemorrhage of rectum and anus   . Hyperplasia of prostate   . Polyp of colon     Past Surgical History:  Procedure Laterality Date  . COLONOSCOPY    . CYST REMOVAL TRUNK    . KNEE SURGERY     LEFT  . POLYPECTOMY       Current Outpatient Prescriptions  Medication Sig Dispense Refill  . aspirin 81 MG EC tablet Take 81 mg by mouth daily.      . enalapril (VASOTEC) 5 MG tablet Take 1 tablet (5 mg total) by mouth daily. 90 tablet 3  . fluticasone (FLONASE) 50 MCG/ACT nasal spray Place 2 sprays into both nostrils daily. 16 g 6  . glucose blood (ONE TOUCH  ULTRA TEST) test strip CHECK BG TWICE DAILY 100 each 3  . omega-3 acid ethyl esters (LOVAZA) 1 g capsule TAKE 2 CAPSULES (2 G TOTAL) BY MOUTH 2 (TWO) TIMES DAILY. 360 capsule 2  . ONETOUCH DELICA LANCETS 54O MISC Test BS bid. Dx E11.9 100 each 0  . rosuvastatin (CRESTOR) 10 MG tablet Take 1 tablet (10 mg total) by mouth daily. 90 tablet 3  . Vitamin D, Ergocalciferol, (DRISDOL) 50000 units CAPS capsule TAKE 1 CAPSULE (50,000 UNITS TOTAL) BY MOUTH EVERY 7 (SEVEN) DAYS. 12 capsule 1  . XIGDUO XR 04-999 MG TB24 Take 2 tablets by mouth daily.  4   No current facility-administered medications for this visit.     Allergies:   Sulfonamide derivatives and Erythromycin    Social History:  The patient  reports that he has never smoked. He has never used smokeless tobacco. He reports that he does not drink alcohol or use drugs.   Family History:  The patient's family history includes Cancer in his maternal aunt; Cancer (age of onset: 78) in his brother; Colon cancer in his paternal uncle; Diabetes in his brother and brother; Heart disease (age of onset:  39) in his father; Stroke in his maternal grandmother.    ROS:  Please see the history of present illness.   Otherwise, review of systems are positive for none.   All other systems are reviewed and negative.    PHYSICAL EXAM: VS:  BP 118/70   Pulse 79   Ht 5' 9.5" (1.765 m)   Wt 238 lb (108 kg)   BMI 34.64 kg/m  , BMI Body mass index is 34.64 kg/m. GENERAL:  Well appearing HEENT:  Pupils equal round and reactive, fundi not visualized, oral mucosa unremarkable NECK:  No jugular venous distention, waveform within normal limits, carotid upstroke brisk and symmetric, no bruits, no thyromegaly LYMPHATICS:  No cervical, inguinal adenopathy LUNGS:  Clear to auscultation bilaterally BACK:  No CVA tenderness CHEST:  Unremarkable HEART:  PMI not displaced or sustained,S1 and S2 within normal limits, no S3, no S4, no clicks, no rubs, no murmurs ABD:   Flat, positive bowel sounds normal in frequency in pitch, no bruits, no rebound, no guarding, no midline pulsatile mass, no hepatomegaly, no splenomegaly EXT:  2 plus pulses throughout, no edema, no cyanosis no clubbing SKIN:  No rashes no nodules NEURO:  Cranial nerves II through XII grossly intact, motor grossly intact throughout PSYCH:  Cognitively intact, oriented to person place and time    EKG:  EKG is ordered today. The ekg ordered today demonstrates Sinus rhythm, rate 79, axis within normal limits, intervals within normal, no acute ST-T wave changes.   Recent Labs: 12/12/2016: Hemoglobin 15.1; Platelets 212 04/26/2017: ALT 21; BUN 10; Creatinine, Ser 0.76; Potassium 4.3; Sodium 143    Lipid Panel    Component Value Date/Time   CHOL 123 04/26/2017 0840   TRIG 85 04/26/2017 0840   TRIG 151 (H) 12/12/2016 0946   HDL 44 04/26/2017 0840   HDL 41 12/12/2016 0946   CHOLHDL 2.8 04/26/2017 0840   LDLCALC 62 04/26/2017 0840   LDLCALC 83 02/07/2014 0913    Wt Readings from Last 3 Encounters:  06/20/17 238 lb (108 kg)  04/03/17 239 lb (108.4 kg)  11/29/16 236 lb (107 kg)      Other studies Reviewed: Additional studies/ records that were reviewed today include: Labs. Review of the above records demonstrates:  Please see elsewhere in the note.     ASSESSMENT AND PLAN:   CHEST PAIN:  This is atypical.  However, he has poorly controlled diabetes. I will bring the patient back for a POET (Plain Old Exercise Test). This will allow me to screen for obstructive coronary disease, risk stratify and very importantly provide a prescription for exercise.  DYSLIPIDEMIA:  .Lipids are as above.  These are at target.  I will defer to Chipper Herb, MD  HTN:   The blood pressure is at target. No change in medications is indicated. We will continue with therapeutic lifestyle changes (TLC).  DM: This is not well controlled and I talked to him at length about this.  AIC in Dec was 10.3.  I  will defer med changes to Chipper Herb, MD  OBESITY:  We talked about the Mediterranean diet  Current medicines are reviewed at length with the patient today.  The patient does not have concerns regarding medicines.  The following changes have been made:  no change  Labs/ tests ordered today include:   Orders Placed This Encounter  Procedures  . Exercise Tolerance Test  . EKG 12-Lead     Disposition:   FU with me  as needed    Signed, Minus Breeding, MD  06/21/2017 7:42 PM    [facility]

## 2017-06-20 ENCOUNTER — Encounter: Payer: Self-pay | Admitting: Cardiology

## 2017-06-20 ENCOUNTER — Ambulatory Visit (INDEPENDENT_AMBULATORY_CARE_PROVIDER_SITE_OTHER): Payer: BLUE CROSS/BLUE SHIELD | Admitting: Cardiology

## 2017-06-20 VITALS — BP 118/70 | HR 79 | Ht 69.5 in | Wt 238.0 lb

## 2017-06-20 DIAGNOSIS — I10 Essential (primary) hypertension: Secondary | ICD-10-CM

## 2017-06-20 DIAGNOSIS — E785 Hyperlipidemia, unspecified: Secondary | ICD-10-CM

## 2017-06-20 DIAGNOSIS — R079 Chest pain, unspecified: Secondary | ICD-10-CM

## 2017-06-20 NOTE — Patient Instructions (Signed)
Medication Instructions:  Your physician recommends that you continue on your current medications as directed. Please refer to the Current Medication list given to you today.  Labwork: NONE   Testing/Procedures: Your physician has requested that you have an exercise tolerance test. For further information please visit [email or web address]. Please also follow instruction sheet, as given.  PLEASE SCHEDULE BY THE END OF July PER DR HOCHREIN  Follow-Up: DR Percival Spanish RECOMMENDS THAT YOU FOLLOW UP AS NEEDED  Any Other Special Instructions Will Be Listed Below (If Applicable).  If you need a refill on your cardiac medications before your next appointment, please call your pharmacy.

## 2017-06-21 ENCOUNTER — Encounter: Payer: Self-pay | Admitting: Cardiology

## 2017-06-21 DIAGNOSIS — E785 Hyperlipidemia, unspecified: Secondary | ICD-10-CM | POA: Insufficient documentation

## 2017-06-21 DIAGNOSIS — E1159 Type 2 diabetes mellitus with other circulatory complications: Secondary | ICD-10-CM | POA: Insufficient documentation

## 2017-06-21 DIAGNOSIS — I152 Hypertension secondary to endocrine disorders: Secondary | ICD-10-CM | POA: Insufficient documentation

## 2017-06-21 DIAGNOSIS — I10 Essential (primary) hypertension: Secondary | ICD-10-CM

## 2017-06-27 ENCOUNTER — Other Ambulatory Visit: Payer: Self-pay | Admitting: Family Medicine

## 2017-07-08 ENCOUNTER — Telehealth (HOSPITAL_COMMUNITY): Payer: Self-pay

## 2017-07-08 NOTE — Telephone Encounter (Signed)
Encounter complete. 

## 2017-07-10 ENCOUNTER — Ambulatory Visit (HOSPITAL_COMMUNITY)
Admission: RE | Admit: 2017-07-10 | Discharge: 2017-07-10 | Disposition: A | Discharge status: Home / Self Care | Payer: BLUE CROSS/BLUE SHIELD | Source: Ambulatory Visit | Attending: Cardiology | Admitting: Cardiology

## 2017-07-10 DIAGNOSIS — R079 Chest pain, unspecified: Secondary | ICD-10-CM | POA: Diagnosis present

## 2017-07-10 DIAGNOSIS — R9439 Abnormal result of other cardiovascular function study: Secondary | ICD-10-CM | POA: Diagnosis not present

## 2017-07-10 LAB — EXERCISE TOLERANCE TEST
CHL RATE OF PERCEIVED EXERTION: 18
CSEPED: 7 min
Estimated workload: 8.7 METS
Exercise duration (sec): 7 s
MPHR: 156 {beats}/min
Peak HR: 166 {beats}/min
Percent HR: 106 %
Rest HR: 84 {beats}/min

## 2017-07-14 ENCOUNTER — Telehealth: Payer: Self-pay | Admitting: *Deleted

## 2017-07-14 DIAGNOSIS — R9439 Abnormal result of other cardiovascular function study: Secondary | ICD-10-CM

## 2017-07-14 NOTE — Telephone Encounter (Signed)
Lexiscan Myoview ordered and send to scheduler to be schedule 

## 2017-07-14 NOTE — Telephone Encounter (Signed)
-----   Message from Minus Breeding, MD sent at 07/12/2017  8:15 PM EDT ----- His stress test is abnormal.  However, he has had a false positive result in the past and his symptoms are atypical.  We will schedule a Lexiscan Myoview.  Please call with results.

## 2017-07-15 ENCOUNTER — Telehealth: Payer: Self-pay | Admitting: Cardiology

## 2017-07-15 NOTE — Telephone Encounter (Signed)
Called the patient and LVM to call back and schedule his stress test.

## 2017-07-16 ENCOUNTER — Telehealth (HOSPITAL_COMMUNITY): Payer: Self-pay

## 2017-07-16 NOTE — Telephone Encounter (Signed)
Encounter complete. 

## 2017-07-17 ENCOUNTER — Other Ambulatory Visit: Payer: Self-pay | Admitting: Family Medicine

## 2017-07-18 ENCOUNTER — Ambulatory Visit (HOSPITAL_COMMUNITY)
Admission: RE | Admit: 2017-07-18 | Discharge: 2017-07-18 | Disposition: A | Discharge status: Home / Self Care | Payer: BLUE CROSS/BLUE SHIELD | Source: Ambulatory Visit | Attending: Cardiovascular Disease | Admitting: Cardiovascular Disease

## 2017-07-18 DIAGNOSIS — I10 Essential (primary) hypertension: Secondary | ICD-10-CM | POA: Diagnosis not present

## 2017-07-18 DIAGNOSIS — Z6834 Body mass index (BMI) 34.0-34.9, adult: Secondary | ICD-10-CM | POA: Insufficient documentation

## 2017-07-18 DIAGNOSIS — E663 Overweight: Secondary | ICD-10-CM | POA: Diagnosis not present

## 2017-07-18 DIAGNOSIS — E119 Type 2 diabetes mellitus without complications: Secondary | ICD-10-CM | POA: Diagnosis not present

## 2017-07-18 DIAGNOSIS — R079 Chest pain, unspecified: Secondary | ICD-10-CM | POA: Insufficient documentation

## 2017-07-18 DIAGNOSIS — R9439 Abnormal result of other cardiovascular function study: Secondary | ICD-10-CM | POA: Insufficient documentation

## 2017-07-18 DIAGNOSIS — Z8249 Family history of ischemic heart disease and other diseases of the circulatory system: Secondary | ICD-10-CM | POA: Insufficient documentation

## 2017-07-18 LAB — MYOCARDIAL PERFUSION IMAGING
CHL CUP RESTING HR STRESS: 72 {beats}/min
LV sys vol: 41 mL
LVDIAVOL: 92 mL (ref 62–150)
NUC STRESS TID: 0.94
Peak HR: 95 {beats}/min
SDS: 1
SRS: 0
SSS: 1

## 2017-07-18 MED ORDER — TECHNETIUM TC 99M TETROFOSMIN IV KIT
30.5000 | PACK | Freq: Once | INTRAVENOUS | Status: AC | PRN
  Administered 2017-07-18: 30.5 via INTRAVENOUS
  Filled 2017-07-18: qty 31

## 2017-07-18 MED ORDER — REGADENOSON 0.4 MG/5ML IV SOLN
0.4000 mg | Freq: Once | INTRAVENOUS | Status: AC
  Administered 2017-07-18: 0.4 mg via INTRAVENOUS

## 2017-07-18 MED ORDER — TECHNETIUM TC 99M TETROFOSMIN IV KIT
10.1000 | PACK | Freq: Once | INTRAVENOUS | Status: AC | PRN
  Administered 2017-07-18: 10.1 via INTRAVENOUS
  Filled 2017-07-18: qty 11

## 2017-07-22 ENCOUNTER — Encounter (HOSPITAL_COMMUNITY): Payer: BLUE CROSS/BLUE SHIELD

## 2017-07-31 ENCOUNTER — Other Ambulatory Visit: Payer: Self-pay | Admitting: *Deleted

## 2017-07-31 MED ORDER — ENALAPRIL MALEATE 5 MG PO TABS
5.0000 mg | ORAL_TABLET | Freq: Every day | ORAL | 0 refills | Status: DC
Start: 2017-07-31 — End: 2017-10-28

## 2017-08-06 ENCOUNTER — Ambulatory Visit (INDEPENDENT_AMBULATORY_CARE_PROVIDER_SITE_OTHER): Payer: BC Managed Care – PPO | Admitting: Family Medicine

## 2017-08-06 ENCOUNTER — Encounter: Payer: Self-pay | Admitting: Family Medicine

## 2017-08-06 VITALS — BP 129/72 | HR 86 | Temp 97.8°F | Ht 70.0 in | Wt 240.0 lb

## 2017-08-06 DIAGNOSIS — E559 Vitamin D deficiency, unspecified: Secondary | ICD-10-CM | POA: Diagnosis not present

## 2017-08-06 DIAGNOSIS — N400 Enlarged prostate without lower urinary tract symptoms: Secondary | ICD-10-CM | POA: Diagnosis not present

## 2017-08-06 DIAGNOSIS — E349 Endocrine disorder, unspecified: Secondary | ICD-10-CM | POA: Diagnosis not present

## 2017-08-06 DIAGNOSIS — I10 Essential (primary) hypertension: Secondary | ICD-10-CM | POA: Diagnosis not present

## 2017-08-06 DIAGNOSIS — N5201 Erectile dysfunction due to arterial insufficiency: Secondary | ICD-10-CM | POA: Diagnosis not present

## 2017-08-06 DIAGNOSIS — E7800 Pure hypercholesterolemia, unspecified: Secondary | ICD-10-CM

## 2017-08-06 DIAGNOSIS — E119 Type 2 diabetes mellitus without complications: Secondary | ICD-10-CM

## 2017-08-06 MED ORDER — VITAMIN D (ERGOCALCIFEROL) 1.25 MG (50000 UNIT) PO CAPS: ORAL_CAPSULE | ORAL | 1 refills | Status: DC

## 2017-08-06 MED ORDER — SILDENAFIL CITRATE 20 MG PO TABS: ORAL_TABLET | ORAL | 0 refills | Status: DC

## 2017-08-06 MED ORDER — DAPAGLIFLOZIN PRO-METFORMIN ER 5-1000 MG PO TB24: 2.0000 | ORAL_TABLET | Freq: Every day | ORAL | 3 refills | Status: DC

## 2017-08-06 NOTE — Progress Notes (Signed)
Subjective:    Patient ID: Richard Nelson., male    DOB: August 31, 1951, 66 y.o.   MRN: [id number]  HPI Pt here for follow up and management of chronic medical problems which includes diabetes, hyperlipidemia and hypertension. He is taking medication regularly.This patient has a history of diabetes and hyperlipidemia. His BMI is 34.2. He has a strong family history of heart disease. Because of the family history he was sent to see the cardiologist and he had a negative stress perfusion study. This is good news in light of the family history. He is taking xigduo which is a combination of metformin/jardience, SGLT 2 inhibitor. He will be given an FOBT to return and will come back to the office for his fasting lab work. He is requesting a refill also on his vitamin D. His weight is up 2 pounds since the last visit. The patient denies any shortness of breath chest pain pressure or palpitations. He does indicate he has some trouble swallowing liquids and feels like he gets choked and has to spit the liquid back up. There is no solid food problem just liquids. This does not happen often but it does happen. His last colonoscopy was in July 2014 and he was due to get another one 5 years later which would be next summer. He also had an eye exam last July and understands he is due to get another eye exam mail. He has not seen any blood in the stool or had any black tarry bowel movements. He is passing his water without problems.   Patient Active Problem List   Diagnosis Date Noted  . Dyslipidemia 06/21/2017  . Essential hypertension 06/21/2017  . Anxiety states   . Depressive disorder, not elsewhere classified   . Hemorrhage of rectum and anus   . Polyp of colon   . Hyperplasia of prostate   . DM (diabetes mellitus) ([place])   . 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 Obesity 01/09/2011  . PRECORDIAL PAIN 01/09/2011  . HYPERLIPIDEMIA 01/07/2011  . TMJ PAIN 01/07/2011   Outpatient Encounter  Prescriptions as of 08/06/2017  Medication Sig  . aspirin 81 MG EC tablet Take 81 mg by mouth daily.    . enalapril (VASOTEC) 5 MG tablet Take 1 tablet (5 mg total) by mouth daily.  . fluticasone (FLONASE) 50 MCG/ACT nasal spray Place 2 sprays into both nostrils daily.  Marland Kitchen glucose blood (ONE TOUCH ULTRA TEST) test strip CHECK BG TWICE DAILY  . omega-3 acid ethyl esters (LOVAZA) 1 g capsule TAKE 2 CAPSULES (2 G TOTAL) BY MOUTH 2 (TWO) TIMES DAILY.  Marland Kitchen ONETOUCH DELICA LANCETS 93T MISC Test BS bid. Dx E11.9  . rosuvastatin (CRESTOR) 10 MG tablet TAKE 1 TABLET EVERY DAY  . Vitamin D, Ergocalciferol, (DRISDOL) 50000 units CAPS capsule TAKE 1 CAPSULE (50,000 UNITS TOTAL) BY MOUTH EVERY 7 (SEVEN) DAYS.  Marland Kitchen XIGDUO XR 04-999 MG TB24 TAKE 2 TABLETS BY MOUTH DAILY. WITH FOOD  . [DISCONTINUED] XIGDUO XR 04-999 MG TB24 Take 2 tablets by mouth daily.   No facility-administered encounter medications on file as of 08/06/2017.        Review of Systems  Constitutional: Negative.   HENT: Negative.   Eyes: Negative.   Respiratory: Negative.   Cardiovascular: Negative.   Gastrointestinal: Negative.   Endocrine: Negative.   Genitourinary: Negative.   Musculoskeletal: Negative.   Skin: Negative.   Allergic/Immunologic: Negative.   Neurological: Negative.   Hematological: Negative.   Psychiatric/Behavioral: Negative.  Objective:   Physical Exam  Constitutional: He is oriented to person, place, and time. He appears well-developed and well-nourished. No distress.  Pleasant and alert  HENT:  Head: Normocephalic and atraumatic.  Right Ear: External ear normal.  Left Ear: External ear normal.  Mouth/Throat: Oropharynx is clear and moist. No oropharyngeal exudate.  Nasal congestion  Eyes: Pupils are equal, round, and reactive to light. Conjunctivae and EOM are normal. Right eye exhibits no discharge. Left eye exhibits no discharge. No scleral icterus.  Patient is due I exam now and we'll make sure  we get a copy of the report once this is done  Neck: Normal range of motion. Neck supple. No thyromegaly present.  Cardiovascular: Normal rate, regular rhythm, normal heart sounds and intact distal pulses.   No murmur heard. Heart is regular at 72/m  Pulmonary/Chest: Effort normal and breath sounds normal. No respiratory distress. He has no wheezes. He has no rales. He exhibits no tenderness.  Clear anteriorly and posteriorly no axillary adenopathy  Abdominal: Soft. Bowel sounds are normal. He exhibits no mass. There is no tenderness. There is no rebound and no guarding.  Abdominal obesity without masses tenderness or organ enlargement or bruits. No epigastric tenderness specifically.  Musculoskeletal: Normal range of motion. He exhibits no edema.  Lymphadenopathy:    He has no cervical adenopathy.  Neurological: He is alert and oriented to person, place, and time. He has normal reflexes. No cranial nerve deficit.  Skin: Skin is warm and dry. No rash noted.  Psychiatric: He has a normal mood and affect. His behavior is normal. Judgment and thought content normal.  Nursing note and vitals reviewed.  BP 129/72 (BP Location: Left Arm)   Pulse 86   Temp 97.8 F (36.6 C) (Oral)   Ht '5\' 10"'  (1.778 m)   Wt 240 lb (108.9 kg)   BMI 34.44 kg/m         Assessment & Plan:  1. Type 2 diabetes mellitus without complication, without long-term current use of insulin ([place]) -The patient will continue with his current treatment pending results of lab work and aggressive therapeutic lifestyle changes including diet and exercise - Microalbumin / creatinine urine ratio - BMP8+EGFR; Future - CBC with Differential/Platelet; Future - Bayer DCA Hb A1c Waived; Future  2. Testosterone deficiency -No treatment currently but patient expressed the desire to try some generic Viagra we will call this prescription in for him. - CBC with Differential/Platelet; Future  3. Pure hypercholesterolemia -Continue  current treatment pending results of lab work - CBC with Differential/Platelet; Future - NMR, lipoprofile; Future  4. Essential hypertension, benign -Continue current treatment weight loss and sodium restriction - BMP8+EGFR; Future - CBC with Differential/Platelet; Future - Hepatic function panel; Future  5. Vitamin D deficiency -Continue current treatment pending results of lab work - CBC with Differential/Platelet; Future - VITAMIN D 25 Hydroxy (Vit-D Deficiency, Fractures); Future  6. Benign prostatic hyperplasia, unspecified whether lower urinary tract symptoms present -No complaints today with voiding - CBC with Differential/Platelet; Future  7. Erectile dysfunction due to arterial insufficiency -Sildenafil prescription called in to take 3-5 as needed.  Meds ordered this encounter  Medications  . Vitamin D, Ergocalciferol, (DRISDOL) 50000 units CAPS capsule    Sig: TAKE 1 CAPSULE (50,000 UNITS TOTAL) BY MOUTH EVERY 7 (SEVEN) DAYS.    Dispense:  12 capsule    Refill:  1  . Dapagliflozin-Metformin HCl ER (XIGDUO XR) 04-999 MG TB24    Sig: Take 2 tablets by  mouth daily.    Dispense:  60 tablet    Refill:  3  . sildenafil (REVATIO) 20 MG tablet    Sig: Take 2-5 tabs PRN prior to sexual activity    Dispense:  50 tablet    Refill:  0   Patient Instructions                       Medicare Annual Wellness Visit  [facility] and the medical providers at [facility] strive to bring you the best medical care.  In doing so we not only want to address your current medical conditions and concerns but also to detect new conditions early and prevent illness, disease and health-related problems.    Medicare offers a yearly Wellness Visit which allows our clinical staff to assess your need for preventative services including immunizations, lifestyle education, counseling to decrease risk of preventable diseases and screening for fall risk and other medical concerns.     This visit is provided free of charge (no copay) for all Medicare recipients. The clinical pharmacists at [facility] have begun to conduct these Wellness Visits which will also include a thorough review of all your medications.    As you primary medical provider recommend that you make an appointment for your Annual Wellness Visit if you have not done so already this year.  You may set up this appointment before you leave today or you may call back ([phone]) and schedule an appointment.  Please make sure when you call that you mention that you are scheduling your Annual Wellness Visit with the clinical pharmacist so that the appointment may be made for the proper length of time.     Continue current medications. Continue good therapeutic lifestyle changes which include good diet and exercise. Fall precautions discussed with patient. If an FOBT was given today- please return it to our front desk. If you are over 59 years old - you may need Prevnar 19 or the adult Pneumonia vaccine.  **Flu shots are available--- please call and schedule a FLU-CLINIC appointment**  After your visit with [place] today you will receive a survey in the mail or online from [employer] regarding your care with [place]. Please take a moment to fill this out. Your feedback is very important to [place] as you can help [place] better understand your patient needs as well as improve your experience and satisfaction. WE CARE ABOUT YOU!!!     Arrie Senate MD

## 2017-08-06 NOTE — Patient Instructions (Addendum)
Medicare Annual Wellness Visit  [facility] and the medical providers at [facility] strive to bring you the best medical care.  In doing so we not only want to address your current medical conditions and concerns but also to detect new conditions early and prevent illness, disease and health-related problems.    Medicare offers a yearly Wellness Visit which allows our clinical staff to assess your need for preventative services including immunizations, lifestyle education, counseling to decrease risk of preventable diseases and screening for fall risk and other medical concerns.    This visit is provided free of charge (no copay) for all Medicare recipients. The clinical pharmacists at [facility] have begun to conduct these Wellness Visits which will also include a thorough review of all your medications.    As you primary medical provider recommend that you make an appointment for your Annual Wellness Visit if you have not done so already this year.  You may set up this appointment before you leave today or you may call back ([phone]) and schedule an appointment.  Please make sure when you call that you mention that you are scheduling your Annual Wellness Visit with the clinical pharmacist so that the appointment may be made for the proper length of time.     Continue current medications. Continue good therapeutic lifestyle changes which include good diet and exercise. Fall precautions discussed with patient. If an FOBT was given today- please return it to our front desk. If you are over 48 years old - you may need Prevnar 68 or the adult Pneumonia vaccine.  **Flu shots are available--- please call and schedule a FLU-CLINIC appointment**  After your visit with [place] today you will receive a survey in the mail or online from [employer] regarding your care with [place]. Please take a moment to fill this out. Your feedback is very  important to [place] as you can help [place] better understand your patient needs as well as improve your experience and satisfaction. WE CARE ABOUT YOU!!!   Take ranitidine 150 mg the equate brand 1 twice daily before breakfast and supper for one month and if the problems with swallowing or choking continue call us back at that time. Return the FOBT.

## 2017-08-07 LAB — MICROALBUMIN / CREATININE URINE RATIO
Creatinine, Urine: 62.4 mg/dL
MICROALB/CREAT RATIO: 7.5 mg/g{creat} (ref 0.0–30.0)
Microalbumin, Urine: 4.7 ug/mL

## 2017-08-29 ENCOUNTER — Other Ambulatory Visit: Payer: BLUE CROSS/BLUE SHIELD

## 2017-08-29 DIAGNOSIS — E349 Endocrine disorder, unspecified: Secondary | ICD-10-CM | POA: Diagnosis not present

## 2017-08-29 DIAGNOSIS — E119 Type 2 diabetes mellitus without complications: Secondary | ICD-10-CM

## 2017-08-29 DIAGNOSIS — E7800 Pure hypercholesterolemia, unspecified: Secondary | ICD-10-CM | POA: Diagnosis not present

## 2017-08-29 DIAGNOSIS — I10 Essential (primary) hypertension: Secondary | ICD-10-CM

## 2017-08-29 DIAGNOSIS — E559 Vitamin D deficiency, unspecified: Secondary | ICD-10-CM

## 2017-08-29 DIAGNOSIS — N400 Enlarged prostate without lower urinary tract symptoms: Secondary | ICD-10-CM

## 2017-08-29 LAB — BAYER DCA HB A1C WAIVED: HB A1C (BAYER DCA - WAIVED): 9.1 % — ABNORMAL HIGH

## 2017-08-30 LAB — CBC WITH DIFFERENTIAL/PLATELET
BASOS ABS: 0 10*3/uL (ref 0.0–0.2)
Basos: 0 %
EOS (ABSOLUTE): 0.3 10*3/uL (ref 0.0–0.4)
Eos: 4 %
Hematocrit: 46.5 % (ref 37.5–51.0)
Hemoglobin: 15.9 g/dL (ref 13.0–17.7)
IMMATURE GRANULOCYTES: 0 %
Immature Grans (Abs): 0 10*3/uL (ref 0.0–0.1)
LYMPHS ABS: 2 10*3/uL (ref 0.7–3.1)
Lymphs: 29 %
MCH: 27.2 pg (ref 26.6–33.0)
MCHC: 34.2 g/dL (ref 31.5–35.7)
MCV: 80 fL (ref 79–97)
MONOS ABS: 0.5 10*3/uL (ref 0.1–0.9)
Monocytes: 7 %
NEUTROS PCT: 60 %
Neutrophils Absolute: 4.2 10*3/uL (ref 1.4–7.0)
PLATELETS: 219 10*3/uL (ref 150–379)
RBC: 5.84 x10E6/uL — AB (ref 4.14–5.80)
RDW: 13.2 % (ref 12.3–15.4)
WBC: 6.9 10*3/uL (ref 3.4–10.8)

## 2017-08-30 LAB — BMP8+EGFR
BUN/Creatinine Ratio: 13 (ref 10–24)
BUN: 10 mg/dL (ref 8–27)
CHLORIDE: 102 mmol/L (ref 96–106)
CO2: 22 mmol/L (ref 20–29)
CREATININE: 0.75 mg/dL — AB (ref 0.76–1.27)
Calcium: 9.2 mg/dL (ref 8.6–10.2)
GFR calc Af Amer: 112 mL/min/{1.73_m2} (ref >59)
GFR calc non Af Amer: 97 mL/min/{1.73_m2} (ref >59)
GLUCOSE: 183 mg/dL — AB (ref 65–99)
POTASSIUM: 4.1 mmol/L (ref 3.5–5.2)
SODIUM: 139 mmol/L (ref 134–144)

## 2017-08-30 LAB — HEPATIC FUNCTION PANEL
ALBUMIN: 4.6 g/dL (ref 3.6–4.8)
ALT: 22 IU/L (ref 0–44)
AST: 20 IU/L (ref 0–40)
Alkaline Phosphatase: 90 IU/L (ref 39–117)
BILIRUBIN TOTAL: 0.6 mg/dL (ref 0.0–1.2)
Bilirubin, Direct: 0.17 mg/dL (ref 0.00–0.40)
Total Protein: 6.5 g/dL (ref 6.0–8.5)

## 2017-08-30 LAB — VITAMIN D 25 HYDROXY (VIT D DEFICIENCY, FRACTURES): Vit D, 25-Hydroxy: 43.4 ng/mL (ref 30.0–100.0)

## 2017-08-31 LAB — NMR, LIPOPROFILE
CHOLESTEROL: 115 mg/dL (ref 100–199)
HDL CHOLESTEROL BY NMR: 42 mg/dL (ref >39)
HDL PARTICLE NUMBER: 30.7 umol/L (ref >=30.5)
LDL PARTICLE NUMBER: 1028 nmol/L — AB (ref <1000)
LDL Size: 19.9 nm (ref >20.5)
LDL-C: 49 mg/dL (ref 0–99)
LP-IR Score: 67 — ABNORMAL HIGH (ref <=45)
Small LDL Particle Number: 755 nmol/L — ABNORMAL HIGH (ref <=527)
TRIGLYCERIDES BY NMR: 120 mg/dL (ref 0–149)

## 2017-09-15 ENCOUNTER — Other Ambulatory Visit: Payer: Self-pay

## 2017-09-15 MED ORDER — GLYBURIDE 2.5 MG PO TABS
2.5000 mg | ORAL_TABLET | Freq: Every day | ORAL | 3 refills | Status: DC
Start: 2017-09-15 — End: 2018-01-10

## 2017-10-28 ENCOUNTER — Other Ambulatory Visit: Payer: Self-pay | Admitting: Family Medicine

## 2017-12-11 ENCOUNTER — Telehealth: Payer: Self-pay | Admitting: Family Medicine

## 2017-12-11 NOTE — Telephone Encounter (Signed)
Pt called - appt changed

## 2017-12-12 ENCOUNTER — Ambulatory Visit: Payer: BLUE CROSS/BLUE SHIELD | Admitting: Family Medicine

## 2017-12-13 ENCOUNTER — Other Ambulatory Visit: Payer: Self-pay | Admitting: Family Medicine

## 2017-12-23 ENCOUNTER — Other Ambulatory Visit: Payer: Self-pay | Admitting: *Deleted

## 2017-12-23 MED ORDER — FLUTICASONE PROPIONATE 50 MCG/ACT NA SUSP: 2.0000 | Freq: Every day | NASAL | 0 refills | Status: DC

## 2018-01-10 ENCOUNTER — Other Ambulatory Visit: Payer: Self-pay | Admitting: Family Medicine

## 2018-01-12 NOTE — Telephone Encounter (Signed)
last seen 08/06/17  DWM

## 2018-01-13 ENCOUNTER — Telehealth: Payer: Self-pay | Admitting: Family Medicine

## 2018-01-13 ENCOUNTER — Encounter: Payer: Self-pay | Admitting: Family Medicine

## 2018-01-13 ENCOUNTER — Other Ambulatory Visit: Payer: Self-pay | Admitting: Family Medicine

## 2018-01-13 ENCOUNTER — Ambulatory Visit (INDEPENDENT_AMBULATORY_CARE_PROVIDER_SITE_OTHER): Payer: BC Managed Care – PPO | Admitting: Family Medicine

## 2018-01-13 VITALS — BP 133/81 | HR 91 | Temp 98.2°F | Ht 70.0 in | Wt 246.0 lb

## 2018-01-13 DIAGNOSIS — I10 Essential (primary) hypertension: Secondary | ICD-10-CM

## 2018-01-13 DIAGNOSIS — E7800 Pure hypercholesterolemia, unspecified: Secondary | ICD-10-CM

## 2018-01-13 DIAGNOSIS — E349 Endocrine disorder, unspecified: Secondary | ICD-10-CM | POA: Diagnosis not present

## 2018-01-13 DIAGNOSIS — E559 Vitamin D deficiency, unspecified: Secondary | ICD-10-CM

## 2018-01-13 DIAGNOSIS — N400 Enlarged prostate without lower urinary tract symptoms: Secondary | ICD-10-CM | POA: Diagnosis not present

## 2018-01-13 DIAGNOSIS — M47812 Spondylosis without myelopathy or radiculopathy, cervical region: Secondary | ICD-10-CM | POA: Diagnosis not present

## 2018-01-13 DIAGNOSIS — E119 Type 2 diabetes mellitus without complications: Secondary | ICD-10-CM | POA: Diagnosis not present

## 2018-01-13 LAB — BAYER DCA HB A1C WAIVED: HB A1C: 8.7 % — AB (ref <7.0)

## 2018-01-13 MED ORDER — ONETOUCH ULTRA 2 W/DEVICE KIT: 1.0000 | PACK | Freq: Once | 0 refills | Status: DC

## 2018-01-13 MED ORDER — ONETOUCH DELICA LANCETS 33G MISC: 2 refills | Status: DC

## 2018-01-13 MED ORDER — GLUCOSE BLOOD VI STRP: ORAL_STRIP | 2 refills | Status: DC

## 2018-01-13 NOTE — Progress Notes (Signed)
Subjective:    Patient ID: Richard Nelson., male    DOB: 03-04-1951, 67 y.o.   MRN: [id number]  HPI Pt here for follow up and management of chronic medical problems which includes hyperlipidemia and diabetes. He is taking medication regularly.  The patient comes in today for his regular checkup.  Patient is questioning today the use of glyburide and having a sulfa allergy.  He is also complaining with low back pain.  He is due to get lab work today.  He weighs 246 pounds and this is 6 pounds more than he weighed at his last visit.  His body mass index is 34.4 at the last visit.  His vital signs are stable.  He has had his flu shot and will check with the insurance company regarding regarding the Prevnar vaccine.  The patient says with taking the glyburide that he senses a little irritation in his mouth at times.  There can be a cross reaction with people that are allergic to sulfa.  So we will go ahead and discontinue this.  We will wait until the remainder of his blood work is returned before we make further decisions regarding his diabetic care.  He denies any chest pain or shortness of breath.  He denies any trouble with swallowing heartburn indigestion nausea vomiting diarrhea or blood in the stool.  No change in bowel habits.  He is taking Zigduo 05 -1000 and only 1 tablet daily instead of 2 tablets daily.  We will not make any further changes in his treatment until the lab work that is done today is returned.  He does complain of some low back pain which sounds positional related and is worse when he is laying on his left side in the bed.  He also noticed a fluttering sensation one time with his heart and thought this might have been associated with caffeine intake.  C-spine films done on the patient in 2014 were reviewed with him today and it does show significant degenerative arthritis in the cervical spine.    Patient Active Problem List   Diagnosis Date Noted  . Erectile dysfunction due  to arterial insufficiency 08/06/2017  . Dyslipidemia 06/21/2017  . Essential hypertension 06/21/2017  . Anxiety states   . Depressive disorder, not elsewhere classified   . Hemorrhage of rectum and anus   . Polyp of colon   . Hyperplasia of prostate   . DM (diabetes mellitus) ([place])   . 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 Obesity 01/09/2011  . PRECORDIAL PAIN 01/09/2011  . HYPERLIPIDEMIA 01/07/2011  . TMJ PAIN 01/07/2011   Outpatient Encounter Medications as of 01/13/2018  Medication Sig  . aspirin 81 MG EC tablet Take 81 mg by mouth daily.    . enalapril (VASOTEC) 5 MG tablet TAKE 1 TABLET BY MOUTH EVERY DAY  . fluticasone (FLONASE) 50 MCG/ACT nasal spray Place 2 sprays into both nostrils daily.  Marland Kitchen glucose blood (ONE TOUCH ULTRA TEST) test strip CHECK BG TWICE DAILY  . glyBURIDE (DIABETA) 2.5 MG tablet TAKE 1 TABLET (2.5 MG TOTAL) BY MOUTH DAILY WITH BREAKFAST.  Marland Kitchen omega-3 acid ethyl esters (LOVAZA) 1 g capsule TAKE 2 CAPSULES (2 G TOTAL) BY MOUTH 2 (TWO) TIMES DAILY.  Marland Kitchen ONETOUCH DELICA LANCETS 02I MISC Test BS bid. Dx E11.9  . rosuvastatin (CRESTOR) 10 MG tablet TAKE 1 TABLET EVERY DAY  . Vitamin D, Ergocalciferol, (DRISDOL) 50000 units CAPS capsule TAKE 1 CAPSULE (50,000 UNITS TOTAL) BY MOUTH  EVERY 7 (SEVEN) DAYS.  Marland Kitchen XIGDUO XR 04-999 MG TB24 TAKE 2 TABLETS BY MOUTH EVERY DAY  . sildenafil (REVATIO) 20 MG tablet Take 2-5 tabs PRN prior to sexual activity (Patient not taking: Reported on 01/13/2018)   No facility-administered encounter medications on file as of 01/13/2018.       Review of Systems  Constitutional: Negative.   HENT: Negative.   Eyes: Negative.   Respiratory: Negative.   Cardiovascular: Negative.   Gastrointestinal: Negative.   Endocrine: Negative.   Genitourinary: Negative.   Musculoskeletal: Negative.        Low back - left side at night   Skin: Negative.   Allergic/Immunologic: Negative.   Neurological: Negative.   Hematological: Negative.     Psychiatric/Behavioral: Negative.        Objective:   Physical Exam  Constitutional: He is oriented to person, place, and time. He appears well-developed and well-nourished. No distress.  Patient is pleasant and alert.  HENT:  Head: Normocephalic and atraumatic.  Right Ear: External ear normal.  Left Ear: External ear normal.  Mouth/Throat: Oropharynx is clear and moist. No oropharyngeal exudate.  Slight nasal congestion bilaterally  Eyes: Conjunctivae and EOM are normal. Pupils are equal, round, and reactive to light. Right eye exhibits no discharge. Left eye exhibits no discharge. No scleral icterus.  Neck: Normal range of motion. Neck supple. No thyromegaly present.  No bruits thyromegaly or anterior cervical adenopathy  Cardiovascular: Normal rate, regular rhythm, normal heart sounds and intact distal pulses.  No murmur heard. Heart has a regular rate and rhythm at 84/min  Pulmonary/Chest: Effort normal and breath sounds normal. No respiratory distress. He has no wheezes. He has no rales. He exhibits no tenderness.  Clear anteriorly and posteriorly and no axillary adenopathy  Abdominal: Soft. Bowel sounds are normal. He exhibits no mass. There is no tenderness. There is no rebound and no guarding.  Abdominal obesity without masses tenderness or organ enlargement or bruits or inguinal adenopathy  Musculoskeletal: Normal range of motion. He exhibits no edema.  Lymphadenopathy:    He has no cervical adenopathy.  Neurological: He is alert and oriented to person, place, and time. He has normal reflexes. No cranial nerve deficit.  Skin: Skin is warm and dry. No rash noted.  Psychiatric: He has a normal mood and affect. His behavior is normal. Judgment and thought content normal.  Nursing note and vitals reviewed.    BP 133/81 (BP Location: Right Arm)   Pulse 91   Temp 98.2 F (36.8 C) (Oral)   Ht 5\' 10"  (1.778 m)   Wt 246 lb (111.6 kg)   BMI 35.30 kg/m        Assessment &  Plan:  1. Type 2 diabetes mellitus without complication, without long-term current use of insulin ([facility]) -Continue with current treatment and more aggressive therapeutic lifestyle changes with weight loss through diet and exercise  2. Testosterone deficiency -Treatment for this presently.  3. Pure hypercholesterolemia -Continue with Crestor and omega-3 fatty acids  4. Vitamin D deficiency -Continue with vitamin D replacement pending results of lab work  5. Essential hypertension, benign -Blood pressure is good today and he should continue with current treatment  6. Benign prostatic hyperplasia, unspecified whether lower urinary tract symptoms present -No complaints with voiding   7. Osteoarthritis of cervical spine, unspecified spinal osteoarthritis complication status -This x-ray report was reviewed with patient and most likely explains some of the discomfort he is having in his low back also.  He is not had any x-rays recently of the low back but he was told that if the problems with pain and position continue that we which should get x-rays of the low back at that time.  Patient Instructions  Continue current medications. Continue good therapeutic lifestyle changes which include good diet and exercise. Fall precautions discussed with patient. If an FOBT was given today- please return it to our front desk. If you are over 8 years old - you may need Prevnar 55 or the adult Pneumonia vaccine.  **Flu shots are available--- please call and schedule a FLU-CLINIC appointment**  After your visit with [place] today you will receive a survey in the mail or online from [employer] regarding your care with [place]. Please take a moment to fill this out. Your feedback is very important to [place] as you can help [place] better understand your patient needs as well as improve your experience and satisfaction. WE CARE ABOUT YOU!!!   Continue aggressive therapeutic lifestyle changes and lose weight instead of  gaining weight. Watch blood sugars closely Exercise and diet can help control blood sugar without taking additional medicines Discontinue glyburide since you are having a mouth irritation Drink plenty of water and avoid caffeine as much as possible Use nasal saline in each nostril regularly during the day and use Flonase once daily in each nostril Keep the house as cool as possible and use cool mist humidification  Arrie Senate MD

## 2018-01-13 NOTE — Patient Instructions (Addendum)
Continue current medications. Continue good therapeutic lifestyle changes which include good diet and exercise. Fall precautions discussed with patient. If an FOBT was given today- please return it to our front desk. If you are over 66 years old - you may need Prevnar 69 or the adult Pneumonia vaccine.  **Flu shots are available--- please call and schedule a FLU-CLINIC appointment**  After your visit with [place] today you will receive a survey in the mail or online from [employer] regarding your care with [place]. Please take a moment to fill this out. Your feedback is very important to [place] as you can help [place] better understand your patient needs as well as improve your experience and satisfaction. WE CARE ABOUT YOU!!!   Continue aggressive therapeutic lifestyle changes and lose weight instead of gaining weight. Watch blood sugars closely Exercise and diet can help control blood sugar without taking additional medicines Discontinue glyburide since you are having a mouth irritation Drink plenty of water and avoid caffeine as much as possible Use nasal saline in each nostril regularly during the day and use Flonase once daily in each nostril Keep the house as cool as possible and use cool mist humidification

## 2018-01-13 NOTE — Telephone Encounter (Signed)
Pt aware new glucose monitor, strips & lancets sent to pharmacy

## 2018-01-14 LAB — CBC WITH DIFFERENTIAL/PLATELET
BASOS: 0 %
Basophils Absolute: 0 10*3/uL (ref 0.0–0.2)
EOS (ABSOLUTE): 0.2 10*3/uL (ref 0.0–0.4)
Eos: 3 %
HEMATOCRIT: 44.9 % (ref 37.5–51.0)
HEMOGLOBIN: 16 g/dL (ref 13.0–17.7)
Immature Grans (Abs): 0 10*3/uL (ref 0.0–0.1)
Immature Granulocytes: 0 %
LYMPHS ABS: 2.5 10*3/uL (ref 0.7–3.1)
Lymphs: 30 %
MCH: 27.2 pg (ref 26.6–33.0)
MCHC: 35.6 g/dL (ref 31.5–35.7)
MCV: 76 fL — AB (ref 79–97)
MONOCYTES: 7 %
MONOS ABS: 0.6 10*3/uL (ref 0.1–0.9)
NEUTROS ABS: 5.1 10*3/uL (ref 1.4–7.0)
Neutrophils: 60 %
Platelets: 218 10*3/uL (ref 150–379)
RBC: 5.89 x10E6/uL — ABNORMAL HIGH (ref 4.14–5.80)
RDW: 13.2 % (ref 12.3–15.4)
WBC: 8.4 10*3/uL (ref 3.4–10.8)

## 2018-01-14 LAB — HEPATIC FUNCTION PANEL
ALBUMIN: 4.8 g/dL (ref 3.6–4.8)
ALT: 29 IU/L (ref 0–44)
AST: 19 IU/L (ref 0–40)
Alkaline Phosphatase: 80 IU/L (ref 39–117)
Bilirubin Total: 0.4 mg/dL (ref 0.0–1.2)
Bilirubin, Direct: 0.13 mg/dL (ref 0.00–0.40)
TOTAL PROTEIN: 7 g/dL (ref 6.0–8.5)

## 2018-01-14 LAB — BMP8+EGFR
BUN / CREAT RATIO: 12 (ref 10–24)
BUN: 9 mg/dL (ref 8–27)
CO2: 22 mmol/L (ref 20–29)
CREATININE: 0.73 mg/dL — AB (ref 0.76–1.27)
Calcium: 9.6 mg/dL (ref 8.6–10.2)
Chloride: 103 mmol/L (ref 96–106)
GFR calc non Af Amer: 97 mL/min/{1.73_m2} (ref >59)
GFR, EST AFRICAN AMERICAN: 113 mL/min/{1.73_m2} (ref >59)
Glucose: 109 mg/dL — ABNORMAL HIGH (ref 65–99)
Potassium: 4.1 mmol/L (ref 3.5–5.2)
SODIUM: 141 mmol/L (ref 134–144)

## 2018-01-14 LAB — LIPID PANEL
CHOL/HDL RATIO: 3.1 ratio (ref 0.0–5.0)
Cholesterol, Total: 128 mg/dL (ref 100–199)
HDL: 41 mg/dL (ref >39)
LDL CALC: 57 mg/dL (ref 0–99)
TRIGLYCERIDES: 150 mg/dL — AB (ref 0–149)
VLDL Cholesterol Cal: 30 mg/dL (ref 5–40)

## 2018-01-14 LAB — VITAMIN D 25 HYDROXY (VIT D DEFICIENCY, FRACTURES): VIT D 25 HYDROXY: 40.5 ng/mL (ref 30.0–100.0)

## 2018-01-15 ENCOUNTER — Other Ambulatory Visit: Payer: Self-pay | Admitting: Family Medicine

## 2018-01-16 ENCOUNTER — Other Ambulatory Visit: Payer: Self-pay | Admitting: *Deleted

## 2018-01-16 MED ORDER — SITAGLIPTIN PHOSPHATE 50 MG PO TABS: 50.0000 mg | ORAL_TABLET | Freq: Every day | ORAL | 1 refills | Status: DC

## 2018-01-20 ENCOUNTER — Other Ambulatory Visit: Payer: Self-pay | Admitting: *Deleted

## 2018-01-20 MED ORDER — ROSUVASTATIN CALCIUM 10 MG PO TABS: 10.0000 mg | ORAL_TABLET | Freq: Every day | ORAL | 1 refills | Status: DC

## 2018-01-20 MED ORDER — ENALAPRIL MALEATE 5 MG PO TABS: 5.0000 mg | ORAL_TABLET | Freq: Every day | ORAL | 3 refills | Status: DC

## 2018-01-21 ENCOUNTER — Other Ambulatory Visit: Payer: Self-pay

## 2018-01-21 MED ORDER — GLYBURIDE 2.5 MG PO TABS: 2.5000 mg | ORAL_TABLET | Freq: Every day | ORAL | 0 refills | Status: DC

## 2018-02-03 ENCOUNTER — Other Ambulatory Visit: Payer: Self-pay

## 2018-02-03 MED ORDER — VITAMIN D (ERGOCALCIFEROL) 1.25 MG (50000 UNIT) PO CAPS: ORAL_CAPSULE | ORAL | 1 refills | Status: DC

## 2018-02-11 ENCOUNTER — Other Ambulatory Visit: Payer: Self-pay | Admitting: Family Medicine

## 2018-04-06 ENCOUNTER — Other Ambulatory Visit: Payer: Self-pay | Admitting: Family Medicine

## 2018-04-12 ENCOUNTER — Other Ambulatory Visit: Payer: Self-pay | Admitting: Family Medicine

## 2018-04-13 ENCOUNTER — Telehealth: Payer: Self-pay | Admitting: *Deleted

## 2018-04-13 NOTE — Telephone Encounter (Signed)
Change to atorvastatin 20 mg and make sure that liver function tests are repeated in 4 to 6 weeks

## 2018-04-13 NOTE — Telephone Encounter (Signed)
Fax received [facility] [place] paying $48 for Rosuvastatin 10 mg Requesting lower out of pocket Simvastatin 40 mg and Atorvastatin 20 mg$14 Please advise

## 2018-04-13 NOTE — Telephone Encounter (Signed)
OV 05/13/18

## 2018-04-14 MED ORDER — ATORVASTATIN CALCIUM 20 MG PO TABS: 20.0000 mg | ORAL_TABLET | Freq: Every day | ORAL | 3 refills | Status: DC

## 2018-04-15 NOTE — Telephone Encounter (Signed)
Med was sent in yesterday

## 2018-04-28 ENCOUNTER — Other Ambulatory Visit: Payer: Self-pay | Admitting: Family Medicine

## 2018-05-08 ENCOUNTER — Encounter: Payer: Self-pay | Admitting: Family Medicine

## 2018-05-08 ENCOUNTER — Encounter: Payer: Self-pay | Admitting: Gastroenterology

## 2018-05-13 ENCOUNTER — Ambulatory Visit (INDEPENDENT_AMBULATORY_CARE_PROVIDER_SITE_OTHER): Payer: BLUE CROSS/BLUE SHIELD

## 2018-05-13 ENCOUNTER — Ambulatory Visit (INDEPENDENT_AMBULATORY_CARE_PROVIDER_SITE_OTHER): Payer: BLUE CROSS/BLUE SHIELD | Admitting: Family Medicine

## 2018-05-13 ENCOUNTER — Encounter: Payer: Self-pay | Admitting: Family Medicine

## 2018-05-13 VITALS — BP 135/79 | HR 101 | Temp 99.4°F | Ht 70.0 in | Wt 251.0 lb

## 2018-05-13 DIAGNOSIS — E7800 Pure hypercholesterolemia, unspecified: Secondary | ICD-10-CM | POA: Diagnosis not present

## 2018-05-13 DIAGNOSIS — E119 Type 2 diabetes mellitus without complications: Secondary | ICD-10-CM

## 2018-05-13 DIAGNOSIS — Z0000 Encounter for general adult medical examination without abnormal findings: Secondary | ICD-10-CM

## 2018-05-13 DIAGNOSIS — N400 Enlarged prostate without lower urinary tract symptoms: Secondary | ICD-10-CM

## 2018-05-13 DIAGNOSIS — I10 Essential (primary) hypertension: Secondary | ICD-10-CM

## 2018-05-13 DIAGNOSIS — E349 Endocrine disorder, unspecified: Secondary | ICD-10-CM

## 2018-05-13 DIAGNOSIS — W57XXXA Bitten or stung by nonvenomous insect and other nonvenomous arthropods, initial encounter: Secondary | ICD-10-CM

## 2018-05-13 DIAGNOSIS — E559 Vitamin D deficiency, unspecified: Secondary | ICD-10-CM

## 2018-05-13 LAB — URINALYSIS, COMPLETE
Bilirubin, UA: NEGATIVE
KETONES UA: NEGATIVE
LEUKOCYTES UA: NEGATIVE
Nitrite, UA: NEGATIVE
PROTEIN UA: NEGATIVE
SPEC GRAV UA: 1.015 (ref 1.005–1.030)
Urobilinogen, Ur: 0.2 mg/dL (ref 0.2–1.0)
pH, UA: 5 (ref 5.0–7.5)

## 2018-05-13 LAB — MICROSCOPIC EXAMINATION
Bacteria, UA: NONE SEEN
Epithelial Cells (non renal): NONE SEEN /hpf (ref 0–10)
Renal Epithel, UA: NONE SEEN /hpf
WBC, UA: NONE SEEN /hpf (ref 0–5)

## 2018-05-13 NOTE — Patient Instructions (Addendum)
Medicare Annual Wellness Visit  [facility] and the medical providers at [facility] strive to bring you the best medical care.  In doing so we not only want to address your current medical conditions and concerns but also to detect new conditions early and prevent illness, disease and health-related problems.    Medicare offers a yearly Wellness Visit which allows our clinical staff to assess your need for preventative services including immunizations, lifestyle education, counseling to decrease risk of preventable diseases and screening for fall risk and other medical concerns.    This visit is provided free of charge (no copay) for all Medicare recipients. The clinical pharmacists at [facility] have begun to conduct these Wellness Visits which will also include a thorough review of all your medications.    As you primary medical provider recommend that you make an appointment for your Annual Wellness Visit if you have not done so already this year.  You may set up this appointment before you leave today or you may call back ([phone]) and schedule an appointment.  Please make sure when you call that you mention that you are scheduling your Annual Wellness Visit with the clinical pharmacist so that the appointment may be made for the proper length of time.    Continue current medications. Continue good therapeutic lifestyle changes which include good diet and exercise. Fall precautions discussed with patient. If an FOBT was given today- please return it to our front desk. If you are over 57 years old - you may need Prevnar 45 or the adult Pneumonia vaccine.  **Flu shots are available--- please call and schedule a FLU-CLINIC appointment**  After your visit with [place] today you will receive a survey in the mail or online from [employer] regarding your care with [place]. Please take a moment to fill this out. Your feedback is very  important to [place] as you can help [place] better understand your patient needs as well as improve your experience and satisfaction. WE CARE ABOUT YOU!!!   We will call with your test results and x-ray results as soon as those results become available Please return to the office for your fasting blood work on which we will also order test for [facility] spotted fever and Lyme disease Try to do better with tick bite prevention by using deep [place] off and covering the skin more appropriately before going out in the woods  Please call and set up an eye exam visit with [facility] eye physicians in [place] We have frequently used Dr. Renford Dills and Josie Dixon It is very important that you get an eye exam yearly, please do this.

## 2018-05-13 NOTE — Progress Notes (Signed)
Subjective:    Patient ID: Richard Nelson., male    DOB: [birth year], 67 y.o.   MRN: [id number]  HPI Pt here for CPE and  follow up of chronic medical problems which includes diabetes, hypertension and hyperlipidemia. He is taking medication regularly.  Patient does complain today of some discomfort in the left chest at times and this seems to be worse when he is been doing caffeine.  His initial blood pressure was high but the repeat of 135/79.  He is diabetic and he has a strong family history of heart disease on his father's side.  Patient recently saw the cardiologist as of last year and had a normal stress test.  He has not had an eye exam in 3 to 4 years and I have given him the name of the group in [place] that he can call and set up an appointment with to have an eye exam and reinforced to him that he needs to have this done yearly.  He says his home blood sugars run between 120 270.  I told him since he has this twitching sensation in the left chest that he needed to reduce his caffeine intake and he says he will try to do that.  He denies any chest pain.  He denies any shortness of breath.  He does have some occasional trouble with swallowing but not that often.  He denies any heartburn indigestion blood in the stool or black tarry bowel movements.  He indicates that the gastroenterologist has called him for him to come in and get a 5-year repeat colonoscopy and that would be sometime in July.  He is passing his water without problems.  Patient also describes a recent tick bite to his left forearm.    Patient Active Problem List   Diagnosis Date Noted  . Erectile dysfunction due to arterial insufficiency 08/06/2017  . Dyslipidemia 06/21/2017  . Essential hypertension 06/21/2017  . Anxiety states   . Depressive disorder, not elsewhere classified   . Hemorrhage of rectum and anus   . Polyp of colon   . Hyperplasia of prostate   . DM (diabetes mellitus) ([place])   . Decreased free  testosterone level in male   . Obesity 01/09/2011  . PRECORDIAL PAIN 01/09/2011  . HYPERLIPIDEMIA 01/07/2011  . TMJ PAIN 01/07/2011   Outpatient Encounter Medications as of 05/13/2018  Medication Sig  . aspirin 81 MG EC tablet Take 81 mg by mouth daily.    . Blood Glucose Monitoring Suppl (ONE TOUCH ULTRA 2) w/Device KIT Use to test blood sugar twice daily  . enalapril (VASOTEC) 5 MG tablet Take 1 tablet (5 mg total) by mouth daily.  . fluticasone (FLONASE) 50 MCG/ACT nasal spray SPRAY 2 SPRAYS INTO EACH NOSTRIL EVERY DAY  . glucose blood (ONE TOUCH ULTRA TEST) test strip CHECK BG TWICE DAILY  . glyBURIDE (DIABETA) 2.5 MG tablet TAKE 1 TABLET (2.5 MG TOTAL) BY MOUTH DAILY WITH BREAKFAST.  Marland Kitchen omega-3 acid ethyl esters (LOVAZA) 1 g capsule TAKE 2 CAPSULES (2 G TOTAL) BY MOUTH 2 (TWO) TIMES DAILY.  Marland Kitchen ONETOUCH DELICA LANCETS 66A MISC Test BS bid  . rosuvastatin (CRESTOR) 10 MG tablet Take 1 tablet (10 mg total) by mouth daily.  . sildenafil (REVATIO) 20 MG tablet Take 2-5 tabs PRN prior to sexual activity  . sitaGLIPtin (JANUVIA) 50 MG tablet Take 1 tablet (50 mg total) by mouth daily.  . Vitamin D, Ergocalciferol, (DRISDOL) 50000 units CAPS capsule TAKE 1  CAPSULE (50,000 UNITS TOTAL) BY MOUTH EVERY 7 (SEVEN) DAYS.  Marland Kitchen XIGDUO XR 04-999 MG TB24 TAKE 2 TABLETS BY MOUTH EVERY DAY  . atorvastatin (LIPITOR) 20 MG tablet Take 1 tablet (20 mg total) by mouth daily. (Patient not taking: Reported on 05/13/2018)   No facility-administered encounter medications on file as of 05/13/2018.      Review of Systems  Constitutional: Negative.   HENT: Negative.   Eyes: Negative.   Respiratory: Negative.   Cardiovascular: Negative.   Gastrointestinal: Negative.   Endocrine: Negative.   Genitourinary: Negative.   Musculoskeletal: Negative.   Skin: Negative.   Allergic/Immunologic: Negative.   Neurological: Negative.   Hematological: Negative.   Psychiatric/Behavioral: Negative.        Objective:    Physical Exam  Constitutional: He is oriented to person, place, and time. He appears well-developed and well-nourished. No distress.  Patient is pleasant and alert and technology savvy  HENT:  Head: Normocephalic and atraumatic.  Right Ear: External ear normal.  Left Ear: External ear normal.  Nose: Nose normal.  Mouth/Throat: Oropharynx is clear and moist. No oropharyngeal exudate.  Eyes: Pupils are equal, round, and reactive to light. Conjunctivae and EOM are normal. Right eye exhibits no discharge. Left eye exhibits no discharge. No scleral icterus.  Neck: Normal range of motion. Neck supple. No thyromegaly present.  No bruits thyromegaly or anterior cervical adenopathy  Cardiovascular: Normal rate, regular rhythm, normal heart sounds and intact distal pulses.  No murmur heard. Heart is regular at 96/min  Pulmonary/Chest: Effort normal and breath sounds normal. He has no wheezes. He has no rales. He exhibits no tenderness.  Clear anteriorly and posteriorly and no axillary adenopathy or chest wall masses.  Abdominal: Soft. Bowel sounds are normal. He exhibits no mass. There is no tenderness.  Abdominal obesity without masses tenderness organ enlargement or bruits and no inguinal adenopathy  Genitourinary: Rectum normal and penis normal.  Genitourinary Comments: The prostate is enlarged but soft and without masses or irregularities.  There is no rectal masses.  The external genitalia were within normal limits and no inguinal hernias were palpable.  Musculoskeletal: Normal range of motion. He exhibits no edema.  Lymphadenopathy:    He has no cervical adenopathy.  Neurological: He is alert and oriented to person, place, and time. He has normal reflexes. No cranial nerve deficit.  Skin: Skin is warm and dry. No rash noted. There is erythema.  A couple of bite areas one on the back with surrounding redness and one that appears to be healing on the left forearm that was definitely a tick bite  according to the patient.  Psychiatric: He has a normal mood and affect. His behavior is normal. Judgment and thought content normal.  The patient's mood and affect were positive  Nursing note and vitals reviewed.   BP (!) 145/80 (BP Location: Left Arm)   Pulse (!) 101   Temp 99.4 F (37.4 C) (Oral)   Ht '5\' 10"'  (1.778 m)   Wt 251 lb (113.9 kg)   BMI 36.01 kg/m        Assessment & Plan:  1. Annual physical exam -The patient will return to the office for his fasting blood work. - BMP8+EGFR; Future - CBC with Differential/Platelet; Future - Lipid panel; Future - PSA, total and free; Future - VITAMIN D 25 Hydroxy (Vit-D Deficiency, Fractures); Future - Hepatic function panel; Future - Urinalysis, Complete - DG Chest 2 View; Future  2. Type 2 diabetes mellitus without  complication, without long-term current use of insulin ([place]) -He says that his blood sugars at home run between 140 and 170.  He will stay on his current medicine pending results of lab work - BMP8+EGFR; Future - CBC with Differential/Platelet; Future - Bayer DCA Hb A1c Waived; Future  3. Testosterone deficiency -He is currently not doing testosterone replacement. - CBC with Differential/Platelet; Future - PSA, total and free; Future - Urinalysis, Complete  4. Pure hypercholesterolemia -Continue with aggressive therapeutic lifestyle changes and Crestor.  He will also continue with his omega-3 fatty acids and when his lab work is returned we will discuss switching him from his current omega-3 to [place] if insurance will pay for this. - CBC with Differential/Platelet; Future - Lipid panel; Future - DG Chest 2 View; Future  5. Vitamin D deficiency -Continue vitamin D replacement pending results of lab work - CBC with Differential/Platelet; Future - VITAMIN D 25 Hydroxy (Vit-D Deficiency, Fractures); Future  6. Essential hypertension, benign -Patient will continue with current treatment and will try to do  better with weight loss and sodium restriction - CBC with Differential/Platelet; Future - Hepatic function panel; Future - DG Chest 2 View; Future  7. Tick bite, initial encounter - [place] spotted fvr abs pnl(IgG+IgM) - Lyme Ab/Western Blot Reflex  8. Benign prostatic hyperplasia, unspecified whether lower urinary tract symptoms present -Check PSA  9. Morbid obesity ([place]) -The patient has morbid obesity based upon the fact that he has 2 or more comorbid medical conditions in conjunction with a BMI greater than 35.  It was discussed very importantly with him that he needs to work on weight loss through diet and exercise.  Patient Instructions                       Medicare Annual Wellness Visit  [facility] and the medical providers at [facility] strive to bring you the best medical care.  In doing so we not only want to address your current medical conditions and concerns but also to detect new conditions early and prevent illness, disease and health-related problems.    Medicare offers a yearly Wellness Visit which allows our clinical staff to assess your need for preventative services including immunizations, lifestyle education, counseling to decrease risk of preventable diseases and screening for fall risk and other medical concerns.    This visit is provided free of charge (no copay) for all Medicare recipients. The clinical pharmacists at [facility] have begun to conduct these Wellness Visits which will also include a thorough review of all your medications.    As you primary medical provider recommend that you make an appointment for your Annual Wellness Visit if you have not done so already this year.  You may set up this appointment before you leave today or you may call back ([phone]) and schedule an appointment.  Please make sure when you call that you mention that you are scheduling your Annual Wellness Visit with the clinical  pharmacist so that the appointment may be made for the proper length of time.    Continue current medications. Continue good therapeutic lifestyle changes which include good diet and exercise. Fall precautions discussed with patient. If an FOBT was given today- please return it to our front desk. If you are over 18 years old - you may need Prevnar 7 or the adult Pneumonia vaccine.  **Flu shots are available--- please call and schedule a FLU-CLINIC appointment**  After  your visit with [place] today you will receive a survey in the mail or online from [employer] regarding your care with [place]. Please take a moment to fill this out. Your feedback is very important to [place] as you can help [place] better understand your patient needs as well as improve your experience and satisfaction. WE CARE ABOUT YOU!!!   We will call with your test results and x-ray results as soon as those results become available Please return to the office for your fasting blood work on which we will also order test for [facility] spotted fever and Lyme disease Try to do better with tick bite prevention by using deep [place] off and covering the skin more appropriately before going out in the woods  Please call and set up an eye exam visit with [facility] eye physicians in [place] We have frequently used Dr. Renford Dills and Josie Dixon It is very important that you get an eye exam yearly, please do this.  Arrie Senate MD

## 2018-05-15 ENCOUNTER — Other Ambulatory Visit: Payer: Self-pay | Admitting: Family Medicine

## 2018-05-15 ENCOUNTER — Telehealth: Payer: Self-pay | Admitting: Family Medicine

## 2018-05-15 NOTE — Telephone Encounter (Signed)
Pt notified to continue current meds Verbalizes understanding

## 2018-05-22 ENCOUNTER — Telehealth: Payer: Self-pay | Admitting: Family Medicine

## 2018-05-22 NOTE — Telephone Encounter (Signed)
FYI, I called her but she wanted to talk with you specifically and would not tell me anything.  She said she is going to be in and out today, so she will call you back sometime next week.

## 2018-06-01 ENCOUNTER — Other Ambulatory Visit: Payer: BLUE CROSS/BLUE SHIELD

## 2018-06-01 DIAGNOSIS — I10 Essential (primary) hypertension: Secondary | ICD-10-CM | POA: Diagnosis not present

## 2018-06-01 DIAGNOSIS — Z0000 Encounter for general adult medical examination without abnormal findings: Secondary | ICD-10-CM

## 2018-06-01 DIAGNOSIS — E119 Type 2 diabetes mellitus without complications: Secondary | ICD-10-CM | POA: Diagnosis not present

## 2018-06-01 DIAGNOSIS — E7800 Pure hypercholesterolemia, unspecified: Secondary | ICD-10-CM | POA: Diagnosis not present

## 2018-06-01 DIAGNOSIS — E349 Endocrine disorder, unspecified: Secondary | ICD-10-CM

## 2018-06-01 DIAGNOSIS — E559 Vitamin D deficiency, unspecified: Secondary | ICD-10-CM | POA: Diagnosis not present

## 2018-06-01 LAB — BAYER DCA HB A1C WAIVED: HB A1C (BAYER DCA - WAIVED): 8.9 % — ABNORMAL HIGH (ref <7.0)

## 2018-06-02 LAB — PSA, TOTAL AND FREE
PROSTATE SPECIFIC AG, SERUM: 0.6 ng/mL (ref 0.0–4.0)
PSA FREE: 0.17 ng/mL
PSA, Free Pct: 28.3 %

## 2018-06-02 LAB — CBC WITH DIFFERENTIAL/PLATELET
BASOS ABS: 0 10*3/uL (ref 0.0–0.2)
BASOS: 0 %
EOS (ABSOLUTE): 0.2 10*3/uL (ref 0.0–0.4)
Eos: 3 %
Hematocrit: 45 % (ref 37.5–51.0)
Hemoglobin: 15.3 g/dL (ref 13.0–17.7)
IMMATURE GRANS (ABS): 0 10*3/uL (ref 0.0–0.1)
IMMATURE GRANULOCYTES: 0 %
LYMPHS: 27 %
Lymphocytes Absolute: 1.4 10*3/uL (ref 0.7–3.1)
MCH: 27.6 pg (ref 26.6–33.0)
MCHC: 34 g/dL (ref 31.5–35.7)
MCV: 81 fL (ref 79–97)
Monocytes Absolute: 0.4 10*3/uL (ref 0.1–0.9)
Monocytes: 8 %
NEUTROS PCT: 62 %
Neutrophils Absolute: 3.2 10*3/uL (ref 1.4–7.0)
PLATELETS: 189 10*3/uL (ref 150–450)
RBC: 5.55 x10E6/uL (ref 4.14–5.80)
RDW: 13.2 % (ref 12.3–15.4)
WBC: 5.1 10*3/uL (ref 3.4–10.8)

## 2018-06-02 LAB — HEPATIC FUNCTION PANEL
ALBUMIN: 4.5 g/dL (ref 3.6–4.8)
ALT: 45 IU/L — ABNORMAL HIGH (ref 0–44)
AST: 22 IU/L (ref 0–40)
Alkaline Phosphatase: 90 IU/L (ref 39–117)
Bilirubin Total: 0.4 mg/dL (ref 0.0–1.2)
Bilirubin, Direct: 0.12 mg/dL (ref 0.00–0.40)
Total Protein: 6.2 g/dL (ref 6.0–8.5)

## 2018-06-02 LAB — BMP8+EGFR
BUN / CREAT RATIO: 13 (ref 10–24)
BUN: 10 mg/dL (ref 8–27)
CALCIUM: 9.4 mg/dL (ref 8.6–10.2)
CHLORIDE: 103 mmol/L (ref 96–106)
CO2: 22 mmol/L (ref 20–29)
Creatinine, Ser: 0.8 mg/dL (ref 0.76–1.27)
GFR calc non Af Amer: 94 mL/min/{1.73_m2} (ref >59)
GFR, EST AFRICAN AMERICAN: 108 mL/min/{1.73_m2} (ref >59)
Glucose: 196 mg/dL — ABNORMAL HIGH (ref 65–99)
POTASSIUM: 4.3 mmol/L (ref 3.5–5.2)
Sodium: 141 mmol/L (ref 134–144)

## 2018-06-02 LAB — LIPID PANEL
CHOL/HDL RATIO: 4 ratio (ref 0.0–5.0)
Cholesterol, Total: 143 mg/dL (ref 100–199)
HDL: 36 mg/dL — ABNORMAL LOW (ref >39)
LDL CALC: 79 mg/dL (ref 0–99)
TRIGLYCERIDES: 140 mg/dL (ref 0–149)
VLDL Cholesterol Cal: 28 mg/dL (ref 5–40)

## 2018-06-02 LAB — VITAMIN D 25 HYDROXY (VIT D DEFICIENCY, FRACTURES): Vit D, 25-Hydroxy: 39 ng/mL (ref 30.0–100.0)

## 2018-06-26 ENCOUNTER — Encounter: Payer: Self-pay | Admitting: Pharmacist Clinician (PhC)/ Clinical Pharmacy Specialist

## 2018-06-26 ENCOUNTER — Ambulatory Visit (INDEPENDENT_AMBULATORY_CARE_PROVIDER_SITE_OTHER): Payer: BLUE CROSS/BLUE SHIELD | Admitting: Pharmacist Clinician (PhC)/ Clinical Pharmacy Specialist

## 2018-06-26 DIAGNOSIS — E119 Type 2 diabetes mellitus without complications: Secondary | ICD-10-CM | POA: Diagnosis not present

## 2018-06-26 MED ORDER — VENLAFAXINE HCL ER 37.5 MG PO CP24: 37.5000 mg | ORAL_CAPSULE | Freq: Every day | ORAL | 1 refills | Status: DC

## 2018-06-26 NOTE — Progress Notes (Addendum)
Mr. Detjen comes in today for diabetes follow up and education.  He is taking three oral agents for treatment of diabetes with A1C still not at goal has been 8.7->9% for last two years.  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 Current diet is extremely poor due to family stress of wife having pulmonary hypertension and two elderly patients age 66 who he is responsible to care for.  A lot of pressure is on him. He states that everyone expects him to care for them, but no one cares for him.   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  Current diet:  Breakfast:  Pop tarts, pack of nabs, granola bars, bananas- high CHO foods  Lunch:  balogna sandwich on white bread, fruit cups, chips diet soda (mt dew)  Snacks:  Very little snacking, chocolate  Dinner:  From a restaurant take out or pizza.  [place].  Does not eat leftovers so portion size is an issue.   Exercise:  [place] at the beach  Patient takes medications and is adherent  Weight:  Has gone from 270-240 and is now 245lbs.  Has abdominal obesity   A/P:  1.  Type 2 diabetes:  Not optimally controlled.  A1C goal is 7%.  He is taking 4 different classes of diabetes medications.  Diet is poor and no routine exercise.  2.  Discussed Victoza and Byetta with patient and potential benefits however, he does not want to add more medications to his current regimen. This GLP-1 are the best option for this patient.  He will have to stop his DPP-4 if he goes on a GLP-1.  3.  Diet must change to get control of diabetes and exercise must also be part of disease control.  Counseled patient on diabetes diet and CHOs and changes he needs to make if he doesn't want to add more medications.  4.  Patient states that he would like to try an antidepressant to help him deal with his level of daily stressors that are adding up and causing him to feel depressed.  I also suggested CBT counseling.  He is not suicidal nor does he exhibit  any traits that would be concerning base on PHQ9.score.  I will try him on low dose effexor 37.5mg  qam.  Patient is to call with feedback on blood glucose and effexor in 1-2 weeks.  Follow up A1C and if not at goal would start Victoza or Byetta.  Total time with patient 45 minutes  Memory Argue, PharmD. CPP, FNLA

## 2018-06-26 NOTE — Addendum Note (Signed)
Addended by: Memory Argue on: 06/26/2018 01:22 PM   Modules accepted: Orders

## 2018-06-26 NOTE — Addendum Note (Signed)
Addended by: Marylin Crosby on: 06/26/2018 04:56 PM   Modules accepted: Orders

## 2018-06-28 ENCOUNTER — Encounter: Payer: Self-pay | Admitting: Family Medicine

## 2018-06-28 LAB — FECAL OCCULT BLOOD, IMMUNOCHEMICAL: FECAL OCCULT BLD: NEGATIVE

## 2018-06-29 ENCOUNTER — Other Ambulatory Visit: Payer: Self-pay | Admitting: Family Medicine

## 2018-06-30 ENCOUNTER — Ambulatory Visit (INDEPENDENT_AMBULATORY_CARE_PROVIDER_SITE_OTHER): Payer: BLUE CROSS/BLUE SHIELD | Admitting: Family Medicine

## 2018-06-30 ENCOUNTER — Encounter: Payer: Self-pay | Admitting: Family Medicine

## 2018-06-30 VITALS — BP 138/79 | HR 87 | Temp 97.9°F | Ht 70.0 in | Wt 249.0 lb

## 2018-06-30 DIAGNOSIS — W57XXXA Bitten or stung by nonvenomous insect and other nonvenomous arthropods, initial encounter: Secondary | ICD-10-CM

## 2018-06-30 DIAGNOSIS — L03313 Cellulitis of chest wall: Secondary | ICD-10-CM | POA: Diagnosis not present

## 2018-06-30 MED ORDER — DOXYCYCLINE HYCLATE 100 MG PO TABS: 100.0000 mg | ORAL_TABLET | Freq: Two times a day (BID) | ORAL | 0 refills | Status: DC

## 2018-06-30 NOTE — Progress Notes (Signed)
   HPI  Patient presents today with tick bites and a red area on the left flank.  Patient describes 3 recent tick bites, the most recent on his left upper back that was present for 24 to 48 hours. He denies any fever, chills, sweats. He states there is very itchy.  He has another area on his left upper side in the mid axillary line that he thinks is a spider bite.  It is longer red and does not seem to be changing.  This happened about 4 days ago.  Is tolerating food and fluids like usual  PMH: Smoking status noted ROS: Per HPI  Objective: BP 138/79   Pulse 87   Temp 97.9 F (36.6 C) (Oral)   Ht 5\' 10"  (1.778 m)   Wt 249 lb (112.9 kg)   BMI 35.73 kg/m  Gen: NAD, alert, cooperative with exam HEENT: NCAT CV: RRR, good S1/S2, no murmur Resp: CTABL, no wheezes, non-labored Ext: No edema, warm Neuro: Alert and oriented, No gross deficits Skin:  Left upper back with approximately 3 cm area of induration and central punctate lesion consistent with tick bite Left flank in the mid axillary line patient has 1.5 cm x 11.5 cm area of erythema  Assessment and plan:  #Cellulitis of the chest wall Left midaxillary line Doxycycline Possible spider bite, however I think it is wisest to go ahead and cover for infection  #Tick bite Left upper back, also to other recent tick bites Doxycycline as above, no targetoid lesion   Meds ordered this encounter  Medications  . doxycycline (VIBRA-TABS) 100 MG tablet    Sig: Take 1 tablet (100 mg total) by mouth 2 (two) times daily. 1 po bid    Dispense:  20 tablet    Refill:  0    Laroy Apple, MD [facility] 06/30/2018, 3:55 PM

## 2018-06-30 NOTE — Patient Instructions (Signed)
Great to see you!  Call or come back with any concerns

## 2018-07-12 ENCOUNTER — Other Ambulatory Visit: Payer: Self-pay | Admitting: Family Medicine

## 2018-07-15 ENCOUNTER — Other Ambulatory Visit: Payer: Self-pay | Admitting: Family Medicine

## 2018-07-20 ENCOUNTER — Other Ambulatory Visit: Payer: Self-pay | Admitting: Family Medicine

## 2018-07-20 MED ORDER — GLUCOSE BLOOD VI STRP: ORAL_STRIP | 0 refills | Status: DC

## 2018-07-21 ENCOUNTER — Other Ambulatory Visit: Payer: Self-pay | Admitting: Family Medicine

## 2018-08-25 ENCOUNTER — Other Ambulatory Visit: Payer: Self-pay | Admitting: *Deleted

## 2018-08-25 MED ORDER — VENLAFAXINE HCL ER 37.5 MG PO CP24: 37.5000 mg | ORAL_CAPSULE | Freq: Every day | ORAL | 0 refills | Status: DC

## 2018-09-16 ENCOUNTER — Ambulatory Visit (INDEPENDENT_AMBULATORY_CARE_PROVIDER_SITE_OTHER): Payer: BLUE CROSS/BLUE SHIELD | Admitting: Family Medicine

## 2018-09-16 ENCOUNTER — Encounter: Payer: Self-pay | Admitting: Family Medicine

## 2018-09-16 VITALS — BP 138/74 | HR 82 | Temp 97.9°F | Ht 70.0 in | Wt 250.0 lb

## 2018-09-16 DIAGNOSIS — E7800 Pure hypercholesterolemia, unspecified: Secondary | ICD-10-CM | POA: Diagnosis not present

## 2018-09-16 DIAGNOSIS — I10 Essential (primary) hypertension: Secondary | ICD-10-CM

## 2018-09-16 DIAGNOSIS — R10814 Left lower quadrant abdominal tenderness: Secondary | ICD-10-CM

## 2018-09-16 DIAGNOSIS — E119 Type 2 diabetes mellitus without complications: Secondary | ICD-10-CM | POA: Diagnosis not present

## 2018-09-16 DIAGNOSIS — E559 Vitamin D deficiency, unspecified: Secondary | ICD-10-CM

## 2018-09-16 DIAGNOSIS — E349 Endocrine disorder, unspecified: Secondary | ICD-10-CM

## 2018-09-16 DIAGNOSIS — S86812A Strain of other muscle(s) and tendon(s) at lower leg level, left leg, initial encounter: Secondary | ICD-10-CM

## 2018-09-16 NOTE — Progress Notes (Signed)
Subjective:    Patient ID: Richard Nelson., male    DOB: 1951/09/13, 67 y.o.   MRN: [id number]  HPI  Pt here for follow up and management of chronic medical problems which includes diabetes and hypertension. He is taking medication regularly.  This patient today does complain of some low abdominal pain and weakness in his left knee.  He will return to the office for fasting blood work as he is not fasting today.  He does not need any refills.  His vital signs are stable and his weight is up 1 pound.  His body mass index is 35.73 which puts him in the morbid obesity category.  The patient denies any chest pain pressure tightness or shortness of breath.  He denies any trouble with his stomach including nausea vomiting diarrhea blood in the stool black tarry bowel movements or change in bowel habits.  He is passing his water without problems.  He mentions that he extended his left knee recently and he has had trouble with his knee and surgery on the knee and has to get rid of scar tissue.  When he overextended the knee this is when his knee hurts it does not hurt now just with walking but if he overextends it again he can feel the pain.  He also describes the low abdominal pain in the left side without any bulging on the abdominal wall.  With no change in bowel habits is most likely a muscle strain of some sort.  He says that his blood sugars at home have been running in the 1 30-1 50 range and sometimes lower when he is fasting.  His vital signs are stable.  He is still past due on getting an eye exam and we have reminded him of this on multiple occasions.    Patient Active Problem List   Diagnosis Date Noted  . Erectile dysfunction due to arterial insufficiency 08/06/2017  . Dyslipidemia 06/21/2017  . Essential hypertension 06/21/2017  . Anxiety states   . Depressive disorder, not elsewhere classified   . Hemorrhage of rectum and anus   . Polyp of colon   . Hyperplasia of prostate   . DM  (diabetes mellitus) ([place])   . 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 Obesity 01/09/2011  . PRECORDIAL PAIN 01/09/2011  . HYPERLIPIDEMIA 01/07/2011  . TMJ PAIN 01/07/2011   Outpatient Encounter Medications as of 09/16/2018  Medication Sig  . aspirin 81 MG EC tablet Take 81 mg by mouth daily.    Marland Kitchen atorvastatin (LIPITOR) 20 MG tablet Take 1 tablet (20 mg total) by mouth daily.  . Blood Glucose Monitoring Suppl (ONE TOUCH ULTRA 2) w/Device KIT Use to test blood sugar twice daily  . enalapril (VASOTEC) 5 MG tablet Take 1 tablet (5 mg total) by mouth daily.  . fluticasone (FLONASE) 50 MCG/ACT nasal spray SPRAY 2 SPRAYS INTO EACH NOSTRIL EVERY DAY  . glucose blood (ONE TOUCH ULTRA TEST) test strip CHECK BG TWICE DAILY  . glyBURIDE (DIABETA) 2.5 MG tablet TAKE 1 TABLET (2.5 MG TOTAL) BY MOUTH DAILY WITH BREAKFAST.  Marland Kitchen omega-3 acid ethyl esters (LOVAZA) 1 g capsule TAKE 2 CAPSULES (2 G TOTAL) BY MOUTH 2 (TWO) TIMES DAILY.  Marland Kitchen ONETOUCH DELICA LANCETS 80K MISC Test BS bid  . venlafaxine XR (EFFEXOR XR) 37.5 MG 24 hr capsule Take 1 capsule (37.5 mg total) by mouth daily with breakfast.  . Vitamin D, Ergocalciferol, (DRISDOL) 50000 units CAPS capsule TAKE 1  CAPSULE BY MOUTH EVERY 7 DAYS  . XIGDUO XR 04-999 MG TB24 TAKE 2 TABLETS BY MOUTH EVERY DAY  . [DISCONTINUED] doxycycline (VIBRA-TABS) 100 MG tablet Take 1 tablet (100 mg total) by mouth 2 (two) times daily. 1 po bid  . [DISCONTINUED] JANUVIA 50 MG tablet TAKE 1 TABLET BY MOUTH EVERY DAY  . [DISCONTINUED] rosuvastatin (CRESTOR) 10 MG tablet Take 1 tablet (10 mg total) by mouth daily.  . [DISCONTINUED] sildenafil (REVATIO) 20 MG tablet Take 2-5 tabs PRN prior to sexual activity   No facility-administered encounter medications on file as of 09/16/2018.      Review of Systems  Constitutional: Negative.   HENT: Negative.   Eyes: Negative.   Respiratory: Negative.   Cardiovascular: Negative.   Gastrointestinal: Positive for abdominal pain  (left lower abd pain ).  Endocrine: Negative.   Genitourinary: Negative.   Musculoskeletal: Positive for arthralgias (left knee weakness).  Skin: Negative.   Allergic/Immunologic: Negative.   Neurological: Negative.   Hematological: Negative.   Psychiatric/Behavioral: Negative.        Objective:   Physical Exam  Constitutional: He is oriented to person, place, and time. He appears well-developed and well-nourished.  The patient is pleasant and doing well.  Other than the 2 complaints he has with his left side and left knee.  HENT:  Head: Normocephalic and atraumatic.  Right Ear: External ear normal.  Left Ear: External ear normal.  Nose: Nose normal.  Mouth/Throat: Oropharynx is clear and moist. No oropharyngeal exudate.  Eyes: Pupils are equal, round, and reactive to light. Conjunctivae and EOM are normal. Right eye exhibits no discharge. Left eye exhibits no discharge. No scleral icterus.  Needs eye exam  Neck: Normal range of motion. Neck supple. No thyromegaly present.  No bruits anterior cervical adenopathy or thyromegaly  Cardiovascular: Normal rate, regular rhythm, normal heart sounds and intact distal pulses.  No murmur heard. Heart has a regular rate and rhythm at 72/min  Pulmonary/Chest: Effort normal and breath sounds normal. He has no wheezes. He has no rales. He exhibits no tenderness.  Clear anteriorly and posteriorly and no axillary adenopathy no chest wall masses  Abdominal: Soft. Bowel sounds are normal. He exhibits no mass. There is no tenderness.  Abdomen is obese without liver or spleen enlargement epigastric tenderness bruits or inguinal adenopathy.  On palpation there was slight tenderness in the left lower quadrant.  Musculoskeletal: Normal range of motion. He exhibits no edema or tenderness.  No left knee joint line tenderness either medially or laterally.  There was some tenderness with extending the leg and pressing on the infra patella tendon on the left  side.  This was reproducible on several occasions.  There was no spine tenderness.  Lymphadenopathy:    He has no cervical adenopathy.  Neurological: He is alert and oriented to person, place, and time. He has normal reflexes. No cranial nerve deficit.  Reflexes were 2+ and equal bilaterally  Skin: Skin is warm and dry. No rash noted.  Psychiatric: He has a normal mood and affect. His behavior is normal. Judgment and thought content normal.  The patient's mood affect and behavior were all normal for him.  Nursing note and vitals reviewed.  BP 138/74 (BP Location: Left Arm)   Pulse 82   Temp 97.9 F (36.6 C) (Oral)   Ht _0  (1.778 m)   Wt 250 lb (113.4 kg)   BMI 35.87 kg/m         Assessment & Plan:  1. Type 2 diabetes mellitus without complication, without long-term current use of insulin ([place]) -Continue aggressive therapeutic lifestyle changes with a goal to lose weight through diet and exercise - CBC with Differential/Platelet - Bayer DCA Hb A1c Waived  2. Pure hypercholesterolemia -Continue current treatment pending results of lab work - Lipid panel - CBC with Differential/Platelet  3. Vitamin D deficiency -Continue with vitamin D replacement pending results of lab work - CBC with Differential/Platelet - VITAMIN D 25 Hydroxy (Vit-D Deficiency, Fractures)  4. Essential hypertension, benign -Blood pressure readings were good today and he will continue with current treatment, weight loss and sodium restriction - BMP8+EGFR - CBC with Differential/Platelet - Hepatic function panel  5. Morbid obesity ([place]) -Aggressive therapeutic lifestyle changes  6. Testosterone deficiency -Patient has a history of testosterone deficiency but has deferred any treatment for this in the past.  7. Strain of left patellar tendon, initial encounter -Warm wet compresses, Aleve over-the-counter 1 twice daily after breakfast and supper for 7 to 10 days and if no relief call us back and  we will arrange for visit to the orthopedic surgeon to come to this office  8. Left lower quadrant abdominal tenderness without rebound tenderness -Continue to monitor this as it may also be musculoskeletal in nature and will see what the NSAID dose to help this.  If this pain continues may need an ultrasound of the abdomen.  Patient has been reminded by the gastroenterologist that he needs to come back and get his 5-year repeat colonoscopy.  I told the patient to follow through with this and get this done as soon as possible.  Patient Instructions  Continue current medications. Continue good therapeutic lifestyle changes which include good diet and exercise. Fall precautions discussed with patient. If an FOBT was given today- please return it to our front desk. If you are over 3 years old - you may need Prevnar 61 or the adult Pneumonia vaccine.  **Flu shots are available--- please call and schedule a FLU-CLINIC appointment**  After your visit with [place] today you will receive a survey in the mail or online from [employer] regarding your care with [place]. Please take a moment to fill this out. Your feedback is very important to [place] as you can help [place] better understand your patient needs as well as improve your experience and satisfaction. WE CARE ABOUT YOU!!!   Continue to make all efforts to lose weight through diet and exercise.  Take medications regularly. Check blood sugars regularly. Use warm wet compresses to anterior left knee for 20 minutes 3 or 4 times daily if possible Take Aleve 1 twice daily after breakfast and supper for the next 7 to 10 days If no relief in knee pain get back in touch we will make arrangements for you to be seen by the orthopedic surgeons to come to this office Be sure and call and schedule your visit with the gastroenterologist since they call and requested you to come back for a repeat colonoscopy You desperately need to get an eye exam at least once yearly in your  way behind on doing this.  We will give you the names of ophthalmologists in [place] with [facility]  Arrie Senate MD

## 2018-09-16 NOTE — Patient Instructions (Addendum)
Continue current medications. Continue good therapeutic lifestyle changes which include good diet and exercise. Fall precautions discussed with patient. If an FOBT was given today- please return it to our front desk. If you are over 66 years old - you may need Prevnar 18 or the adult Pneumonia vaccine.  **Flu shots are available--- please call and schedule a FLU-CLINIC appointment**  After your visit with [place] today you will receive a survey in the mail or online from [employer] regarding your care with [place]. Please take a moment to fill this out. Your feedback is very important to [place] as you can help [place] better understand your patient needs as well as improve your experience and satisfaction. WE CARE ABOUT YOU!!!   Continue to make all efforts to lose weight through diet and exercise.  Take medications regularly. Check blood sugars regularly. Use warm wet compresses to anterior left knee for 20 minutes 3 or 4 times daily if possible Take Aleve 1 twice daily after breakfast and supper for the next 7 to 10 days If no relief in knee pain get back in touch we will make arrangements for you to be seen by the orthopedic surgeons to come to this office Be sure and call and schedule your visit with the gastroenterologist since they call and requested you to come back for a repeat colonoscopy You desperately need to get an eye exam at least once yearly in your way behind on doing this.  We will give you the names of ophthalmologists in [facility] with [facility]

## 2018-09-20 ENCOUNTER — Other Ambulatory Visit: Payer: Self-pay | Admitting: Family Medicine

## 2018-09-21 ENCOUNTER — Other Ambulatory Visit: Payer: Self-pay | Admitting: Family Medicine

## 2018-10-07 ENCOUNTER — Other Ambulatory Visit: Payer: Self-pay | Admitting: Family Medicine

## 2018-10-15 IMAGING — DX DG FOOT COMPLETE 3+V*L*
3 series · 3 of 3 positions shown · non-contrast
Comparison: None.

CLINICAL DATA: Lateral foot pain, no known injury, initial
encounter

EXAM:
LEFT FOOT - COMPLETE 3+ VIEW

[foot ap]
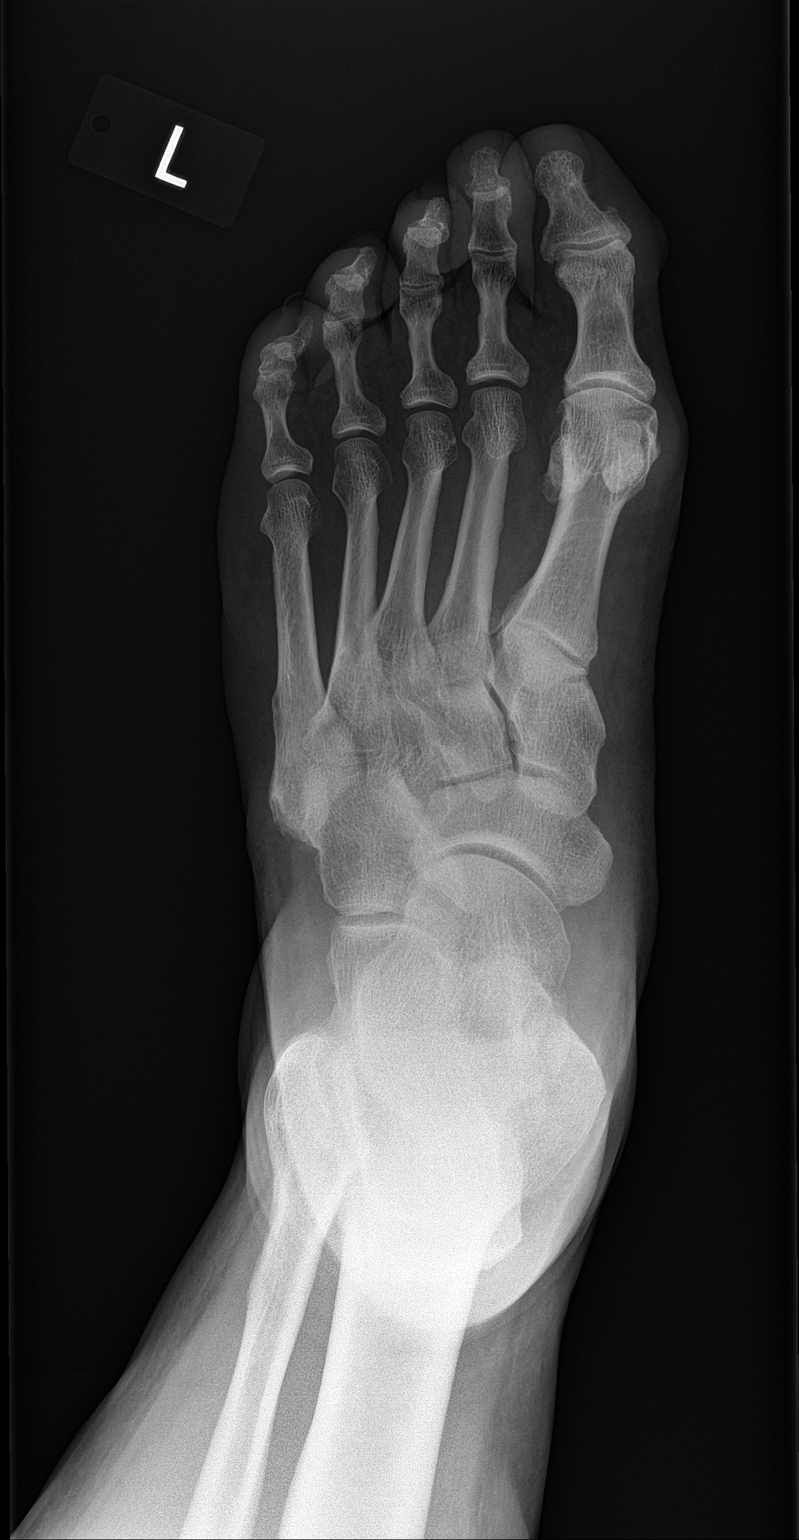

[foot obl]
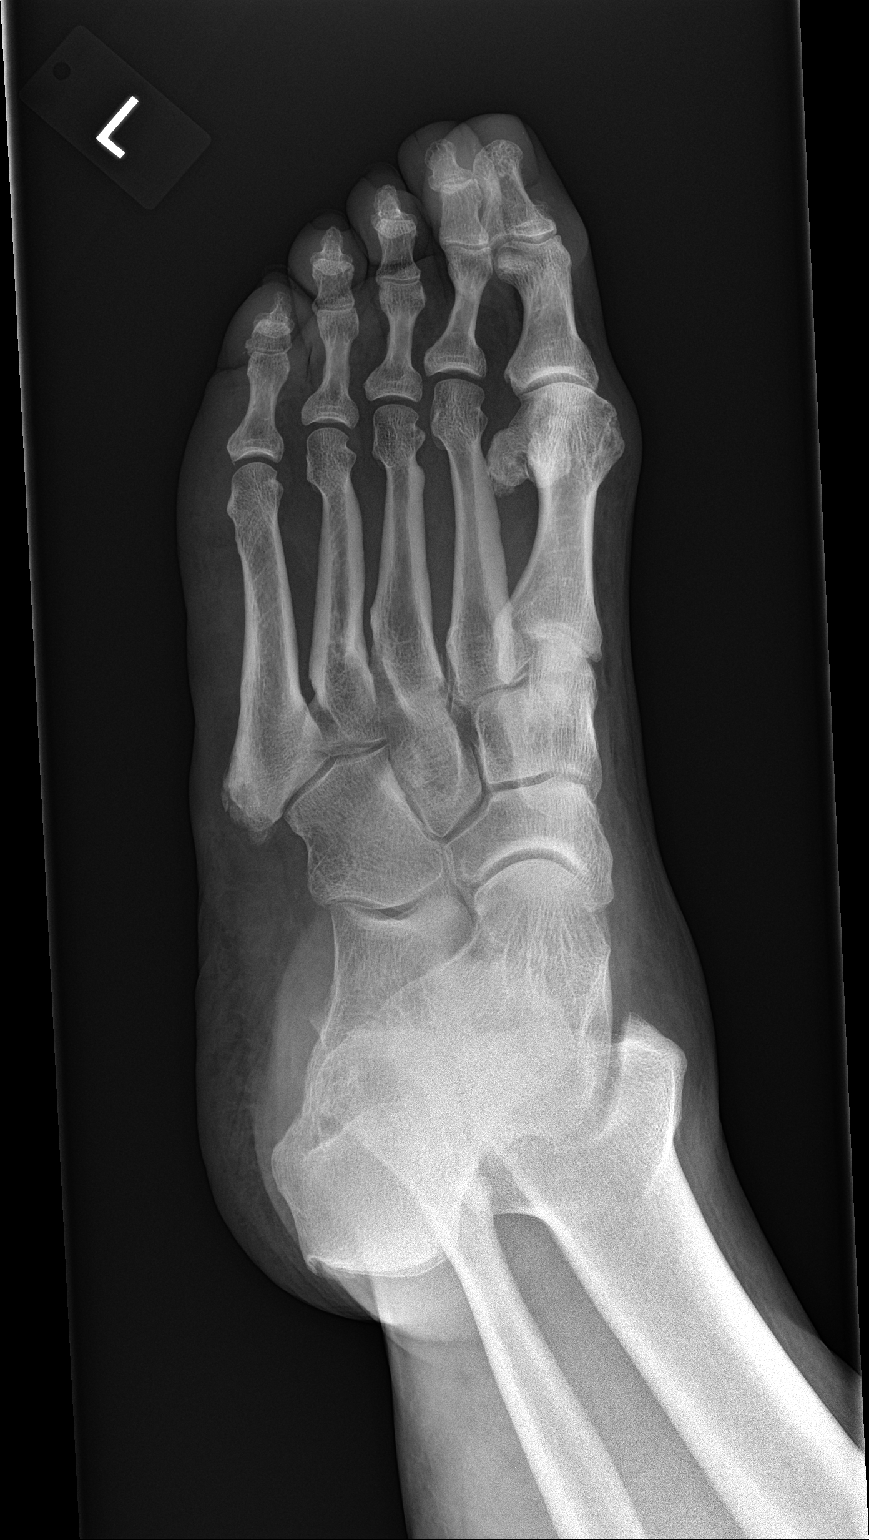

[foot lat]
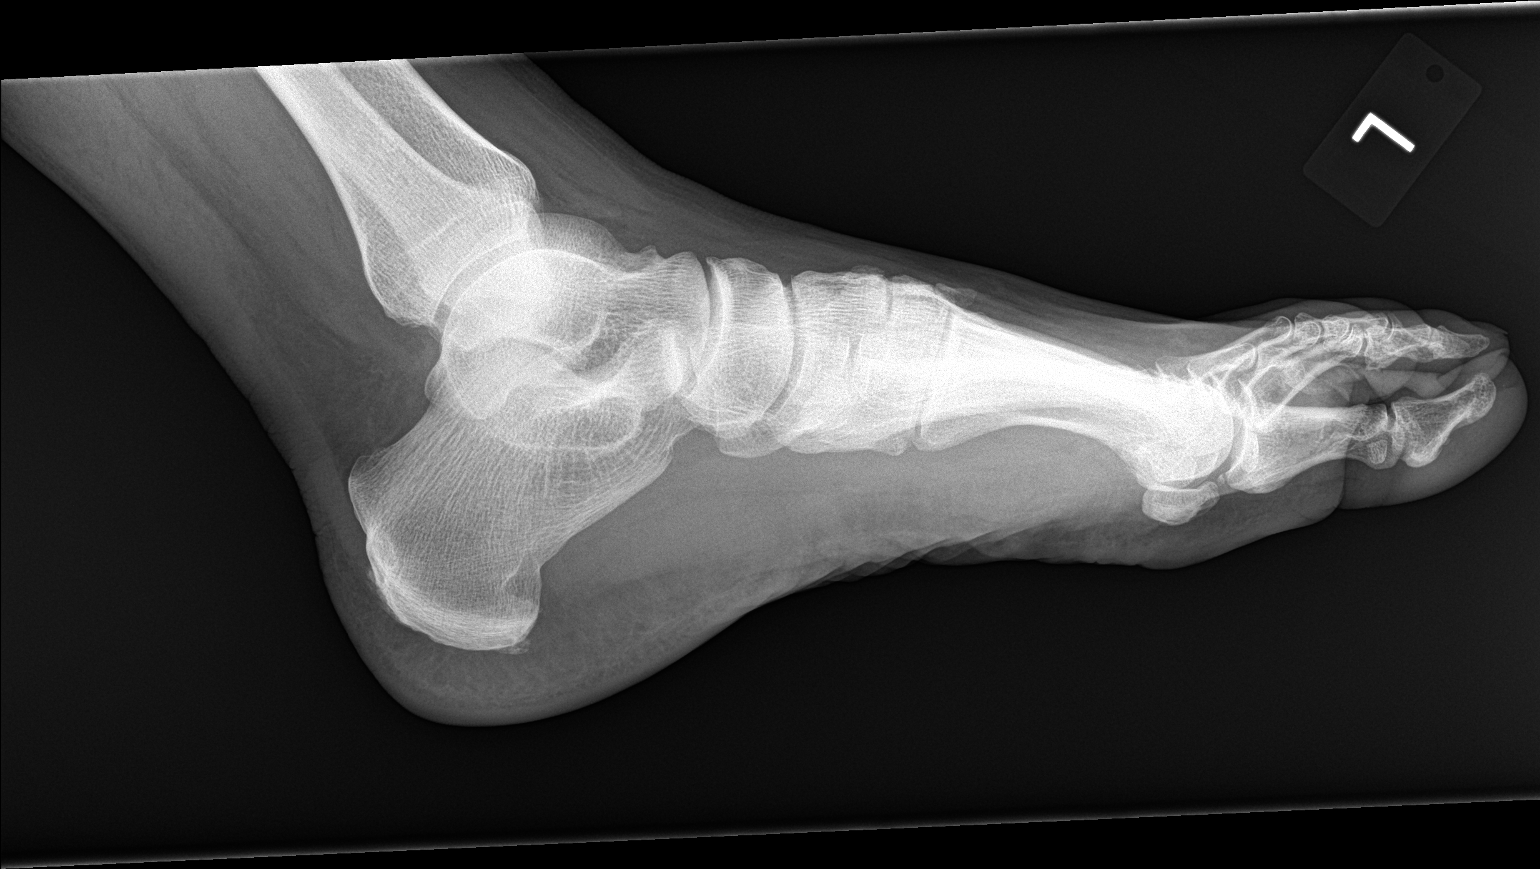

[3 of 3 positions shown; findings below may reference images not displayed]

FINDINGS: There is no evidence of fracture or dislocation. There is no
evidence of arthropathy or other focal bone abnormality. Soft
tissues are unremarkable.
IMPRESSION: No acute abnormality noted.

## 2018-10-20 ENCOUNTER — Other Ambulatory Visit: Payer: Self-pay | Admitting: Family Medicine

## 2018-10-20 ENCOUNTER — Other Ambulatory Visit: Payer: BLUE CROSS/BLUE SHIELD

## 2018-10-20 DIAGNOSIS — E559 Vitamin D deficiency, unspecified: Secondary | ICD-10-CM | POA: Diagnosis not present

## 2018-10-20 DIAGNOSIS — E119 Type 2 diabetes mellitus without complications: Secondary | ICD-10-CM | POA: Diagnosis not present

## 2018-10-20 DIAGNOSIS — E7800 Pure hypercholesterolemia, unspecified: Secondary | ICD-10-CM | POA: Diagnosis not present

## 2018-10-20 DIAGNOSIS — I10 Essential (primary) hypertension: Secondary | ICD-10-CM | POA: Diagnosis not present

## 2018-10-20 LAB — BAYER DCA HB A1C WAIVED: HB A1C: 8 % — AB (ref <7.0)

## 2018-10-21 LAB — CBC WITH DIFFERENTIAL/PLATELET
BASOS: 0 %
Basophils Absolute: 0 10*3/uL (ref 0.0–0.2)
EOS (ABSOLUTE): 0.3 10*3/uL (ref 0.0–0.4)
EOS: 4 %
Hematocrit: 46.7 % (ref 37.5–51.0)
Hemoglobin: 15.6 g/dL (ref 13.0–17.7)
IMMATURE GRANULOCYTES: 0 %
Immature Grans (Abs): 0 10*3/uL (ref 0.0–0.1)
Lymphocytes Absolute: 1.6 10*3/uL (ref 0.7–3.1)
Lymphs: 24 %
MCH: 26.6 pg (ref 26.6–33.0)
MCHC: 33.4 g/dL (ref 31.5–35.7)
MCV: 80 fL (ref 79–97)
MONOCYTES: 8 %
Monocytes Absolute: 0.5 10*3/uL (ref 0.1–0.9)
NEUTROS PCT: 64 %
Neutrophils Absolute: 4.5 10*3/uL (ref 1.4–7.0)
PLATELETS: 237 10*3/uL (ref 150–450)
RBC: 5.87 x10E6/uL — AB (ref 4.14–5.80)
RDW: 13.1 % (ref 12.3–15.4)
WBC: 6.9 10*3/uL (ref 3.4–10.8)

## 2018-10-21 LAB — LIPID PANEL
CHOLESTEROL TOTAL: 112 mg/dL (ref 100–199)
Chol/HDL Ratio: 3.2 ratio (ref 0.0–5.0)
HDL: 35 mg/dL — AB (ref >39)
LDL Calculated: 57 mg/dL (ref 0–99)
Triglycerides: 100 mg/dL (ref 0–149)
VLDL CHOLESTEROL CAL: 20 mg/dL (ref 5–40)

## 2018-10-21 LAB — HEPATIC FUNCTION PANEL
ALT: 30 IU/L (ref 0–44)
AST: 19 IU/L (ref 0–40)
Albumin: 4.3 g/dL (ref 3.6–4.8)
Alkaline Phosphatase: 85 IU/L (ref 39–117)
Bilirubin Total: 0.5 mg/dL (ref 0.0–1.2)
Bilirubin, Direct: 0.16 mg/dL (ref 0.00–0.40)
TOTAL PROTEIN: 6.4 g/dL (ref 6.0–8.5)

## 2018-10-21 LAB — BMP8+EGFR
BUN/Creatinine Ratio: 12 (ref 10–24)
BUN: 9 mg/dL (ref 8–27)
CALCIUM: 9.4 mg/dL (ref 8.6–10.2)
CO2: 21 mmol/L (ref 20–29)
Chloride: 103 mmol/L (ref 96–106)
Creatinine, Ser: 0.76 mg/dL (ref 0.76–1.27)
GFR calc non Af Amer: 95 mL/min/{1.73_m2} (ref >59)
GFR, EST AFRICAN AMERICAN: 110 mL/min/{1.73_m2} (ref >59)
GLUCOSE: 180 mg/dL — AB (ref 65–99)
POTASSIUM: 4 mmol/L (ref 3.5–5.2)
Sodium: 140 mmol/L (ref 134–144)

## 2018-10-21 LAB — VITAMIN D 25 HYDROXY (VIT D DEFICIENCY, FRACTURES): VIT D 25 HYDROXY: 47.9 ng/mL (ref 30.0–100.0)

## 2018-10-22 ENCOUNTER — Other Ambulatory Visit: Payer: Self-pay | Admitting: Family Medicine

## 2018-12-18 ENCOUNTER — Other Ambulatory Visit: Payer: Self-pay | Admitting: Family Medicine

## 2018-12-28 ENCOUNTER — Other Ambulatory Visit: Payer: Self-pay | Admitting: Family Medicine

## 2018-12-28 NOTE — Telephone Encounter (Signed)
Last Vit D 10/20/18  47.9

## 2019-01-15 ENCOUNTER — Other Ambulatory Visit: Payer: Self-pay | Admitting: Family Medicine

## 2019-01-20 ENCOUNTER — Ambulatory Visit (INDEPENDENT_AMBULATORY_CARE_PROVIDER_SITE_OTHER): Payer: BLUE CROSS/BLUE SHIELD | Admitting: Family Medicine

## 2019-01-20 ENCOUNTER — Encounter: Payer: Self-pay | Admitting: Family Medicine

## 2019-01-20 VITALS — BP 121/69 | HR 88 | Temp 97.2°F | Ht 70.0 in | Wt 251.0 lb

## 2019-01-20 DIAGNOSIS — E559 Vitamin D deficiency, unspecified: Secondary | ICD-10-CM

## 2019-01-20 DIAGNOSIS — Z8249 Family history of ischemic heart disease and other diseases of the circulatory system: Secondary | ICD-10-CM

## 2019-01-20 DIAGNOSIS — E119 Type 2 diabetes mellitus without complications: Secondary | ICD-10-CM | POA: Diagnosis not present

## 2019-01-20 DIAGNOSIS — E7800 Pure hypercholesterolemia, unspecified: Secondary | ICD-10-CM

## 2019-01-20 DIAGNOSIS — E349 Endocrine disorder, unspecified: Secondary | ICD-10-CM

## 2019-01-20 DIAGNOSIS — I10 Essential (primary) hypertension: Secondary | ICD-10-CM | POA: Diagnosis not present

## 2019-01-20 DIAGNOSIS — M25562 Pain in left knee: Secondary | ICD-10-CM

## 2019-01-20 NOTE — Progress Notes (Signed)
Subjective:    Patient ID: Richard Nelson., male    DOB: 03-26-1951, 68 y.o.   MRN: [id number]  HPI Pt here for follow up and management of chronic medical problems which includes diabetes and hypertension. He is taking medication regularly.  The patient is doing well overall but does complain of his ears being stopped up and some sinus drainage and some right shoulder blade pain especially with lying down.  He will get lab work today.  His vital signs are stable his weight is up 1 pound and his body mass index is 35.87.  The patient today is very stressed with dealing with his wife and his parents and a daughter that is out of the picture.  He knows that he eats incorrectly and does not take good care of himself and there is a strong family history of heart disease.  He needs to get motivated.  I told him that he has morbid obesity based upon a BMI greater than 35 with 2 or more comorbid health conditions.  Today he denies any chest pain other than sometimes some discomfort with stress.  He denies any shortness of breath.  He denies any trouble with his stomach including nausea vomiting diarrhea blood in the stool or black tarry bowel movements.  He is passing his water without problems.     Patient Active Problem List   Diagnosis Date Noted  . Erectile dysfunction due to arterial insufficiency 08/06/2017  . Dyslipidemia 06/21/2017  . Essential hypertension 06/21/2017  . Anxiety states   . Depressive disorder, not elsewhere classified   . Hemorrhage of rectum and anus   . Polyp of colon   . Hyperplasia of prostate   . DM (diabetes mellitus) ([place])   . 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 Obesity 01/09/2011  . PRECORDIAL PAIN 01/09/2011  . HYPERLIPIDEMIA 01/07/2011  . TMJ PAIN 01/07/2011   Outpatient Encounter Medications as of 01/20/2019  Medication Sig  . aspirin 81 MG EC tablet Take 81 mg by mouth daily.    Marland Kitchen atorvastatin (LIPITOR) 20 MG tablet Take 1 tablet (20 mg  total) by mouth daily.  . Blood Glucose Monitoring Suppl (ONE TOUCH ULTRA 2) w/Device KIT Use to test blood sugar twice daily  . enalapril (VASOTEC) 5 MG tablet Take 1 tablet (5 mg total) by mouth daily.  . fluticasone (FLONASE) 50 MCG/ACT nasal spray SPRAY 2 SPRAYS INTO EACH NOSTRIL EVERY DAY  . glucose blood (ONE TOUCH ULTRA TEST) test strip CHECK BLOOD GLUCOSE TWICE DAILY  . glyBURIDE (DIABETA) 2.5 MG tablet TAKE 1 TABLET (2.5 MG TOTAL) BY MOUTH DAILY WITH BREAKFAST.  Marland Kitchen JANUVIA 50 MG tablet TAKE 1 TABLET BY MOUTH EVERY DAY  . omega-3 acid ethyl esters (LOVAZA) 1 g capsule TAKE 2 CAPSULES (2 G TOTAL) BY MOUTH 2 (TWO) TIMES DAILY.  Marland Kitchen ONETOUCH DELICA LANCETS 25D MISC Test BS bid  . venlafaxine XR (EFFEXOR-XR) 37.5 MG 24 hr capsule TAKE 1 CAPSULE (37.5 MG TOTAL) BY MOUTH DAILY WITH BREAKFAST.  Marland Kitchen Vitamin D, Ergocalciferol, (DRISDOL) 1.25 MG (50000 UT) CAPS capsule TAKE ONE CAPSULE BY MOUTH ONE TIME PER WEEK  . XIGDUO XR 04-999 MG TB24 TAKE 2 TABLETS BY MOUTH EVERY DAY  . [DISCONTINUED] venlafaxine XR (EFFEXOR-XR) 37.5 MG 24 hr capsule TAKE 1 CAPSULE (37.5 MG TOTAL) BY MOUTH DAILY WITH BREAKFAST.   No facility-administered encounter medications on file as of 01/20/2019.      Review of Systems  Constitutional: Negative.  HENT: Positive for ear pain and postnasal drip.   Eyes: Negative.   Respiratory: Negative.   Cardiovascular: Negative.   Gastrointestinal: Negative.   Endocrine: Negative.   Genitourinary: Negative.   Musculoskeletal: Positive for arthralgias (left knee pain -on-going /// right shoulder blade pain when laying ).  Skin: Negative.   Allergic/Immunologic: Negative.   Neurological: Negative.   Hematological: Negative.   Psychiatric/Behavioral: Negative.        Objective:   Physical Exam Vitals signs and nursing note reviewed.  Constitutional:      General: He is not in acute distress.    Appearance: Normal appearance. He is well-developed. He is obese. He is not  ill-appearing.     Comments: Patient is pleasant but stressed with family issues.  HENT:     Head: Normocephalic and atraumatic.     Right Ear: Tympanic membrane, ear canal and external ear normal. There is no impacted cerumen.     Left Ear: Tympanic membrane, ear canal and external ear normal. There is no impacted cerumen.     Nose: Nose normal. No congestion or rhinorrhea.     Mouth/Throat:     Mouth: Mucous membranes are moist.     Pharynx: No oropharyngeal exudate.     Comments: Slightly red posteriorly but no anterior cervical adenopathy Eyes:     General: No scleral icterus.       Right eye: No discharge.        Left eye: No discharge.     Extraocular Movements: Extraocular movements intact.     Conjunctiva/sclera: Conjunctivae normal.     Pupils: Pupils are equal, round, and reactive to light.  Neck:     Musculoskeletal: Normal range of motion and neck supple.     Thyroid: No thyromegaly.     Vascular: No carotid bruit.     Trachea: No tracheal deviation.     Comments: No thyromegaly bruits or adenopathy Cardiovascular:     Rate and Rhythm: Normal rate and regular rhythm.     Pulses: Normal pulses.     Heart sounds: Normal heart sounds. No murmur.     Comments: Heart is regular at 72/min with no edema and good pedal pulses Pulmonary:     Effort: Pulmonary effort is normal.     Breath sounds: Normal breath sounds. No wheezing or rales.     Comments: Clear anteriorly and posteriorly and no axillary adenopathy Chest:     Chest wall: No tenderness.  Abdominal:     General: Bowel sounds are normal.     Palpations: Abdomen is soft. There is no mass.     Tenderness: There is no abdominal tenderness. There is no guarding.     Comments: Obese without masses tenderness organ enlargement or bruits  Musculoskeletal: Normal range of motion.        General: Tenderness present.     Right lower leg: No edema.     Left lower leg: No edema.     Comments: Tenderness anterior left knee   Lymphadenopathy:     Cervical: No cervical adenopathy.  Skin:    General: Skin is warm and dry.     Findings: No rash.  Neurological:     General: No focal deficit present.     Mental Status: He is alert and oriented to person, place, and time. Mental status is at baseline.     Cranial Nerves: No cranial nerve deficit.     Gait: Gait normal.     Deep  Tendon Reflexes: Reflexes are normal and symmetric. Reflexes normal.  Psychiatric:        Mood and Affect: Mood normal.        Behavior: Behavior normal.        Thought Content: Thought content normal.        Judgment: Judgment normal.     Comments: Patient is under a lot of stress from life and parents     BP 121/69 (BP Location: Left Arm)   Pulse 88   Temp (!) 97.2 F (36.2 C) (Oral)   Ht 5' 10" (1.778 m)   Wt 251 lb (113.9 kg)   BMI 36.01 kg/m        Assessment & Plan:  1. Type 2 diabetes mellitus without complication, without long-term current use of insulin ([facility]) -To need to monitor blood sugars closely -Continue to make every effort to lose weight through diet and exercise - CBC with Differential/Platelet; Future - Bayer DCA Hb A1c Waived; Future  2. Pure hypercholesterolemia -Continue current treatment pending results of lab work - CBC with Differential/Platelet; Future - Lipid panel; Future  3. Essential hypertension, benign -Blood pressure is good today and continue with current treatment - BMP8+EGFR; Future - CBC with Differential/Platelet; Future - Hepatic function panel; Future  4. Vitamin D deficiency -Continue vitamin D replacement pending results of lab work - CBC with Differential/Platelet; Future - VITAMIN D 25 Hydroxy (Vit-D Deficiency, Fractures); Future  5. Testosterone deficiency -Check testosterone level at next blood draw - CBC with Differential/Platelet; Future  6. Morbid obesity ([facility]) -Lose weight through diet and exercise.  Would be willing to make referral to registered dietitian  for help if patient so desires  7. Family history of chronic ischemic heart disease -Reschedule visit with cardiologist for routine follow-up  Patient Instructions                       Medicare Annual Wellness Visit  [place] and the medical providers at [facility] strive to bring you the best medical care.  In doing so we not only want to address your current medical conditions and concerns but also to detect new conditions early and prevent illness, disease and health-related problems.    Medicare offers a yearly Wellness Visit which allows our clinical staff to assess your need for preventative services including immunizations, lifestyle education, counseling to decrease risk of preventable diseases and screening for fall risk and other medical concerns.    This visit is provided free of charge (no copay) for all Medicare recipients. The clinical pharmacists at [facility] have begun to conduct these Wellness Visits which will also include a thorough review of all your medications.    As you primary medical provider recommend that you make an appointment for your Annual Wellness Visit if you have not done so already this year.  You may set up this appointment before you leave today or you may call back ([phone]) and schedule an appointment.  Please make sure when you call that you mention that you are scheduling your Annual Wellness Visit with the clinical pharmacist so that the appointment may be made for the proper length of time.     Continue current medications. Continue good therapeutic lifestyle changes which include good diet and exercise. Fall precautions discussed with patient. If an FOBT was given today- please return it to our front desk. If you are over 50 years old - you may need Prevnar   13 or the adult Pneumonia vaccine.  **Flu shots are available--- please call and schedule a FLU-CLINIC appointment**  After your visit with  us today you will receive a survey in the mail or online from [employer] regarding your care with us. Please take a moment to fill this out. Your feedback is very important to us as you can help us better understand your patient needs as well as improve your experience and satisfaction. WE CARE ABOUT YOU!!!   Make sure that you see the ophthalmologist yearly Work aggressively on weight loss through diet and exercise Check blood sugars regularly Decrease drinks and only drink water Reduce bread and carbs intake Pray for strength and remember you can only do what you can do you can do everything for everyone We will schedule you for a visit with the orthopedist that comes here regarding your knee We will schedule you for a visit for routine follow-up with the cardiologist that comes next-door because of your strong family history of heart disease and your risk factors.  Don W. Moore MD   

## 2019-01-20 NOTE — Addendum Note (Signed)
Addended by: Zannie Cove on: 01/20/2019 05:17 PM   Modules accepted: Orders

## 2019-01-20 NOTE — Patient Instructions (Addendum)
Medicare Annual Wellness Visit  [place] and the medical providers at [facility] strive to bring you the best medical care.  In doing so we not only want to address your current medical conditions and concerns but also to detect new conditions early and prevent illness, disease and health-related problems.    Medicare offers a yearly Wellness Visit which allows our clinical staff to assess your need for preventative services including immunizations, lifestyle education, counseling to decrease risk of preventable diseases and screening for fall risk and other medical concerns.    This visit is provided free of charge (no copay) for all Medicare recipients. The clinical pharmacists at [facility] have begun to conduct these Wellness Visits which will also include a thorough review of all your medications.    As you primary medical provider recommend that you make an appointment for your Annual Wellness Visit if you have not done so already this year.  You may set up this appointment before you leave today or you may call back ([phone]) and schedule an appointment.  Please make sure when you call that you mention that you are scheduling your Annual Wellness Visit with the clinical pharmacist so that the appointment may be made for the proper length of time.     Continue current medications. Continue good therapeutic lifestyle changes which include good diet and exercise. Fall precautions discussed with patient. If an FOBT was given today- please return it to our front desk. If you are over 82 years old - you may need Prevnar 45 or the adult Pneumonia vaccine.  **Flu shots are available--- please call and schedule a FLU-CLINIC appointment**  After your visit with [place] today you will receive a survey in the mail or online from [employer] regarding your care with [place]. Please take a moment to fill this out. Your feedback is very  important to [place] as you can help [place] better understand your patient needs as well as improve your experience and satisfaction. WE CARE ABOUT YOU!!!   Make sure that you see the ophthalmologist yearly Work aggressively on weight loss through diet and exercise Check blood sugars regularly Decrease drinks and only drink water Reduce bread and carbs intake Pray for strength and remember you can only do what you can do you can do everything for everyone We will schedule you for a visit with the orthopedist that comes here regarding your knee We will schedule you for a visit for routine follow-up with the cardiologist that comes next-door because of your strong family history of heart disease and your risk factors.

## 2019-02-01 ENCOUNTER — Other Ambulatory Visit: Payer: BLUE CROSS/BLUE SHIELD

## 2019-02-01 DIAGNOSIS — E119 Type 2 diabetes mellitus without complications: Secondary | ICD-10-CM

## 2019-02-01 DIAGNOSIS — E7800 Pure hypercholesterolemia, unspecified: Secondary | ICD-10-CM | POA: Diagnosis not present

## 2019-02-01 DIAGNOSIS — I10 Essential (primary) hypertension: Secondary | ICD-10-CM | POA: Diagnosis not present

## 2019-02-01 DIAGNOSIS — E559 Vitamin D deficiency, unspecified: Secondary | ICD-10-CM

## 2019-02-01 DIAGNOSIS — E349 Endocrine disorder, unspecified: Secondary | ICD-10-CM

## 2019-02-01 LAB — BAYER DCA HB A1C WAIVED: HB A1C (BAYER DCA - WAIVED): 8.7 % — ABNORMAL HIGH (ref <7.0)

## 2019-02-02 LAB — BMP8+EGFR
BUN/Creatinine Ratio: 12 (ref 10–24)
BUN: 10 mg/dL (ref 8–27)
CO2: 21 mmol/L (ref 20–29)
Calcium: 9.2 mg/dL (ref 8.6–10.2)
Chloride: 104 mmol/L (ref 96–106)
Creatinine, Ser: 0.83 mg/dL (ref 0.76–1.27)
GFR calc Af Amer: 106 mL/min/{1.73_m2} (ref >59)
GFR calc non Af Amer: 92 mL/min/{1.73_m2} (ref >59)
GLUCOSE: 190 mg/dL — AB (ref 65–99)
POTASSIUM: 4.3 mmol/L (ref 3.5–5.2)
Sodium: 141 mmol/L (ref 134–144)

## 2019-02-02 LAB — CBC WITH DIFFERENTIAL/PLATELET
BASOS ABS: 0 10*3/uL (ref 0.0–0.2)
Basos: 0 %
EOS (ABSOLUTE): 0.2 10*3/uL (ref 0.0–0.4)
Eos: 3 %
Hematocrit: 46.3 % (ref 37.5–51.0)
Hemoglobin: 15.5 g/dL (ref 13.0–17.7)
IMMATURE GRANULOCYTES: 0 %
Immature Grans (Abs): 0 10*3/uL (ref 0.0–0.1)
Lymphocytes Absolute: 1.9 10*3/uL (ref 0.7–3.1)
Lymphs: 27 %
MCH: 26.5 pg — ABNORMAL LOW (ref 26.6–33.0)
MCHC: 33.5 g/dL (ref 31.5–35.7)
MCV: 79 fL (ref 79–97)
Monocytes Absolute: 0.6 10*3/uL (ref 0.1–0.9)
Monocytes: 8 %
Neutrophils Absolute: 4.4 10*3/uL (ref 1.4–7.0)
Neutrophils: 62 %
Platelets: 235 10*3/uL (ref 150–450)
RBC: 5.85 x10E6/uL — ABNORMAL HIGH (ref 4.14–5.80)
RDW: 12.4 % (ref 11.6–15.4)
WBC: 7.2 10*3/uL (ref 3.4–10.8)

## 2019-02-02 LAB — HEPATIC FUNCTION PANEL
ALT: 27 IU/L (ref 0–44)
AST: 21 IU/L (ref 0–40)
Albumin: 4.5 g/dL (ref 3.8–4.8)
Alkaline Phosphatase: 91 IU/L (ref 39–117)
BILIRUBIN, DIRECT: 0.11 mg/dL (ref 0.00–0.40)
Bilirubin Total: 0.4 mg/dL (ref 0.0–1.2)
Total Protein: 6.2 g/dL (ref 6.0–8.5)

## 2019-02-02 LAB — LIPID PANEL
Chol/HDL Ratio: 2.8 ratio (ref 0.0–5.0)
Cholesterol, Total: 99 mg/dL — ABNORMAL LOW (ref 100–199)
HDL: 35 mg/dL — ABNORMAL LOW (ref >39)
LDL Calculated: 50 mg/dL (ref 0–99)
Triglycerides: 71 mg/dL (ref 0–149)
VLDL Cholesterol Cal: 14 mg/dL (ref 5–40)

## 2019-02-02 LAB — VITAMIN D 25 HYDROXY (VIT D DEFICIENCY, FRACTURES): Vit D, 25-Hydroxy: 47.5 ng/mL (ref 30.0–100.0)

## 2019-02-05 ENCOUNTER — Other Ambulatory Visit: Payer: Self-pay | Admitting: Family Medicine

## 2019-02-05 MED ORDER — ONETOUCH DELICA LANCETS 33G MISC: 2 refills | Status: DC

## 2019-02-05 MED ORDER — GLUCOSE BLOOD VI STRP: ORAL_STRIP | 3 refills | Status: DC

## 2019-02-05 NOTE — Telephone Encounter (Signed)
Sent to pharmacy 

## 2019-02-05 NOTE — Telephone Encounter (Signed)
What is the name of the medication? One Touch Verio Flex Lancets and strips  Have you contacted your pharmacy to request a refill? NO  Which pharmacy would you like this sent to? [facility] in [place]   Patient notified that their request is being sent to the clinical staff for review and that they should receive a call once it is complete. If they do not receive a call within 24 hours they can check with their pharmacy or our office.

## 2019-02-08 ENCOUNTER — Telehealth: Payer: Self-pay | Admitting: *Deleted

## 2019-02-08 ENCOUNTER — Telehealth: Payer: Self-pay | Admitting: Family Medicine

## 2019-02-08 MED ORDER — ONETOUCH VERIO W/DEVICE KIT: 1.0000 | PACK | Freq: Every day | 0 refills | Status: AC

## 2019-02-08 MED ORDER — ONETOUCH DELICA LANCETS 33G MISC: 2 refills | Status: DC

## 2019-02-08 MED ORDER — GLUCOSE BLOOD VI STRP: ORAL_STRIP | 12 refills | Status: DC

## 2019-02-08 NOTE — Telephone Encounter (Signed)
Correct meter sent to pharmacy

## 2019-02-08 NOTE — Telephone Encounter (Signed)
Closing encounter completed in another encounter

## 2019-02-16 ENCOUNTER — Other Ambulatory Visit: Payer: Self-pay | Admitting: Family Medicine

## 2019-02-17 DIAGNOSIS — Z96652 Presence of left artificial knee joint: Secondary | ICD-10-CM | POA: Insufficient documentation

## 2019-02-24 DIAGNOSIS — Z9889 Other specified postprocedural states: Secondary | ICD-10-CM | POA: Diagnosis not present

## 2019-02-24 DIAGNOSIS — M25562 Pain in left knee: Secondary | ICD-10-CM | POA: Diagnosis not present

## 2019-02-24 DIAGNOSIS — M7652 Patellar tendinitis, left knee: Secondary | ICD-10-CM | POA: Diagnosis not present

## 2019-03-13 ENCOUNTER — Other Ambulatory Visit: Payer: Self-pay | Admitting: Family Medicine

## 2019-03-16 ENCOUNTER — Other Ambulatory Visit: Payer: Self-pay | Admitting: Family Medicine

## 2019-04-06 ENCOUNTER — Other Ambulatory Visit: Payer: Self-pay | Admitting: Family Medicine

## 2019-04-07 ENCOUNTER — Other Ambulatory Visit: Payer: Self-pay | Admitting: Family Medicine

## 2019-04-09 ENCOUNTER — Telehealth: Payer: Self-pay | Admitting: Cardiology

## 2019-04-09 NOTE — Telephone Encounter (Signed)
Spoke with pt who gives verbal consent for virtual visit schduled with Dr Percival Spanish 4/27.  Information regarding the instructions and consent sent to pt via MyChart.

## 2019-04-09 NOTE — Telephone Encounter (Signed)
New message   Patient is returning call about his upcoming appt with Dr. Percival Spanish.

## 2019-04-11 ENCOUNTER — Other Ambulatory Visit: Payer: Self-pay | Admitting: Family Medicine

## 2019-04-11 NOTE — Progress Notes (Signed)
Virtual Visit via Video Note   This visit type was conducted due to national recommendations for restrictions regarding the COVID-19 Pandemic (e.g. social distancing) in an effort to limit this patient's exposure and mitigate transmission in our community.  Due to his co-morbid illnesses, this patient is at least at moderate risk for complications without adequate follow up.  This format is felt to be most appropriate for this patient at this time.  All issues noted in this document were discussed and addressed.  A limited physical exam was performed with this format.  Please refer to the patient's chart for his consent to telehealth for [facility].   Evaluation Performed:  Follow-up visit  Date:  04/12/2019   ID:  Richard Nelson., DOB 1951/10/27, MRN [id number]  Patient Location: Home Provider Location: Home  PCP:  Chipper Herb, MD  Cardiologist:  Minus Breeding, MD  Electrophysiologist:  None   Chief Complaint:  Palpitations   History of Present Illness:    Richard Gravlin. is a 68 y.o. male who presents for follow up of chest pain.  He was referred by Dr. Laurance Flatten.  He has had an abnormal POET (Plain Old Exercise Treadmill) in 2004 but a negative  perfusion study.  I last saw him in 2012.  He was again having chest pain.  I sent him for a POET (Plain Old Exercise Treadmill).  This was negative for ischemia.    I last saw him in 2018.    He had chest pain at that time but a negative perfusion study.     Since I last saw him he is not describing any change in his symptoms.  He sporadically get some discomfort.  This is sharp.  It happens at rest.  He cannot bring it on with activity.  He walks his big dogs and does not bring the symptoms on.  He denies any chest pressure, neck or arm discomfort consistent with unstable angina or new onset angina.  This is the same discomfort that he had when he had his negative perfusion study in 2018.  He reports a lot of stress and that he  is quick to anger.  His blood sugars not well controlled.  He is eating poorly.  He is dealing with elderly parents and a wife who has chronic illness.  The patient does not have symptoms concerning for COVID-19 infection (fever, chills, cough, or new shortness of breath).    Past Medical History:  Diagnosis Date  . Anxiety states   . 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 Depressive disorder, not elsewhere classified   . DM (diabetes mellitus) ([place])   . Hemorrhage of rectum and anus   . Hyperplasia of prostate   . Polyp of colon    Past Surgical History:  Procedure Laterality Date  . COLONOSCOPY    . CYST REMOVAL TRUNK    . KNEE SURGERY     LEFT  . POLYPECTOMY       Current Meds  Medication Sig  . aspirin 81 MG EC tablet Take 81 mg by mouth daily.    Marland Kitchen atorvastatin (LIPITOR) 20 MG tablet TAKE 1 TABLET BY MOUTH EVERY DAY  . Blood Glucose Monitoring Suppl (ONETOUCH VERIO) w/Device KIT 1 kit by Does not apply route daily.  . enalapril (VASOTEC) 5 MG tablet TAKE 1 TABLET BY MOUTH EVERY DAY  . Fexofenadine HCl (ALLEGRA PO) Take by mouth.  . fluticasone (FLONASE) 50 MCG/ACT nasal  spray SPRAY 2 SPRAYS INTO EACH NOSTRIL EVERY DAY  . glucose blood (ONETOUCH VERIO) test strip Use as instructed  . glyBURIDE (DIABETA) 2.5 MG tablet TAKE 1 TABLET (2.5 MG TOTAL) BY MOUTH DAILY WITH BREAKFAST.  Marland Kitchen JANUVIA 50 MG tablet TAKE 1 TABLET BY MOUTH EVERY DAY  . omega-3 acid ethyl esters (LOVAZA) 1 g capsule TAKE 2 CAPSULES (2 G TOTAL) BY MOUTH 2 (TWO) TIMES DAILY.  Marland Kitchen ONETOUCH DELICA LANCETS 08X MISC Test BS bid  . Vitamin D, Ergocalciferol, (DRISDOL) 1.25 MG (50000 UT) CAPS capsule TAKE 1 CAPSULE BY MOUTH ONCE A WEEK  . XIGDUO XR 04-999 MG TB24 TAKE 2 TABLETS BY MOUTH EVERY DAY     Allergies:   Sulfonamide derivatives and Erythromycin   Social History   Tobacco Use  . Smoking status: Never Smoker  . Smokeless tobacco: Never Used  Substance Use Topics  . Alcohol use: No  . Drug use:  No     Family Hx: The patient's family history includes Cancer in his maternal aunt; Cancer (age of onset: 1) in his brother; Colon cancer in his paternal uncle; Diabetes in his brother and brother; Heart disease (age of onset: 6) in his father; Stroke in his maternal grandmother.  ROS:   Please see the history of present illness.    As stated in the HPI and negative for all other systems.   Prior CV studies:   The following studies were reviewed today:  Labs, [employer].  Labs/Other Tests and Data Reviewed:    EKG:  No ECG reviewed.  Recent Labs: 02/01/2019: ALT 27; BUN 10; Creatinine, Ser 0.83; Hemoglobin 15.5; Platelets 235; Potassium 4.3; Sodium 141   Recent Lipid Panel Lab Results  Component Value Date/Time   CHOL 99 (L) 02/01/2019 08:25 AM   TRIG 71 02/01/2019 08:25 AM   TRIG 120 08/29/2017 10:18 AM   HDL 35 (L) 02/01/2019 08:25 AM   HDL 42 08/29/2017 10:18 AM   CHOLHDL 2.8 02/01/2019 08:25 AM   LDLCALC 50 02/01/2019 08:25 AM   LDLCALC 83 02/07/2014 09:13 AM    Wt Readings from Last 3 Encounters:  04/12/19 242 lb (109.8 kg)  01/20/19 251 lb (113.9 kg)  09/16/18 250 lb (113.4 kg)     Objective:    Vital Signs:  BP 135/79   Ht '5\' 10"'  (1.778 m)   Wt 242 lb (109.8 kg)   BMI 34.72 kg/m    VITAL SIGNS:  reviewed GEN:  no acute distress RESPIRATORY:  normal respiratory effort, symmetric expansion NEURO:  alert and oriented x 3, no obvious focal deficit PSYCH:  normal affect  ASSESSMENT & PLAN:    PRECORDIAL PAIN (ICD-786.51) His pain is very atypical.  It is unchanged from 2018 when he had a negative Lexiscan Myoview.  Given this I do not think that there is a high pretest probability that he is developing new obstructive disease.  However, he is at high risk for future cardiovascular events but this would not be obviated by testing at this point but rather he needs aggressive primary risk reduction.  HYPERLIPIDEMIA (ICD-272.4) LDL was 50 earlier  this year.  No change in therapy.   OBESITY, UNSPECIFIED (ICD-278.00) We talked about diet.   DM:   His blood sugar is poorly controlled.  He and I talked about the fact that he is at high risk for future cardiovascular events and he needs to get this treated.  ANXIETY ANGER He and I talked about this and he  was given a prescription for Effexor.  He did not want to take it.  I suggested that he should take this per his primary provider's suggestion although he would need to watch his blood pressure.  COVID-19 Education: The signs and symptoms of COVID-19 were discussed with the patient and how to seek care for testing (follow up with PCP or arrange E-visit).  The importance of social distancing was discussed today.  Time:   Today, I have spent 20 minutes with the patient with telehealth technology discussing the above problems.     Medication Adjustments/Labs and Tests Ordered: Current medicines are reviewed at length with the patient today.  Concerns regarding medicines are outlined above.   Tests Ordered: No orders of the defined types were placed in this encounter.   Medication Changes: No orders of the defined types were placed in this encounter.   Disposition:  Follow up prn  Signed, Minus Breeding, MD  04/12/2019 3:13 PM    [facility]

## 2019-04-12 ENCOUNTER — Telehealth (INDEPENDENT_AMBULATORY_CARE_PROVIDER_SITE_OTHER): Payer: BLUE CROSS/BLUE SHIELD | Admitting: Cardiology

## 2019-04-12 ENCOUNTER — Encounter: Payer: Self-pay | Admitting: Cardiology

## 2019-04-12 VITALS — BP 135/79 | Ht 70.0 in | Wt 242.0 lb

## 2019-04-12 DIAGNOSIS — I10 Essential (primary) hypertension: Secondary | ICD-10-CM

## 2019-04-12 DIAGNOSIS — R079 Chest pain, unspecified: Secondary | ICD-10-CM | POA: Diagnosis not present

## 2019-04-12 DIAGNOSIS — Z7189 Other specified counseling: Secondary | ICD-10-CM

## 2019-04-12 NOTE — Patient Instructions (Signed)

## 2019-04-13 ENCOUNTER — Encounter: Payer: Self-pay | Admitting: Cardiology

## 2019-04-13 DIAGNOSIS — Z7189 Other specified counseling: Secondary | ICD-10-CM | POA: Insufficient documentation

## 2019-04-14 ENCOUNTER — Ambulatory Visit: Payer: BLUE CROSS/BLUE SHIELD | Admitting: Cardiology

## 2019-05-26 ENCOUNTER — Ambulatory Visit: Payer: BLUE CROSS/BLUE SHIELD | Admitting: Family Medicine

## 2019-06-01 ENCOUNTER — Other Ambulatory Visit: Payer: Self-pay | Admitting: Family Medicine

## 2019-06-01 MED ORDER — DOXYCYCLINE HYCLATE 100 MG PO TABS: 100.0000 mg | ORAL_TABLET | Freq: Two times a day (BID) | ORAL | 0 refills | Status: DC

## 2019-06-02 ENCOUNTER — Ambulatory Visit: Payer: BLUE CROSS/BLUE SHIELD | Admitting: Family Medicine

## 2019-06-03 ENCOUNTER — Encounter: Payer: Self-pay | Admitting: Gastroenterology

## 2019-06-07 ENCOUNTER — Other Ambulatory Visit: Payer: Self-pay | Admitting: Family Medicine

## 2019-06-07 ENCOUNTER — Telehealth: Payer: Self-pay | Admitting: Gastroenterology

## 2019-06-07 ENCOUNTER — Encounter: Payer: Self-pay | Admitting: Internal Medicine

## 2019-06-07 NOTE — Telephone Encounter (Signed)
Dr. Fuller Plan, this pt requested to transfer care to Dr. Hilarie Fredrickson because his parents are both Dr. Vena Rua patients, and he would like to "keep his mama off his back."  Will you approve the switch?

## 2019-06-07 NOTE — Telephone Encounter (Signed)
Dr. Hilarie Fredrickson, please see below.  Will you accept this pt?

## 2019-06-07 NOTE — Telephone Encounter (Signed)
Okay 

## 2019-06-07 NOTE — Telephone Encounter (Signed)
OK 

## 2019-06-10 ENCOUNTER — Other Ambulatory Visit: Payer: Self-pay

## 2019-06-11 ENCOUNTER — Ambulatory Visit (INDEPENDENT_AMBULATORY_CARE_PROVIDER_SITE_OTHER): Payer: BC Managed Care – PPO | Admitting: Family Medicine

## 2019-06-11 ENCOUNTER — Encounter: Payer: Self-pay | Admitting: Family Medicine

## 2019-06-11 VITALS — BP 136/88 | HR 81 | Temp 97.7°F | Ht 70.0 in | Wt 242.0 lb

## 2019-06-11 DIAGNOSIS — E1159 Type 2 diabetes mellitus with other circulatory complications: Secondary | ICD-10-CM

## 2019-06-11 DIAGNOSIS — E1169 Type 2 diabetes mellitus with other specified complication: Secondary | ICD-10-CM | POA: Diagnosis not present

## 2019-06-11 DIAGNOSIS — I10 Essential (primary) hypertension: Secondary | ICD-10-CM

## 2019-06-11 DIAGNOSIS — E785 Hyperlipidemia, unspecified: Secondary | ICD-10-CM

## 2019-06-11 DIAGNOSIS — E119 Type 2 diabetes mellitus without complications: Secondary | ICD-10-CM | POA: Diagnosis not present

## 2019-06-11 LAB — BAYER DCA HB A1C WAIVED: HB A1C (BAYER DCA - WAIVED): 7.1 % — ABNORMAL HIGH (ref <7.0)

## 2019-06-11 MED ORDER — ENALAPRIL MALEATE 5 MG PO TABS: 5.0000 mg | ORAL_TABLET | Freq: Every day | ORAL | 1 refills | Status: DC

## 2019-06-11 MED ORDER — XIGDUO XR 5-1000 MG PO TB24: 2.0000 | ORAL_TABLET | Freq: Every day | ORAL | 0 refills | Status: DC

## 2019-06-11 MED ORDER — GLYBURIDE 2.5 MG PO TABS: 2.5000 mg | ORAL_TABLET | Freq: Every day | ORAL | 0 refills | Status: DC

## 2019-06-11 MED ORDER — ATORVASTATIN CALCIUM 20 MG PO TABS: 20.0000 mg | ORAL_TABLET | Freq: Every day | ORAL | 0 refills | Status: DC

## 2019-06-11 MED ORDER — OMEGA-3-ACID ETHYL ESTERS 1 G PO CAPS: 1.0000 g | ORAL_CAPSULE | Freq: Two times a day (BID) | ORAL | 3 refills | Status: DC

## 2019-06-11 NOTE — Patient Instructions (Signed)
Have your specialists send me copies of your exam results.   Diabetes Mellitus and Nutrition, Adult When you have diabetes (diabetes mellitus), it is very important to have healthy eating habits because your blood sugar (glucose) levels are greatly affected by what you eat and drink. Eating healthy foods in the appropriate amounts, at about the same times every day, can help you:  Control your blood glucose.  Lower your risk of heart disease.  Improve your blood pressure.  Reach or maintain a healthy weight. Every person with diabetes is different, and each person has different needs for a meal plan. Your health care provider may recommend that you work with a diet and nutrition specialist (dietitian) to make a meal plan that is best for you. Your meal plan may vary depending on factors such as:  The calories you need.  The medicines you take.  Your weight.  Your blood glucose, blood pressure, and cholesterol levels.  Your activity level.  Other health conditions you have, such as heart or kidney disease. How do carbohydrates affect me? Carbohydrates, also called carbs, affect your blood glucose level more than any other type of food. Eating carbs naturally raises the amount of glucose in your blood. Carb counting is a method for keeping track of how many carbs you eat. Counting carbs is important to keep your blood glucose at a healthy level, especially if you use insulin or take certain oral diabetes medicines. It is important to know how many carbs you can safely have in each meal. This is different for every person. Your dietitian can help you calculate how many carbs you should have at each meal and for each snack. Foods that contain carbs include:  Bread, cereal, rice, pasta, and crackers.  Potatoes and corn.  Peas, beans, and lentils.  Milk and yogurt.  Fruit and juice.  Desserts, such as cakes, cookies, ice cream, and candy. How does alcohol affect me? Alcohol can  cause a sudden decrease in blood glucose (hypoglycemia), especially if you use insulin or take certain oral diabetes medicines. Hypoglycemia can be a life-threatening condition. Symptoms of hypoglycemia (sleepiness, dizziness, and confusion) are similar to symptoms of having too much alcohol. If your health care provider says that alcohol is safe for you, follow these guidelines:  Limit alcohol intake to no more than 1 drink per day for nonpregnant women and 2 drinks per day for men. One drink equals 12 oz of beer, 5 oz of wine, or 1 oz of hard liquor.  Do not drink on an empty stomach.  Keep yourself hydrated with water, diet soda, or unsweetened iced tea.  Keep in mind that regular soda, juice, and other mixers may contain a lot of sugar and must be counted as carbs. What are tips for following this plan?  Reading food labels  Start by checking the serving size on the "Nutrition Facts" label of packaged foods and drinks. The amount of calories, carbs, fats, and other nutrients listed on the label is based on one serving of the item. Many items contain more than one serving per package.  Check the total grams (g) of carbs in one serving. You can calculate the number of servings of carbs in one serving by dividing the total carbs by 15. For example, if a food has 30 g of total carbs, it would be equal to 2 servings of carbs.  Check the number of grams (g) of saturated and trans fats in one serving. Choose foods that have  low or no amount of these fats.  Check the number of milligrams (mg) of salt (sodium) in one serving. Most people should limit total sodium intake to less than 2,300 mg per day.  Always check the nutrition information of foods labeled as "low-fat" or "nonfat". These foods may be higher in added sugar or refined carbs and should be avoided.  Talk to your dietitian to identify your daily goals for nutrients listed on the label. Shopping  Avoid buying canned, premade, or  processed foods. These foods tend to be high in fat, sodium, and added sugar.  Shop around the outside edge of the grocery store. This includes fresh fruits and vegetables, bulk grains, fresh meats, and fresh dairy. Cooking  Use low-heat cooking methods, such as baking, instead of high-heat cooking methods like deep frying.  Cook using healthy oils, such as olive, canola, or sunflower oil.  Avoid cooking with butter, cream, or high-fat meats. Meal planning  Eat meals and snacks regularly, preferably at the same times every day. Avoid going long periods of time without eating.  Eat foods high in fiber, such as fresh fruits, vegetables, beans, and whole grains. Talk to your dietitian about how many servings of carbs you can eat at each meal.  Eat 4-6 ounces (oz) of lean protein each day, such as lean meat, chicken, fish, eggs, or tofu. One oz of lean protein is equal to: ? 1 oz of meat, chicken, or fish. ? 1 egg. ?  cup of tofu.  Eat some foods each day that contain healthy fats, such as avocado, nuts, seeds, and fish. Lifestyle  Check your blood glucose regularly.  Exercise regularly as told by your health care provider. This may include: ? 150 minutes of moderate-intensity or vigorous-intensity exercise each week. This could be brisk walking, biking, or water aerobics. ? Stretching and doing strength exercises, such as yoga or weightlifting, at least 2 times a week.  Take medicines as told by your health care provider.  Do not use any products that contain nicotine or tobacco, such as cigarettes and e-cigarettes. If you need help quitting, ask your health care provider.  Work with a Social worker or diabetes educator to identify strategies to manage stress and any emotional and social challenges. Questions to ask a health care provider  Do I need to meet with a diabetes educator?  Do I need to meet with a dietitian?  What number can I call if I have questions?  When are the  best times to check my blood glucose? Where to find more information:  American Diabetes Association: diabetes.org  Academy of Nutrition and Dietetics: www.eatright.[employer] of Diabetes and Digestive and Kidney Diseases (NIH): [employer] Summary  A healthy meal plan will help you control your blood glucose and maintain a healthy lifestyle.  Working with a diet and nutrition specialist (dietitian) can help you make a meal plan that is best for you.  Keep in mind that carbohydrates (carbs) and alcohol have immediate effects on your blood glucose levels. It is important to count carbs and to use alcohol carefully. This information is not intended to replace advice given to you by your health care provider. Make sure you discuss any questions you have with your health care provider. Document Released: 08/29/2005 Document Revised: 11/14/2017 Document Reviewed: 01/06/2017 Elsevier Patient Education  2020 [employer].

## 2019-06-11 NOTE — Progress Notes (Signed)
Subjective: CC: DM, est care PCP: Janora Norlander, DO DZH:[id number] F Treyvon Blahut. is a 68 y.o. male presenting to clinic today for:  1. Type 2 Diabetes:  Patient reports that he does not routinely check his blood sugar at home.  Taking medication(s): Zigduo 5-1065m, glyburide 2.5 mg, Januvia 50 mg, Lipitor 20 mg, enalapril 5 mg and he also takes Lovaza 1 capsule twice daily.  2 capsules twice daily caused diarrhea.  He does report some weight loss that is intentional.  He does admit to frequently eating out and not really restricting his diet.  He drinks sodas but tries to drink diet sodas.  [facility] Last foot exam: UTD Last A1c:  Lab Results  Component Value Date   HGBA1C 8.7 (H) 02/01/2019   Nephropathy screen indicated?: on ACE-I Last flu, zoster and/or pneumovax: PNA vaccine needed Immunization History  Administered Date(s) Administered  . Influenza, High Dose Seasonal PF 09/09/2018  . Influenza-Unspecified 10/16/2016  . Pneumococcal Conjugate-13 10/08/2012  . Tdap 06/15/2009  . Zoster 02/08/2013    ROS: Does not endorse CP, SOB, edema.   ROS: Per HPI  Allergies  Allergen Reactions  . Sulfonamide Derivatives Swelling    SWELLING OF TONGUE  . Erythromycin Other (See Comments)    Patient unsure reaction   Past Medical History:  Diagnosis Date  . Anxiety states   . 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 Depressive disorder, not elsewhere classified   . [place]di[place] mellitus) [place]   . Hemorrhage of rectum and anus   . Hyperplasia of prostate   . Polyp of colon     Current Outpatient Medications:  .  aspirin 81 MG EC tablet, Take 81 mg by mouth daily.  , Disp: , Rfl:  .  atorvastatin (LIPITOR) 20 MG tablet, TAKE 1 TABLET BY MOUTH EVERY DAY, Disp: 90 tablet, Rfl: 0 .  Blood Glucose Monitoring Suppl (ONETOUCH VERIO) w/Device KIT, 1 kit by Does not apply route daily., Disp: 1 kit, Rfl: 0 .  doxycycline  (VIBRA-TABS) 100 MG tablet, Take 1 tablet (100 mg total) by mouth 2 (two) times daily., Disp: 42 tablet, Rfl: 0 .  enalapril (VASOTEC) 5 MG tablet, TAKE 1 TABLET BY MOUTH EVERY DAY, Disp: 90 tablet, Rfl: 1 .  fluticasone (FLONASE) 50 MCG/ACT nasal spray, SPRAY 2 SPRAYS INTO EACH NOSTRIL EVERY DAY, Disp: 16 g, Rfl: 11 .  glucose blood (ONETOUCH VERIO) test strip, Use as instructed, Disp: 100 each, Rfl: 12 .  glyBURIDE (DIABETA) 2.5 MG tablet, TAKE 1 TABLET (2.5 MG TOTAL) BY MOUTH DAILY WITH BREAKFAST., Disp: 90 tablet, Rfl: 0 .  JANUVIA 50 MG tablet, TAKE 1 TABLET BY MOUTH EVERY DAY, Disp: 90 tablet, Rfl: 0 .  ONETOUCH DELICA LANCETS 383MMISC, Test BS bid, Disp: 100 each, Rfl: 2 .  venlafaxine (EFFEXOR) 37.5 MG tablet, Take 37.5 mg by mouth daily., Disp: , Rfl:  .  Vitamin D, Ergocalciferol, (DRISDOL) 1.25 MG (50000 UT) CAPS capsule, TAKE 1 CAPSULE BY MOUTH ONE TIME PER WEEK, Disp: 12 capsule, Rfl: 3 .  XIGDUO XR 04-999 MG TB24, TAKE 2 TABLETS BY MOUTH EVERY DAY, Disp: 180 tablet, Rfl: 0 Social History   Socioeconomic History  . Marital status: Married    Spouse name: Not on file  . Number of children: Not on file  . Years of education: Not on file  . Highest education level: Not on file  Occupational History  .  Not on file  Social Needs  . Financial resource strain: Not on file  . Food insecurity    Worry: Not on file    Inability: Not on file  . Transportation needs    Medical: Not on file    Non-medical: Not on file  Tobacco Use  . Smoking status: Never Smoker  . Smokeless tobacco: Never Used  Substance and Sexual Activity  . Alcohol use: No  . Drug use: No  . Sexual activity: Not on file  Lifestyle  . Physical activity    Days per week: Not on file    Minutes per session: Not on file  . Stress: Not on file  Relationships  . Social Herbalist on phone: Not on file    Gets together: Not on file    Attends religious service: Not on file    Active member of club  or organization: Not on file    Attends meetings of clubs or organizations: Not on file    Relationship status: Not on file  . Intimate partner violence    Fear of current or ex partner: Not on file    Emotionally abused: Not on file    Physically abused: Not on file    Forced sexual activity: Not on file  Other Topics Concern  . Not on file  Social History Narrative  . Not on file   Family History  Problem Relation Age of Onset  . Heart disease Father 67       CABG  . Diabetes Brother   . Cancer Brother 76       brain tumor  . Colon cancer Paternal Uncle   . Stroke Maternal Grandmother   . Cancer Maternal Aunt        brain cancer  . Diabetes Brother     Objective: Office vital signs reviewed. BP 136/88   Pulse 81   Temp 97.7 F (36.5 C) (Oral)   Ht 5' 10" (1.778 m)   Wt 242 lb (109.8 kg)   BMI 34.72 kg/m   Physical Examination:  General: Awake, alert, well appearing, obese. No acute distress HEENT: Normal, sclera white, MMM Cardio: regular rate and rhythm, S1S2 heard, no murmurs appreciated Pulm: clear to auscultation bilaterally, no wheezes, rhonchi or rales; normal work of breathing on room air Extremities: warm, well perfused, No edema, cyanosis or clubbing; +2 pulses bilaterally Psych: Mood stable, speech normal, affect appropriate  Assessment/ Plan: 68 y.o. male   1. Type 2 diabetes mellitus without complication, without long-term current use of insulin (HCC) A1c quite a bit improved from previous visit at 7.1 today.  Because he is so close to goal I will leave his medication regimen as is.  If we need to adjust we may consider increasing Januvia to 100 mg daily.  Alternatively we could consider increasing the glyburide.  He will follow-up in 3 months for interval checkup. - Bayer DCA Hb A1c Waived  2. Hypertension associated with diabetes ([place]) Continue current regimen with enalapril  3. Hyperlipidemia associated with type 2 diabetes mellitus (HCC)  Continue statin.  I have renewed the Lovaza  4. Morbid obesity ([place]) We did discuss modification of diet during today's visit.  Though I think he is more pre-contemplative versus contemplative about this.   Orders Placed This Encounter  Procedures  . Bayer DCA Hb A1c Waived   No orders of the defined types were placed in this encounter.    Janora Norlander,  DO [place] [phone]

## 2019-06-15 ENCOUNTER — Other Ambulatory Visit: Payer: Self-pay | Admitting: Family Medicine

## 2019-06-20 ENCOUNTER — Other Ambulatory Visit: Payer: Self-pay | Admitting: Family Medicine

## 2019-06-22 LAB — HM DIABETES EYE EXAM

## 2019-07-02 ENCOUNTER — Other Ambulatory Visit: Payer: Self-pay

## 2019-07-02 ENCOUNTER — Ambulatory Visit (AMBULATORY_SURGERY_CENTER): Payer: Self-pay | Admitting: *Deleted

## 2019-07-02 VITALS — Ht 70.0 in | Wt 235.0 lb

## 2019-07-02 DIAGNOSIS — Z86010 Personal history of colonic polyps: Secondary | ICD-10-CM

## 2019-07-02 MED ORDER — SUPREP BOWEL PREP KIT 17.5-3.13-1.6 GM/177ML PO SOLN: ORAL | 0 refills | Status: DC

## 2019-07-02 NOTE — Progress Notes (Signed)
Pt's previsit is done over the phone and all paperwork (prep instructions, blank consent form to just read over, pre-procedure acknowledgement form and stamped envelope) sent to patient  Pt is aware that care partner will wait in the car during procedure; if they feel like they will be too hot to wait in the car; they may wait in the lobby.  We want them to wear a mask (we do not have any that we can provide them), practice social distancing, and we will check their temperatures when they get here.  I did remind patient that their care partner needs to stay in the parking lot the entire time. Pt will wear mask into building.   No egg or soy allergy  No home oxygen used or hx of sleep apnea  Registered in emmi  No diet medications taken

## 2019-07-03 ENCOUNTER — Other Ambulatory Visit: Payer: Self-pay | Admitting: Family Medicine

## 2019-07-12 ENCOUNTER — Other Ambulatory Visit: Payer: Self-pay | Admitting: Family Medicine

## 2019-07-12 NOTE — Telephone Encounter (Signed)
What is the name of the medication? sildenafil (REVATIO) 20 MG tablet    Have you contacted your pharmacy to request a refill? yes  Which pharmacy would you like this sent to? [facility]   Patient notified that their request is being sent to the clinical staff for review and that they should receive a call once it is complete. If they do not receive a call within 24 hours they can check with their pharmacy or our office.

## 2019-07-14 ENCOUNTER — Telehealth: Payer: Self-pay | Admitting: Internal Medicine

## 2019-07-14 NOTE — Telephone Encounter (Signed)

## 2019-07-15 ENCOUNTER — Ambulatory Visit (AMBULATORY_SURGERY_CENTER): Payer: BC Managed Care – PPO | Admitting: Internal Medicine

## 2019-07-15 ENCOUNTER — Encounter: Payer: Self-pay | Admitting: Internal Medicine

## 2019-07-15 ENCOUNTER — Other Ambulatory Visit: Payer: Self-pay

## 2019-07-15 VITALS — BP 116/73 | HR 75 | Temp 98.8°F | Resp 22 | Ht 70.0 in | Wt 235.0 lb

## 2019-07-15 DIAGNOSIS — Z1211 Encounter for screening for malignant neoplasm of colon: Secondary | ICD-10-CM | POA: Diagnosis not present

## 2019-07-15 DIAGNOSIS — K635 Polyp of colon: Secondary | ICD-10-CM | POA: Diagnosis not present

## 2019-07-15 DIAGNOSIS — D123 Benign neoplasm of transverse colon: Secondary | ICD-10-CM | POA: Diagnosis not present

## 2019-07-15 DIAGNOSIS — D125 Benign neoplasm of sigmoid colon: Secondary | ICD-10-CM

## 2019-07-15 DIAGNOSIS — Z86010 Personal history of colonic polyps: Secondary | ICD-10-CM

## 2019-07-15 MED ORDER — SODIUM CHLORIDE 0.9 % IV SOLN: 500.0000 mL | Freq: Once | INTRAVENOUS | Status: DC

## 2019-07-15 NOTE — Patient Instructions (Signed)
Read all of the handouts given to you by your recovery room nurse.  YOU HAD AN ENDOSCOPIC PROCEDURE TODAY AT [facility]:   Refer to the procedure report that was given to you for any specific questions about what was found during the examination.  If the procedure report does not answer your questions, please call your gastroenterologist to clarify.  If you requested that your care partner not be given the details of your procedure findings, then the procedure report has been included in a sealed envelope for you to review at your convenience later.  YOU SHOULD EXPECT: Some feelings of bloating in the abdomen. Passage of more gas than usual.  Walking can help get rid of the air that was put into your GI tract during the procedure and reduce the bloating. If you had a lower endoscopy (such as a colonoscopy or flexible sigmoidoscopy) you may notice spotting of blood in your stool or on the toilet paper. If you underwent a bowel prep for your procedure, you may not have a normal bowel movement for a few days.  Please Note:  You might notice some irritation and congestion in your nose or some drainage.  This is from the oxygen used during your procedure.  There is no need for concern and it should clear up in a day or so.  SYMPTOMS TO REPORT IMMEDIATELY:   Following lower endoscopy (colonoscopy or flexible sigmoidoscopy):  Excessive amounts of blood in the stool  Significant tenderness or worsening of abdominal pains  Swelling of the abdomen that is new, acute  Fever of 100F or higher   For urgent or emergent issues, a gastroenterologist can be reached at any hour by calling [phone].   DIET:  We do recommend a small meal at first, but then you may proceed to your regular diet.  Drink plenty of fluids but you should avoid alcoholic beverages for 24 hours. Try to increase the fiber in your diet, and drink plenty of water.  ACTIVITY:  You should plan to take it easy for the  rest of today and you should NOT DRIVE or use heavy machinery until tomorrow (because of the sedation medicines used during the test).    FOLLOW UP: Our staff will call the number listed on your records 48-72 hours following your procedure to check on you and address any questions or concerns that you may have regarding the information given to you following your procedure. If we do not reach you, we will leave a message.  We will attempt to reach you two times.  During this call, we will ask if you have developed any symptoms of COVID 19. If you develop any symptoms (ie: fever, flu-like symptoms, shortness of breath, cough etc.) before then, please call [phone].  If you test positive for Covid 19 in the 2 weeks post procedure, please call and report this information to [place].    If any biopsies were taken you will be contacted by phone or by letter within the next 1-3 weeks.  Please call us at [phone] if you have not heard about the biopsies in 3 weeks.    SIGNATURES/CONFIDENTIALITY: You and/or your care partner have signed paperwork which will be entered into your electronic medical record.  These signatures attest to the fact that that the information above on your After Visit Summary has been reviewed and is understood.  Full responsibility of the confidentiality of this discharge information lies with you and/or your care-partner.

## 2019-07-15 NOTE — Progress Notes (Signed)
Report given to PACU, vss 

## 2019-07-15 NOTE — Addendum Note (Signed)
Addended by: Ernestine Conrad D on: 07/15/2019 04:45 PM   Modules accepted: Orders

## 2019-07-15 NOTE — Op Note (Signed)
[facility] Patient Name: Richard Nelson Procedure Date: 07/15/2019 9:48 AM MRN: [id number] Endoscopist: Jerene Bears , MD Age: 68 Referring MD:  Date of Birth: [birth year] Gender: Male Account #: [id number] Procedure:                Colonoscopy Indications:              High risk colon cancer surveillance: Personal                            history of non-advanced adenoma, Last colonoscopy:                            July 2014 Medicines:                Monitored Anesthesia Care Procedure:                Pre-Anesthesia Assessment:                           - Prior to the procedure, a History and Physical                            was performed, and patient medications and                            allergies were reviewed. The patient's tolerance of                            previous anesthesia was also reviewed. The risks                            and benefits of the procedure and the sedation                            options and risks were discussed with the patient.                            All questions were answered, and informed consent                            was obtained. Prior Anticoagulants: The patient has                            taken no previous anticoagulant or antiplatelet                            agents. ASA Grade Assessment: II - A patient with                            mild systemic disease. After reviewing the risks                            and benefits, the patient was deemed in  satisfactory condition to undergo the procedure.                           After obtaining informed consent, the colonoscope                            was passed under direct vision. Throughout the                            procedure, the patient's blood pressure, pulse, and                            oxygen saturations were monitored continuously. The                            Colonoscope was introduced through the anus and                       advanced to the cecum, identified by appendiceal                            orifice and ileocecal valve. The colonoscopy was                            performed without difficulty. The patient tolerated                            the procedure well. The quality of the bowel                            preparation was good. The ileocecal valve,                            appendiceal orifice, and rectum were photographed. Scope In: 9:57:27 AM Scope Out: 10:11:13 AM Scope Withdrawal Time: 0 hours 12 minutes 35 seconds  Total Procedure Duration: 0 hours 13 minutes 46 seconds  Findings:                 The digital rectal exam was normal.                           Three sessile polyps were found in the transverse                            colon. The polyps were 3 to 5 mm in size. These                            polyps were removed with a cold snare. Resection                            and retrieval were complete.                           A 4 mm polyp was found in the sigmoid colon. The  polyp was sessile. The polyp was removed with a                            cold snare. Resection and retrieval were complete.                           Multiple small and large-mouthed diverticula were                            found in the sigmoid colon, descending colon and                            ascending colon.                           Internal hemorrhoids were found during retroflexion. Complications:            No immediate complications. Estimated Blood Loss:     Estimated blood loss was minimal. Impression:               - Three 3 to 5 mm polyps in the transverse colon,                            removed with a cold snare. Resected and retrieved.                           - One 4 mm polyp in the sigmoid colon, removed with                            a cold snare. Resected and retrieved.                           - Diverticulosis in the sigmoid  colon, in the                            descending colon and in the ascending colon.                           - Internal hemorrhoids. Recommendation:           - Patient has a contact number available for                            emergencies. The signs and symptoms of potential                            delayed complications were discussed with the                            patient. Return to normal activities tomorrow.                            Written discharge instructions were provided to the  patient.                           - Resume previous diet.                           - Continue present medications.                           - Await pathology results.                           - Repeat colonoscopy is recommended for                            surveillance. The colonoscopy date will be                            determined after pathology results from today's                            exam become available for review. Jerene Bears, MD 07/15/2019 10:14:30 AM This report has been signed electronically.

## 2019-07-15 NOTE — Progress Notes (Signed)
Called to room to assist during endoscopic procedure.  Patient ID and intended procedure confirmed with present staff. Received instructions for my participation in the procedure from the performing physician.  

## 2019-07-16 ENCOUNTER — Encounter: Payer: BLUE CROSS/BLUE SHIELD | Admitting: Gastroenterology

## 2019-07-19 ENCOUNTER — Telehealth: Payer: Self-pay

## 2019-07-19 NOTE — Telephone Encounter (Signed)
  Follow up Call-  Call back number 07/15/2019  Post procedure Call Back phone  # [phone]  Permission to leave phone message Yes  Some recent data might be hidden     Patient questions:  Do you have a fever, pain , or abdominal swelling? No. Pain Score  0 *  Have you tolerated food without any problems? Yes.    Have you been able to return to your normal activities? Yes.    Do you have any questions about your discharge instructions: Diet   No. Medications  No. Follow up visit  No.  Do you have questions or concerns about your Care? No.  Actions: * If pain score is 4 or above: No action needed, pain <4. 1. Have you developed a fever since your procedure? no  2.   Have you had an respiratory symptoms (SOB or cough) since your procedure? no  3.   Have you tested positive for COVID 19 since your procedure no  4.   Have you had any family members/close contacts diagnosed with the COVID 19 since your procedure?  no   If yes to any of these questions please route to Joylene John, RN and Alphonsa Gin, Therapist, sports.

## 2019-07-22 ENCOUNTER — Encounter: Payer: Self-pay | Admitting: Internal Medicine

## 2019-07-26 ENCOUNTER — Telehealth: Payer: Self-pay | Admitting: Family Medicine

## 2019-07-26 ENCOUNTER — Other Ambulatory Visit: Payer: Self-pay | Admitting: Family Medicine

## 2019-07-26 ENCOUNTER — Encounter: Payer: Self-pay | Admitting: Family Medicine

## 2019-07-26 DIAGNOSIS — N529 Male erectile dysfunction, unspecified: Secondary | ICD-10-CM

## 2019-07-26 NOTE — Telephone Encounter (Signed)
Patient states that Sildenafil is not working.  Patient has take 2 tablets and 5 tablets

## 2019-07-26 NOTE — Telephone Encounter (Signed)
I just responded to him via [place].

## 2019-07-27 ENCOUNTER — Other Ambulatory Visit: Payer: Self-pay | Admitting: Family Medicine

## 2019-08-09 ENCOUNTER — Encounter: Payer: Self-pay | Admitting: Family Medicine

## 2019-08-30 ENCOUNTER — Other Ambulatory Visit: Payer: Self-pay | Admitting: Family Medicine

## 2019-09-04 ENCOUNTER — Other Ambulatory Visit: Payer: Self-pay | Admitting: Family Medicine

## 2019-09-04 DIAGNOSIS — E1169 Type 2 diabetes mellitus with other specified complication: Secondary | ICD-10-CM

## 2019-09-04 DIAGNOSIS — E785 Hyperlipidemia, unspecified: Secondary | ICD-10-CM

## 2019-09-07 ENCOUNTER — Telehealth: Payer: Self-pay | Admitting: *Deleted

## 2019-09-07 NOTE — Telephone Encounter (Signed)
Prior Auth for Lovaza 1gram caps-In Process  Key: [facility] -   PA Case ID: [id number]   Your information has been submitted to [place]. To check for an updated outcome later, reopen this PA request from your dashboard.  If [employer] has not responded to your request within 24 hours, contact [place] at [phone]. If you think there may be a problem with your PA request, use our live chat feature at the bottom right.

## 2019-09-08 NOTE — Telephone Encounter (Signed)
Please inform patient.  Ok to continue only atorvastatin for now.  Plan to repeat fasting lipid panel at next visit to ensure no significant rise after discontinuation of lovaza.

## 2019-09-08 NOTE — Telephone Encounter (Signed)
Prior Auth for Lovaza 1 gram caps-DENIED  Didn't meet criteria of having triglyceride greater than or equal to 500 prior to starting triglyceride lowering medication.  For Provider Courtesy Review call [phone]

## 2019-09-13 NOTE — Telephone Encounter (Signed)
Pt aware of provider feedback and voiced understanding. 

## 2019-09-17 ENCOUNTER — Encounter: Payer: Self-pay | Admitting: Family Medicine

## 2019-09-26 ENCOUNTER — Other Ambulatory Visit: Payer: Self-pay | Admitting: Family Medicine

## 2019-09-26 DIAGNOSIS — E119 Type 2 diabetes mellitus without complications: Secondary | ICD-10-CM

## 2019-10-08 ENCOUNTER — Telehealth: Payer: Self-pay | Admitting: Family Medicine

## 2019-10-11 ENCOUNTER — Other Ambulatory Visit: Payer: Self-pay

## 2019-10-11 ENCOUNTER — Encounter: Payer: Self-pay | Admitting: Family Medicine

## 2019-10-11 ENCOUNTER — Ambulatory Visit: Payer: BC Managed Care – PPO | Admitting: Family Medicine

## 2019-10-11 ENCOUNTER — Ambulatory Visit (INDEPENDENT_AMBULATORY_CARE_PROVIDER_SITE_OTHER): Payer: BC Managed Care – PPO | Admitting: Family Medicine

## 2019-10-11 VITALS — BP 137/88 | HR 89 | Temp 98.0°F | Ht 70.0 in | Wt 236.8 lb

## 2019-10-11 DIAGNOSIS — N529 Male erectile dysfunction, unspecified: Secondary | ICD-10-CM

## 2019-10-11 DIAGNOSIS — R7989 Other specified abnormal findings of blood chemistry: Secondary | ICD-10-CM

## 2019-10-11 DIAGNOSIS — E1169 Type 2 diabetes mellitus with other specified complication: Secondary | ICD-10-CM

## 2019-10-11 DIAGNOSIS — E1159 Type 2 diabetes mellitus with other circulatory complications: Secondary | ICD-10-CM | POA: Diagnosis not present

## 2019-10-11 DIAGNOSIS — Z23 Encounter for immunization: Secondary | ICD-10-CM

## 2019-10-11 DIAGNOSIS — N400 Enlarged prostate without lower urinary tract symptoms: Secondary | ICD-10-CM

## 2019-10-11 DIAGNOSIS — R4589 Other symptoms and signs involving emotional state: Secondary | ICD-10-CM

## 2019-10-11 DIAGNOSIS — I10 Essential (primary) hypertension: Secondary | ICD-10-CM

## 2019-10-11 DIAGNOSIS — E785 Hyperlipidemia, unspecified: Secondary | ICD-10-CM

## 2019-10-11 DIAGNOSIS — E119 Type 2 diabetes mellitus without complications: Secondary | ICD-10-CM

## 2019-10-11 LAB — BAYER DCA HB A1C WAIVED: HB A1C (BAYER DCA - WAIVED): 6.9 % (ref <7.0)

## 2019-10-11 MED ORDER — SILDENAFIL CITRATE 20 MG PO TABS: ORAL_TABLET | ORAL | 0 refills | Status: DC

## 2019-10-11 MED ORDER — OMEGA-3-ACID ETHYL ESTERS 1 G PO CAPS: 1.0000 g | ORAL_CAPSULE | Freq: Two times a day (BID) | ORAL | 3 refills | Status: DC

## 2019-10-11 MED ORDER — GLYBURIDE 2.5 MG PO TABS: 2.5000 mg | ORAL_TABLET | Freq: Every day | ORAL | 0 refills | Status: DC

## 2019-10-11 MED ORDER — VENLAFAXINE HCL ER 75 MG PO CP24: 75.0000 mg | ORAL_CAPSULE | Freq: Every day | ORAL | 0 refills | Status: DC

## 2019-10-11 NOTE — Patient Instructions (Addendum)
Sugar is back under control.  A1c 6.9 today.  Keep up the good work  You've lost over 8lbs since last visit.  Nice job!  I will see if Loma Sousa, our referral coordinator, can reach out to [facility] about the testosterone.  We can certainly check your levels here if you'd like.  You'd need to come in as soon as the lab opens at 8am to have that done.  Your insurance would no longer cover the lovaza.  I'm checking your cholesterol to insure no significant rise in levels.

## 2019-10-11 NOTE — Progress Notes (Signed)
Subjective: CC: DM w/ HTN, HLD PCP: Richard Norlander, DO FBP:ZWCHENI Richard Nelson. is a 68 y.o. male presenting to clinic today for:  1. Type 2 Diabetes w/ HTN, HLD:  Patient has been working on diet modification and reduction of carbohydrates.  He continues to drink soda but does replace a lot of his carbohydrate rich foods with apples.  He has had successful weight loss with this routine.  Taking medication(s): Zigduo 5-102m, glyburide 2.5 mg, Januvia 50 mg, Lipitor 20 mg, enalapril 5 mg and he also takes Lovaza was denied by insurance.  Last eye exam: Up-to-date Last foot exam: Needs Last A1c:  Lab Results  Component Value Date   HGBA1C 7.1 (H) 06/11/2019   Nephropathy screen indicated?: on ACE-I Last flu, zoster and/or pneumovax: PNA, flu vaccine needed Immunization History  Administered Date(s) Administered  . Influenza Inj Mdck Quad Pf 10/06/2017  . Influenza, High Dose Seasonal PF 09/09/2018, 08/29/2019  . Influenza-Unspecified 10/16/2016  . Pneumococcal Conjugate-13 10/08/2012  . Tdap 06/15/2009  . Zoster 02/08/2013    ROS: No chest pain, shortness of breath, edema, dizziness or falls.  2.  Erectile dysfunction, low testosterone Patient does report intermittent fatigue, difficulty achieving and maintaining an erection.  He tried the Viagra but did not feel that it was especially helpful.  He did not try the max dose of 100 mg though.  He does report some anxiety and depression with moodiness.  He becomes easily irritable and wonders if this is impacting his sex drive.  He has not yet heard from the urologist with regards to an appointment.  He has history of low testosterone and was previously treated with topical testosterone therapy but notes that he did not find it especially helpful.  He does admit to not using it regularly and perhaps this is why.   ROS: Per HPI  Allergies  Allergen Reactions  . Sulfonamide Derivatives Swelling    SWELLING OF TONGUE  .  Erythromycin Other (See Comments)    Patient unsure reaction   Past Medical History:  Diagnosis Date  . Allergy   . Anxiety states   . 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 Depressive disorder, not elsewhere classified   . DM (diabetes mellitus) ([place]   . Hemorrhage of rectum and anus   . Hyperlipidemia   . Hyperplasia of prostate   . Hypertension   . Polyp of colon     Current Outpatient Medications:  .  aspirin 81 MG EC tablet, Take 81 mg by mouth daily.  , Disp: , Rfl:  .  atorvastatin (LIPITOR) 20 MG tablet, TAKE 1 TABLET BY MOUTH EVERY DAY, Disp: 90 tablet, Rfl: 0 .  Blood Glucose Monitoring Suppl (ONETOUCH VERIO) w/Device KIT, 1 kit by Does not apply route daily., Disp: 1 kit, Rfl: 0 .  doxycycline (VIBRA-TABS) 100 MG tablet, Take 1 tablet (100 mg total) by mouth 2 (two) times daily., Disp: 42 tablet, Rfl: 0 .  enalapril (VASOTEC) 5 MG tablet, Take 1 tablet (5 mg total) by mouth daily., Disp: 90 tablet, Rfl: 1 .  fluticasone (FLONASE) 50 MCG/ACT nasal spray, SPRAY 2 SPRAYS INTO EACH NOSTRIL EVERY DAY, Disp: 48 mL, Rfl: 1 .  glucose blood (ONETOUCH VERIO) test strip, Use as instructed, Disp: 100 each, Rfl: 12 .  glyBURIDE (DIABETA) 2.5 MG tablet, Take 1 tablet (2.5 mg total) by mouth daily with breakfast., Disp: 90 tablet, Rfl: 0 .  JANUVIA 50 MG tablet, TAKE 1 TABLET  BY MOUTH EVERY DAY, Disp: 90 tablet, Rfl: 0 .  omega-3 acid ethyl esters (LOVAZA) 1 g capsule, Take 1 capsule (1 g total) by mouth 2 (two) times daily., Disp: 180 capsule, Rfl: 3 .  ONETOUCH DELICA LANCETS 77O MISC, Test BS bid, Disp: 100 each, Rfl: 2 .  sildenafil (REVATIO) 20 MG tablet, TAKE 2-5 TABLETS AS NEEDED PRIOR TO SEXUAL ACTIVITY, Disp: 50 tablet, Rfl: 0 .  venlafaxine XR (EFFEXOR-XR) 37.5 MG 24 hr capsule, TAKE 1 CAPSULE (37.5 MG TOTAL) BY MOUTH DAILY WITH BREAKFAST., Disp: 90 capsule, Rfl: 0 .  Vitamin D, Ergocalciferol, (DRISDOL) 1.25 MG (50000 UT) CAPS capsule, TAKE 1 CAPSULE BY MOUTH ONE TIME  PER WEEK, Disp: 12 capsule, Rfl: 3 .  XIGDUO XR 04-999 MG TB24, TAKE 2 TABLETS BY MOUTH EVERY DAY, Disp: 180 tablet, Rfl: 0 Social History   Socioeconomic History  . Marital status: Married    Spouse name: Not on file  . Number of children: Not on file  . Years of education: Not on file  . Highest education level: Not on file  Occupational History  . Not on file  Social Needs  . Financial resource strain: Not on file  . Food insecurity    Worry: Not on file    Inability: Not on file  . Transportation needs    Medical: Not on file    Non-medical: Not on file  Tobacco Use  . Smoking status: Never Smoker  . Smokeless tobacco: Never Used  Substance and Sexual Activity  . Alcohol use: No  . Drug use: No  . Sexual activity: Not on file  Lifestyle  . Physical activity    Days per week: Not on file    Minutes per session: Not on file  . Stress: Not on file  Relationships  . Social Herbalist on phone: Not on file    Gets together: Not on file    Attends religious service: Not on file    Active member of club or organization: Not on file    Attends meetings of clubs or organizations: Not on file    Relationship status: Not on file  . Intimate partner violence    Fear of current or ex partner: Not on file    Emotionally abused: Not on file    Physically abused: Not on file    Forced sexual activity: Not on file  Other Topics Concern  . Not on file  Social History Narrative  . Not on file   Family History  Problem Relation Age of Onset  . Heart disease Father 21       CABG  . Diabetes Brother   . Cancer Brother 12       brain tumor  . Colon cancer Paternal Uncle   . Stroke Maternal Grandmother   . Cancer Maternal Aunt        brain cancer  . Diabetes Brother   . Esophageal cancer Neg Hx   . Rectal cancer Neg Hx   . Stomach cancer Neg Hx     Objective: Office vital signs reviewed. BP 137/88   Pulse 89   Temp 98 Richard (36.7 C) (Temporal)   Ht '5\' 10"'   (1.778 m)   Wt 236 lb 12.8 oz (107.4 kg)   SpO2 97%   BMI 33.98 kg/m   Physical Examination:  General: Awake, alert, well appearing, obese. No acute distress HEENT: Normal, sclera white, MMM Cardio: regular rate and rhythm, S1S2  heard, no murmurs appreciated Pulm: clear to auscultation bilaterally, no wheezes, rhonchi or rales; normal work of breathing on room air Extremities: warm, well perfused, No edema, cyanosis or clubbing; +2 pulses bilaterally Psych: Mood stable, speech normal, affect appropriate Neuro: see dm foot Diabetic Foot Exam - Simple   Simple Foot Form Diabetic Foot exam was performed with the following findings: Yes 10/11/2019  4:25 PM  Visual Inspection No deformities, no ulcerations, no other skin breakdown bilaterally: Yes Sensation Testing Intact to touch and monofilament testing bilaterally: Yes Pulse Check Posterior Tibialis and Dorsalis pulse intact bilaterally: Yes Comments Decreased vibratory sensation bilaterally    Depression screen [facility] 2/9 10/11/2019 06/11/2019 01/20/2019  Decreased Interest 1 0 0  Down, Depressed, Hopeless 1 0 0  PHQ - 2 Score 2 0 0  Altered sleeping 0 0 -  Tired, decreased energy 0 0 -  Change in appetite 0 0 -  Feeling bad or failure about yourself  0 0 -  Trouble concentrating 2 0 -  Moving slowly or fidgety/restless 0 0 -  Suicidal thoughts 0 0 -  PHQ-9 Score 4 0 -   GAD 7 : Generalized Anxiety Score 10/11/2019  Nervous, Anxious, on Edge 1  Control/stop worrying 1  Worry too much - different things 1  Trouble relaxing 2  Restless 0  Easily annoyed or irritable 2  Afraid - awful might happen 0  Total GAD 7 Score 7   Assessment/ Plan: 68 y.o. male   1. Type 2 diabetes mellitus without complication, without long-term current use of insulin ([place]) Now controlled with A1c of 6.9.  Continue current regimen.  Follow-up in 3 months for recheck - Bayer DCA Hb A1c Waived - glyBURIDE (DIABETA) 2.5 MG tablet; Take 1 tablet  (2.5 mg total) by mouth daily with breakfast.  Dispense: 90 tablet; Refill: 0  2. Hypertension associated with diabetes ([place]) Controlled.  Continue current regimen - CMP14+EGFR  3. Hyperlipidemia associated with type 2 diabetes mellitus ([place]) Lovaza was denied by his insurance.  He is to continue the statin.  Check lipid panel. - CMP14+EGFR - Lipid Panel  4. 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  I placed a referral to [facility] a couple of months ago but he is yet here for an appointment.  I will have the referral coordinator to reach out to them  5. Hyperplasia of prostate  6. Erectile dysfunction, unspecified erectile dysfunction type Refractory to the sildenafil.  He is going to try it again at the higher dose.  Renewal of the medicine has been sent.  Referral to urology as above.  7. Moodiness Effexor dose increased to 75 mg.   Orders Placed This Encounter  Procedures  . Bayer DCA Hb A1c Waived  . CMP14+EGFR   Meds ordered this encounter  Medications  . glyBURIDE (DIABETA) 2.5 MG tablet    Sig: Take 1 tablet (2.5 mg total) by mouth daily with breakfast.    Dispense:  90 tablet    Refill:  0  . DISCONTD: omega-3 acid ethyl esters (LOVAZA) 1 g capsule    Sig: Take 1 capsule (1 g total) by mouth 2 (two) times daily.    Dispense:  180 capsule    Refill:  3  . sildenafil (REVATIO) 20 MG tablet    Sig: TAKE 2-5 TABLETS AS NEEDED PRIOR TO SEXUAL ACTIVITY    Dispense:  50 tablet    Refill:  0  . venlafaxine  XR (EFFEXOR XR) 75 MG 24 hr capsule    Sig: Take 1 capsule (75 mg total) by mouth daily with breakfast.    Dispense:  90 capsule    Refill:  0     Richard Nelson Windell Moulding, DO [place] [phone]

## 2019-10-11 NOTE — Addendum Note (Signed)
Addended by: Ladean Raya on: 10/11/2019 04:43 PM   Modules accepted: Orders

## 2019-10-12 ENCOUNTER — Telehealth: Payer: Self-pay | Admitting: Family Medicine

## 2019-10-12 LAB — CMP14+EGFR
ALT: 23 IU/L (ref 0–44)
AST: 22 IU/L (ref 0–40)
Albumin/Globulin Ratio: 2.4 — ABNORMAL HIGH (ref 1.2–2.2)
Albumin: 4.5 g/dL (ref 3.8–4.8)
Alkaline Phosphatase: 93 IU/L (ref 39–117)
BUN/Creatinine Ratio: 16 (ref 10–24)
BUN: 11 mg/dL (ref 8–27)
Bilirubin Total: 0.7 mg/dL (ref 0.0–1.2)
CO2: 22 mmol/L (ref 20–29)
Calcium: 9.4 mg/dL (ref 8.6–10.2)
Chloride: 101 mmol/L (ref 96–106)
Creatinine, Ser: 0.7 mg/dL — ABNORMAL LOW (ref 0.76–1.27)
GFR calc Af Amer: 114 mL/min/{1.73_m2} (ref >59)
GFR calc non Af Amer: 98 mL/min/{1.73_m2} (ref >59)
Globulin, Total: 1.9 g/dL (ref 1.5–4.5)
Glucose: 107 mg/dL — ABNORMAL HIGH (ref 65–99)
Potassium: 4 mmol/L (ref 3.5–5.2)
Sodium: 140 mmol/L (ref 134–144)
Total Protein: 6.4 g/dL (ref 6.0–8.5)

## 2019-10-12 LAB — LIPID PANEL
Chol/HDL Ratio: 3.2 ratio (ref 0.0–5.0)
Cholesterol, Total: 121 mg/dL (ref 100–199)
HDL: 38 mg/dL — ABNORMAL LOW (ref >39)
LDL Chol Calc (NIH): 62 mg/dL (ref 0–99)
Triglycerides: 117 mg/dL (ref 0–149)
VLDL Cholesterol Cal: 21 mg/dL (ref 5–40)

## 2019-11-05 DIAGNOSIS — N5201 Erectile dysfunction due to arterial insufficiency: Secondary | ICD-10-CM | POA: Diagnosis not present

## 2019-11-21 ENCOUNTER — Other Ambulatory Visit: Payer: Self-pay | Admitting: Family Medicine

## 2019-11-24 IMAGING — DX DG CHEST 2V
2 series · 2 of 2 positions shown · non-contrast
Comparison: 02/13/2016

CLINICAL DATA: Yearly physical

EXAM:
CHEST - 2 VIEW

[chest pa]
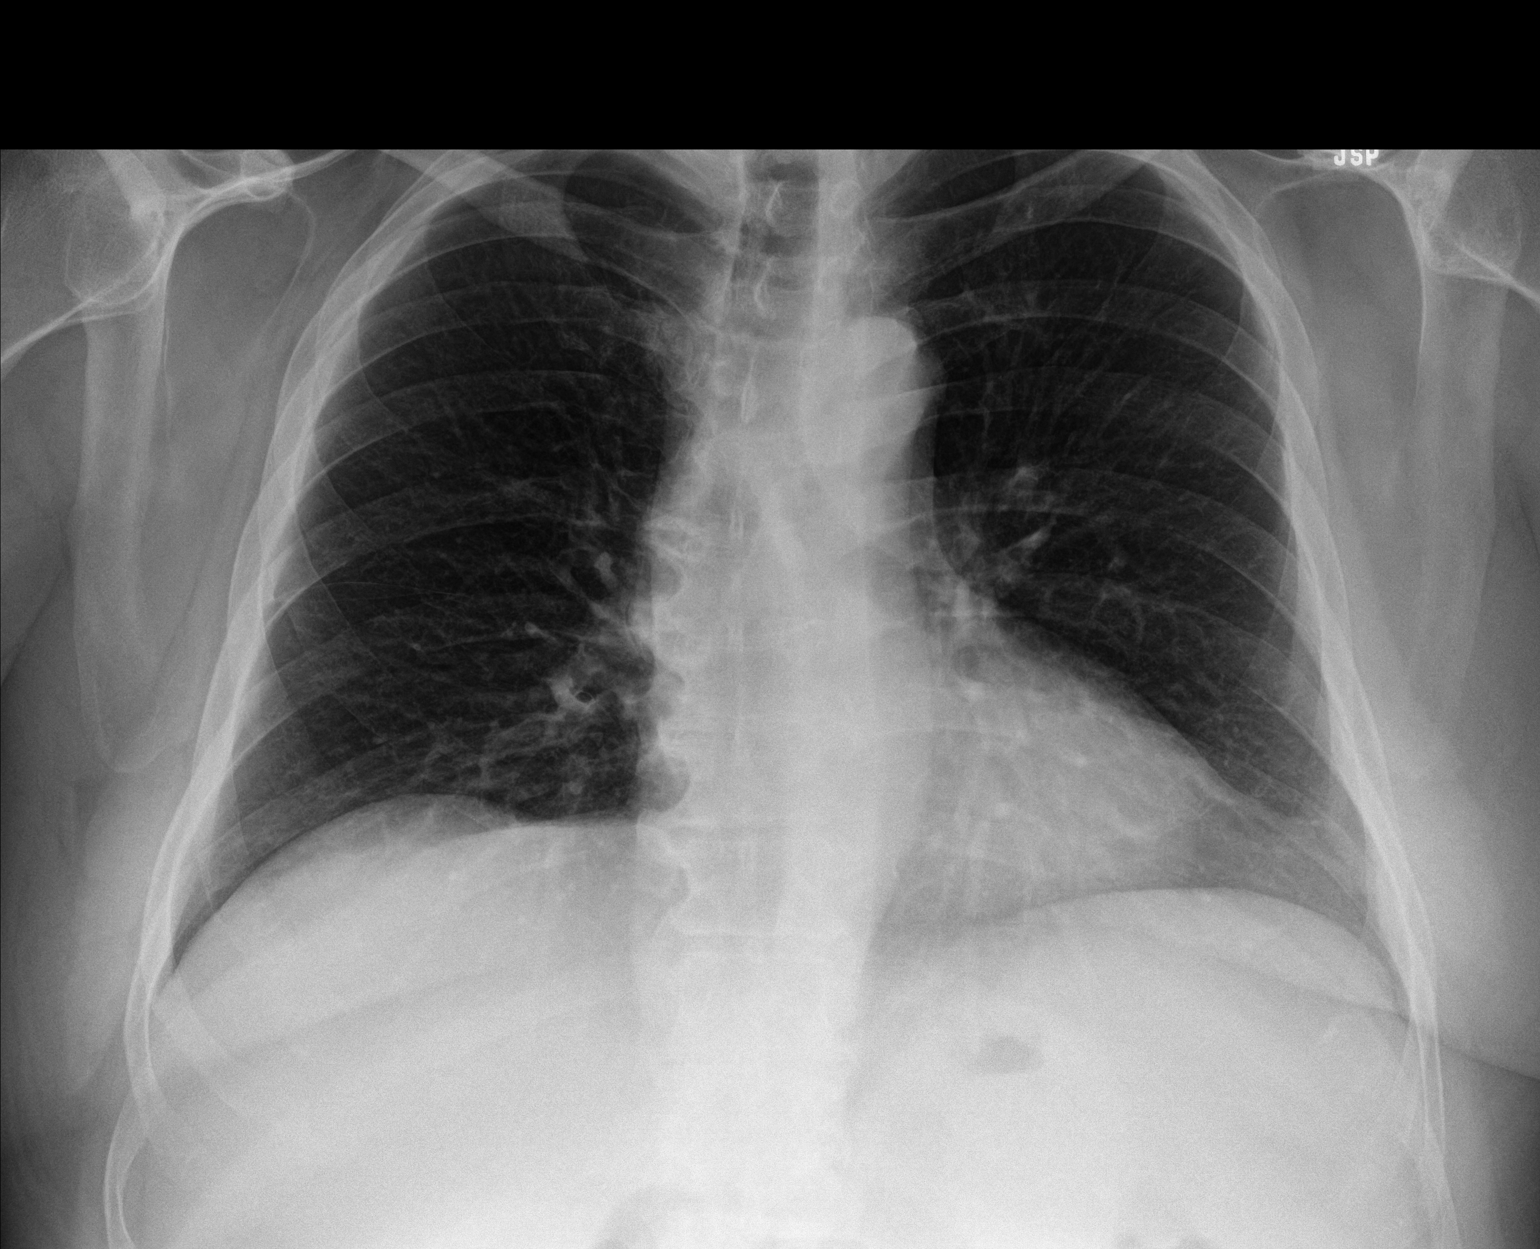

[chest lat]
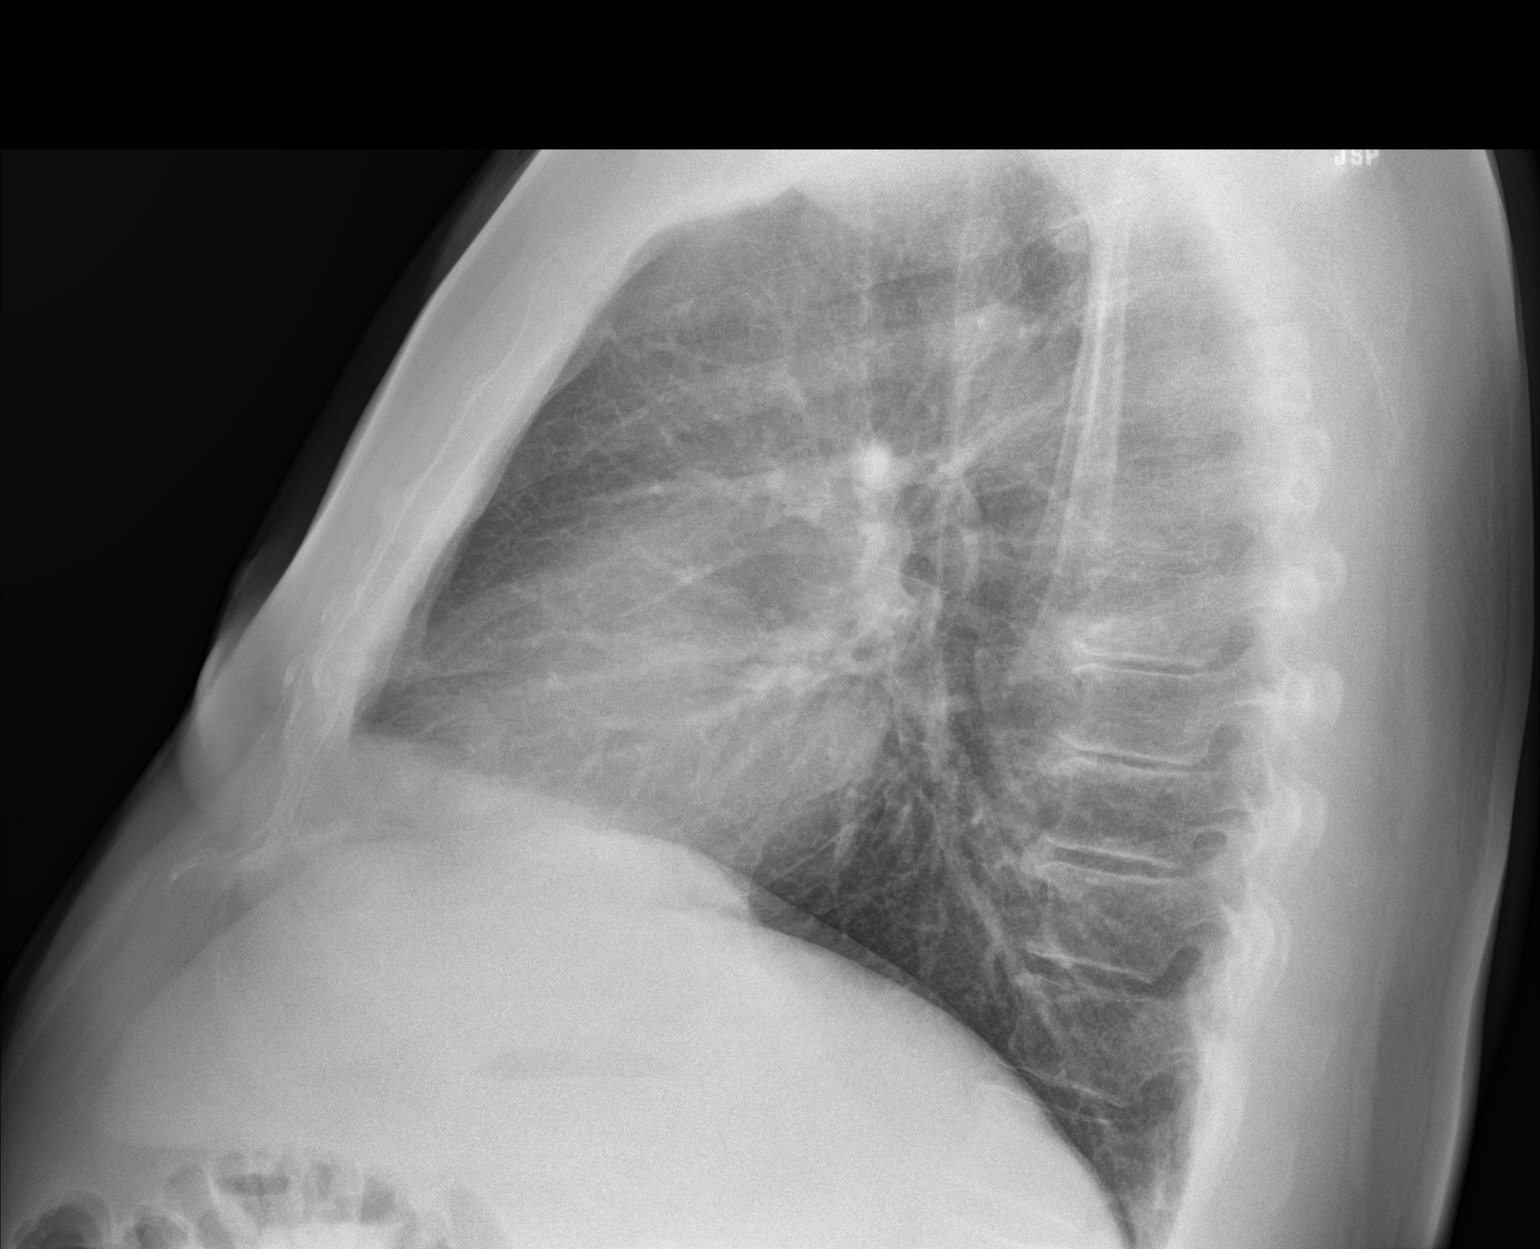

[2 of 2 positions shown; findings below may reference images not displayed]

FINDINGS: Heart and mediastinal contours are within normal limits. No focal
opacities or effusions. No acute bony abnormality.
IMPRESSION: No active cardiopulmonary disease.

## 2019-12-01 DIAGNOSIS — N5201 Erectile dysfunction due to arterial insufficiency: Secondary | ICD-10-CM | POA: Diagnosis not present

## 2019-12-01 DIAGNOSIS — E349 Endocrine disorder, unspecified: Secondary | ICD-10-CM | POA: Diagnosis not present

## 2019-12-04 ENCOUNTER — Other Ambulatory Visit: Payer: Self-pay | Admitting: Family Medicine

## 2019-12-04 DIAGNOSIS — E785 Hyperlipidemia, unspecified: Secondary | ICD-10-CM

## 2019-12-04 DIAGNOSIS — E1169 Type 2 diabetes mellitus with other specified complication: Secondary | ICD-10-CM

## 2019-12-06 DIAGNOSIS — N5201 Erectile dysfunction due to arterial insufficiency: Secondary | ICD-10-CM | POA: Diagnosis not present

## 2019-12-06 DIAGNOSIS — Z125 Encounter for screening for malignant neoplasm of prostate: Secondary | ICD-10-CM | POA: Diagnosis not present

## 2019-12-06 DIAGNOSIS — E349 Endocrine disorder, unspecified: Secondary | ICD-10-CM | POA: Diagnosis not present

## 2019-12-19 ENCOUNTER — Other Ambulatory Visit: Payer: Self-pay | Admitting: Family Medicine

## 2019-12-19 DIAGNOSIS — E119 Type 2 diabetes mellitus without complications: Secondary | ICD-10-CM

## 2020-01-02 ENCOUNTER — Other Ambulatory Visit: Payer: Self-pay | Admitting: Family Medicine

## 2020-01-03 DIAGNOSIS — N5201 Erectile dysfunction due to arterial insufficiency: Secondary | ICD-10-CM | POA: Diagnosis not present

## 2020-01-03 DIAGNOSIS — E349 Endocrine disorder, unspecified: Secondary | ICD-10-CM | POA: Diagnosis not present

## 2020-02-04 ENCOUNTER — Other Ambulatory Visit: Payer: Self-pay | Admitting: Family Medicine

## 2020-02-04 DIAGNOSIS — E1159 Type 2 diabetes mellitus with other circulatory complications: Secondary | ICD-10-CM

## 2020-02-16 ENCOUNTER — Other Ambulatory Visit: Payer: Self-pay | Admitting: Family Medicine

## 2020-02-27 ENCOUNTER — Other Ambulatory Visit: Payer: Self-pay | Admitting: Family Medicine

## 2020-02-27 DIAGNOSIS — E1159 Type 2 diabetes mellitus with other circulatory complications: Secondary | ICD-10-CM

## 2020-02-27 DIAGNOSIS — I152 Hypertension secondary to endocrine disorders: Secondary | ICD-10-CM

## 2020-03-08 ENCOUNTER — Other Ambulatory Visit: Payer: Self-pay | Admitting: Family Medicine

## 2020-03-08 DIAGNOSIS — E119 Type 2 diabetes mellitus without complications: Secondary | ICD-10-CM

## 2020-03-09 ENCOUNTER — Other Ambulatory Visit: Payer: Self-pay | Admitting: Family Medicine

## 2020-03-09 NOTE — Telephone Encounter (Signed)
Left message to call back to schedule an appointment for refill.

## 2020-03-15 ENCOUNTER — Other Ambulatory Visit: Payer: Self-pay | Admitting: Family Medicine

## 2020-03-15 DIAGNOSIS — E119 Type 2 diabetes mellitus without complications: Secondary | ICD-10-CM

## 2020-03-21 ENCOUNTER — Other Ambulatory Visit: Payer: Self-pay

## 2020-03-21 ENCOUNTER — Ambulatory Visit (INDEPENDENT_AMBULATORY_CARE_PROVIDER_SITE_OTHER): Payer: BC Managed Care – PPO | Admitting: Family Medicine

## 2020-03-21 VITALS — BP 138/78 | HR 90 | Temp 98.9°F | Ht 70.0 in | Wt 246.0 lb

## 2020-03-21 DIAGNOSIS — E1159 Type 2 diabetes mellitus with other circulatory complications: Secondary | ICD-10-CM

## 2020-03-21 DIAGNOSIS — E1169 Type 2 diabetes mellitus with other specified complication: Secondary | ICD-10-CM | POA: Diagnosis not present

## 2020-03-21 DIAGNOSIS — E785 Hyperlipidemia, unspecified: Secondary | ICD-10-CM

## 2020-03-21 DIAGNOSIS — E119 Type 2 diabetes mellitus without complications: Secondary | ICD-10-CM

## 2020-03-21 DIAGNOSIS — R4589 Other symptoms and signs involving emotional state: Secondary | ICD-10-CM

## 2020-03-21 DIAGNOSIS — I10 Essential (primary) hypertension: Secondary | ICD-10-CM

## 2020-03-21 LAB — BAYER DCA HB A1C WAIVED: HB A1C (BAYER DCA - WAIVED): 8.3 % — ABNORMAL HIGH (ref <7.0)

## 2020-03-21 MED ORDER — SITAGLIPTIN PHOSPHATE 100 MG PO TABS: 100.0000 mg | ORAL_TABLET | Freq: Every day | ORAL | 0 refills | Status: DC

## 2020-03-21 MED ORDER — GLYBURIDE 2.5 MG PO TABS: 2.5000 mg | ORAL_TABLET | Freq: Every day | ORAL | 0 refills | Status: DC

## 2020-03-21 NOTE — Progress Notes (Signed)
Subjective: CC: DM w/ HTN, HLD PCP: Richard Norlander, DO AJG:OTLXBWI [name]. is a 69 y.o. male presenting to clinic today for:  1. Type 2 Diabetes w/ HTN, HLD:  Patient has been noncompliant with diet since our last visit.  He continues to drink quite a bit of soda and admits to eating carb rich foods.  He has not been monitoring his blood sugars.  He is taking medication(s): Zigduo 5-1033m, glyburide 2.5 mg, Januvia 50 mg, Lipitor 20 mg, enalapril 5 mg.  Last eye exam: Up-to-date Last foot exam: Up-to-date Last A1c:  Lab Results  Component Value Date   HGBA1C 6.9 10/11/2019   Nephropathy screen indicated?: on ACE-I Last flu, zoster and/or pneumovax: PNA, flu vaccine needed Immunization History  Administered Date(s) Administered  . Influenza Inj Mdck Quad Pf 10/06/2017  . Influenza, High Dose Seasonal PF 09/09/2018, 08/29/2019  . Influenza,inj,Quad PF,6+ Mos 10/11/2019  . Influenza-Unspecified 10/16/2016  . Pneumococcal Conjugate-13 10/08/2012, 10/11/2019  . Tdap 06/15/2009  . Zoster 02/08/2013    ROS: No chest pain, shortness of breath, edema, dizziness or falls.  He has occasional tingling in the feet  2.  Moodiness He reports that moodiness improved and he self titrated off of the Effexor.  He is doing well from this standpoint.  ROS: Per HPI  Allergies  Allergen Reactions  . Sulfonamide Derivatives Swelling    SWELLING OF TONGUE  . Erythromycin Other (See Comments)    Patient unsure reaction   Past Medical History:  Diagnosis Date  . Allergy   . Anxiety states   . 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 Depressive disorder, not elsewhere classified   . DM (diabetes mellitus) ([place]   . Hemorrhage of rectum and anus   . Hyperlipidemia   . Hyperplasia of prostate   . Hypertension   . Polyp of colon     Current Outpatient Medications:  .  aspirin 81 MG EC tablet, Take 81 mg by mouth daily.  , Disp: , Rfl:  .  atorvastatin (LIPITOR) 20 MG  tablet, TAKE 1 TABLET BY MOUTH EVERY DAY, Disp: 90 tablet, Rfl: 1 .  Blood Glucose Monitoring Suppl (ONETOUCH VERIO) w/Device KIT, 1 kit by Does not apply route daily., Disp: 1 kit, Rfl: 0 .  doxycycline (VIBRA-TABS) 100 MG tablet, Take 1 tablet (100 mg total) by mouth 2 (two) times daily., Disp: 42 tablet, Rfl: 0 .  enalapril (VASOTEC) 5 MG tablet, TAKE 1 TABLET BY MOUTH EVERY DAY, Disp: 30 tablet, Rfl: 0 .  fluticasone (FLONASE) 50 MCG/ACT nasal spray, SPRAY 2 SPRAYS INTO EACH NOSTRIL EVERY DAY, Disp: 48 mL, Rfl: 1 .  glucose blood (ONETOUCH VERIO) test strip, Use as instructed, Disp: 100 each, Rfl: 12 .  glyBURIDE (DIABETA) 2.5 MG tablet, Take 1 tablet (2.5 mg total) by mouth daily with breakfast. (Needs to be seen before next refill), Disp: 30 tablet, Rfl: 0 .  JANUVIA 50 MG tablet, TAKE 1 TABLET BY MOUTH EVERY DAY, Disp: 90 tablet, Rfl: 0 .  ONETOUCH DELICA LANCETS 320BMISC, Test BS bid, Disp: 100 each, Rfl: 2 .  sildenafil (REVATIO) 20 MG tablet, TAKE 2-5 TABLETS AS NEEDED PRIOR TO SEXUAL ACTIVITY, Disp: 50 tablet, Rfl: 0 .  venlafaxine XR (EFFEXOR-XR) 75 MG 24 hr capsule, TAKE 1 CAPSULE (75 MG TOTAL) BY MOUTH DAILY WITH BREAKFAST., Disp: 90 capsule, Rfl: 0 .  Vitamin D, Ergocalciferol, (DRISDOL) 1.25 MG (50000 UT) CAPS capsule, TAKE 1 CAPSULE BY MOUTH  ONE TIME PER WEEK, Disp: 12 capsule, Rfl: 3 .  XIGDUO XR 04-999 MG TB24, TAKE 2 TABLETS BY MOUTH EVERY DAY, Disp: 180 tablet, Rfl: 0 Social History   Socioeconomic History  . Marital status: Married    Spouse name: Not on file  . Number of children: Not on file  . Years of education: Not on file  . Highest education level: Not on file  Occupational History  . Not on file  Tobacco Use  . Smoking status: Never Smoker  . Smokeless tobacco: Never Used  Substance and Sexual Activity  . Alcohol use: No  . Drug use: No  . Sexual activity: Not on file  Other Topics Concern  . Not on file  Social History Narrative  . Not on file    Social Determinants of Health   Financial Resource Strain:   . Difficulty of Paying Living Expenses:   Food Insecurity:   . Worried About Charity fundraiser in the Last Year:   . Arboriculturist in the Last Year:   Transportation Needs:   . Film/video editor (Medical):   Marland Kitchen Lack of Transportation (Non-Medical):   Physical Activity:   . Days of Exercise per Week:   . Minutes of Exercise per Session:   Stress:   . Feeling of Stress :   Social Connections:   . Frequency of Communication with Friends and Family:   . Frequency of Social Gatherings with Friends and Family:   . Attends Religious Services:   . Active Member of Clubs or Organizations:   . Attends Archivist Meetings:   Marland Kitchen Marital Status:   Intimate Partner Violence:   . Fear of Current or Ex-Partner:   . Emotionally Abused:   Marland Kitchen Physically Abused:   . Sexually Abused:    Family History  Problem Relation Age of Onset  . Heart disease Father 32       CABG  . Diabetes Brother   . Cancer Brother 51       brain tumor  . Colon cancer Paternal Uncle   . Stroke Maternal Grandmother   . Cancer Maternal Aunt        brain cancer  . Diabetes Brother   . Esophageal cancer Neg Hx   . Rectal cancer Neg Hx   . Stomach cancer Neg Hx     Objective: Office vital signs reviewed. BP 138/78   Pulse 90   Temp 98.9 F (37.2 C) (Temporal)   Ht '5\' 10"'  (1.778 m)   Wt 246 lb (111.6 kg)   SpO2 97%   BMI 35.30 kg/m   Physical Examination:  General: Awake, alert, well appearing, obese. No acute distress HEENT: Normal, sclera white, MMM Cardio: regular rate and rhythm, S1S2 heard, no murmurs appreciated Pulm: clear to auscultation bilaterally, no wheezes, rhonchi or rales; normal work of breathing on room air Extremities: warm, well perfused, No edema, cyanosis or clubbing; +2 pulses bilaterally Psych: Mood stable, speech normal, affect appropriate Depression screen [facility] 2/9 03/21/2020 10/11/2019 06/11/2019   Decreased Interest 0 1 0  Down, Depressed, Hopeless 0 1 0  PHQ - 2 Score 0 2 0  Altered sleeping 0 0 0  Tired, decreased energy 0 0 0  Change in appetite 0 0 0  Feeling bad or failure about yourself  0 0 0  Trouble concentrating 0 2 0  Moving slowly or fidgety/restless 0 0 0  Suicidal thoughts 0 0 0  PHQ-9 Score 0  4 0   Assessment/ Plan: 69 y.o. male   1. Type 2 diabetes mellitus without complication, without long-term current use of insulin ([facility]) A1c now uncontrolled at 8.3.  I have increased his Januvia to 100 mg daily.  He will continue the same duo.  Continue glyburide 2.5 mg daily.  Recheck in 3 months.  Reinforced need to taper off of sodas and restrict carbs. - Bayer DCA Hb A1c Waived - glyBURIDE (DIABETA) 2.5 MG tablet; Take 1 tablet (2.5 mg total) by mouth daily with breakfast.  Dispense: 90 tablet; Refill: 0  2. Hypertension associated with diabetes ([place]) Controlled.  Continue current regimen  3. Hyperlipidemia associated with type 2 diabetes mellitus ([place]) Continue statin  4. Moodiness Improved and now off of SNRI.   Orders Placed This Encounter  Procedures  . Bayer DCA Hb A1c Waived   No orders of the defined types were placed in this encounter.    Richard Norlander, DO [place] [phone]

## 2020-03-21 NOTE — Patient Instructions (Signed)
Sugar has gone way up since last visit.  Cut back on soda.  Drink that water you have in your car!  I have increased the Januvia to 100mg  daily.  You can take 2 of the 50mg  if you have a bunch left over at home.  Then switch to 1 of the 100mg  daily.  Continue glyburide and Xigduo (this was not a duplicate by the way)  123456 8.3

## 2020-03-24 DIAGNOSIS — E349 Endocrine disorder, unspecified: Secondary | ICD-10-CM | POA: Diagnosis not present

## 2020-03-24 DIAGNOSIS — Z125 Encounter for screening for malignant neoplasm of prostate: Secondary | ICD-10-CM | POA: Diagnosis not present

## 2020-03-28 ENCOUNTER — Other Ambulatory Visit: Payer: Self-pay | Admitting: Family Medicine

## 2020-03-31 DIAGNOSIS — E291 Testicular hypofunction: Secondary | ICD-10-CM | POA: Diagnosis not present

## 2020-03-31 DIAGNOSIS — N5201 Erectile dysfunction due to arterial insufficiency: Secondary | ICD-10-CM | POA: Diagnosis not present

## 2020-04-21 ENCOUNTER — Other Ambulatory Visit: Payer: Self-pay | Admitting: Family Medicine

## 2020-05-28 ENCOUNTER — Other Ambulatory Visit: Payer: Self-pay | Admitting: Family Medicine

## 2020-05-28 DIAGNOSIS — E785 Hyperlipidemia, unspecified: Secondary | ICD-10-CM

## 2020-06-08 ENCOUNTER — Other Ambulatory Visit: Payer: Self-pay | Admitting: Family Medicine

## 2020-06-08 DIAGNOSIS — E119 Type 2 diabetes mellitus without complications: Secondary | ICD-10-CM

## 2020-06-17 ENCOUNTER — Other Ambulatory Visit: Payer: Self-pay | Admitting: Family Medicine

## 2020-06-21 ENCOUNTER — Telehealth: Payer: Self-pay | Admitting: *Deleted

## 2020-06-21 NOTE — Telephone Encounter (Signed)
Prior Auth for Januvia 100mg -In Process  Key: [id number] -   PA Case ID: [id number]  Your information has been sent to [facility].

## 2020-06-22 NOTE — Telephone Encounter (Signed)
Pharmacy notified.

## 2020-06-22 NOTE — Telephone Encounter (Signed)
Approved 06/21/20-06/21/21

## 2020-07-07 ENCOUNTER — Other Ambulatory Visit: Payer: Self-pay | Admitting: Family Medicine

## 2020-07-07 DIAGNOSIS — E1159 Type 2 diabetes mellitus with other circulatory complications: Secondary | ICD-10-CM

## 2020-07-07 DIAGNOSIS — I152 Hypertension secondary to endocrine disorders: Secondary | ICD-10-CM

## 2020-07-15 ENCOUNTER — Other Ambulatory Visit: Payer: Self-pay | Admitting: Family Medicine

## 2020-07-16 ENCOUNTER — Other Ambulatory Visit: Payer: Self-pay | Admitting: Family Medicine

## 2020-07-16 DIAGNOSIS — E119 Type 2 diabetes mellitus without complications: Secondary | ICD-10-CM

## 2020-07-17 ENCOUNTER — Encounter: Payer: Self-pay | Admitting: Family Medicine

## 2020-07-17 ENCOUNTER — Telehealth: Payer: Self-pay | Admitting: *Deleted

## 2020-07-17 MED ORDER — SITAGLIPTIN PHOSPHATE 100 MG PO TABS: 100.0000 mg | ORAL_TABLET | Freq: Every day | ORAL | 0 refills | Status: DC

## 2020-07-17 MED ORDER — GLYBURIDE 2.5 MG PO TABS: 2.5000 mg | ORAL_TABLET | Freq: Every day | ORAL | 0 refills | Status: DC

## 2020-07-17 NOTE — Telephone Encounter (Signed)
[place]. NTBS 30 days given 06/20/20

## 2020-07-17 NOTE — Telephone Encounter (Signed)
[place]. NTBS

## 2020-07-17 NOTE — Telephone Encounter (Signed)
Pt has appt 08/07/20 with Dr Darnell Level.

## 2020-07-17 NOTE — Addendum Note (Signed)
Addended by: Zannie Cove on: 07/17/2020 03:37 PM   Modules accepted: Orders

## 2020-07-17 NOTE — Telephone Encounter (Signed)
error 

## 2020-07-17 NOTE — Addendum Note (Signed)
Addended by: Milas Hock on: 07/17/2020 03:48 PM   Modules accepted: Orders

## 2020-07-18 ENCOUNTER — Telehealth: Payer: Self-pay | Admitting: *Deleted

## 2020-07-18 NOTE — Telephone Encounter (Signed)
Pa for glyburide 2.5 started   Key: [id number]  Sent to plan  Immediate approval   Approved today PA Case: [id number], Status: Approved, Coverage Starts on: 07/18/2020 12:00:00 AM, Coverage Ends on: 07/18/2021 12:00:00 Am  Pharm aware [facility]

## 2020-08-07 ENCOUNTER — Ambulatory Visit (INDEPENDENT_AMBULATORY_CARE_PROVIDER_SITE_OTHER): Payer: BC Managed Care – PPO | Admitting: Family Medicine

## 2020-08-07 ENCOUNTER — Encounter: Payer: Self-pay | Admitting: Family Medicine

## 2020-08-07 ENCOUNTER — Other Ambulatory Visit: Payer: Self-pay

## 2020-08-07 VITALS — BP 124/74 | HR 86 | Temp 98.1°F | Ht 70.0 in | Wt 242.0 lb

## 2020-08-07 DIAGNOSIS — F439 Reaction to severe stress, unspecified: Secondary | ICD-10-CM

## 2020-08-07 DIAGNOSIS — I10 Essential (primary) hypertension: Secondary | ICD-10-CM

## 2020-08-07 DIAGNOSIS — E785 Hyperlipidemia, unspecified: Secondary | ICD-10-CM

## 2020-08-07 DIAGNOSIS — E1169 Type 2 diabetes mellitus with other specified complication: Secondary | ICD-10-CM | POA: Diagnosis not present

## 2020-08-07 DIAGNOSIS — E1159 Type 2 diabetes mellitus with other circulatory complications: Secondary | ICD-10-CM | POA: Diagnosis not present

## 2020-08-07 DIAGNOSIS — I152 Hypertension secondary to endocrine disorders: Secondary | ICD-10-CM

## 2020-08-07 LAB — BAYER DCA HB A1C WAIVED: HB A1C (BAYER DCA - WAIVED): 8.3 % — ABNORMAL HIGH (ref <7.0)

## 2020-08-07 MED ORDER — XIGDUO XR 5-1000 MG PO TB24: 2.0000 | ORAL_TABLET | Freq: Every day | ORAL | 3 refills | Status: DC

## 2020-08-07 MED ORDER — ATORVASTATIN CALCIUM 20 MG PO TABS: 20.0000 mg | ORAL_TABLET | Freq: Every day | ORAL | 3 refills | Status: DC

## 2020-08-07 MED ORDER — ENALAPRIL MALEATE 5 MG PO TABS: 5.0000 mg | ORAL_TABLET | Freq: Every day | ORAL | 3 refills | Status: DC

## 2020-08-07 NOTE — Patient Instructions (Addendum)
Sugar is totally unchanged, despite increase in Januvia. 8.3%. Goal <0.6%  I am certain we need to adjust medication but you are on quadruple therapy at the moment.  I want you to see Almyra Free for medication reconciliation.  We'll see what we can replace.

## 2020-08-07 NOTE — Progress Notes (Signed)
Subjective: CC: DM w/ HTN, HLD PCP: Richard Norlander, Richard Nelson ZHG:[id number] [name]. is a 69 y.o. male presenting to clinic today for:  1. Type 2 Diabetes w/ HTN, HLD:  Sugar uncontrolled last visit.  Reinforced diet modification.  He is taking medication(s): Zigduo 5-1078m, glyburide 2.5 mg, Januvia 1038m(increased last visit), Lipitor 20 mg, enalapril 5 mg. He continues to eat sugary foods including M&Ms on a regular basis.  While he has replaced some sodas with flavored water he still drinks these fairly regularly.  He does admit to multiple stressors including his geriatric parents, his spouse (whom he may be in the middle of a separation with), going back to school etc.  Last eye exam: done in July Last foot exam: Up-to-date Last A1c:  Lab Results  Component Value Date   HGBA1C 8.3 (H) 03/21/2020   Nephropathy screen indicated?: on ACE-I Last flu, zoster and/or pneumovax: UTD Immunization History  Administered Date(s) Administered   Influenza Inj Mdck Quad Pf 10/06/2017   Influenza, High Dose Seasonal PF 09/09/2018, 08/29/2019   Influenza,inj,Quad PF,6+ Mos 10/11/2019   Influenza-Unspecified 10/16/2016   Moderna SARS-COVID-2 Vaccination 01/12/2020, 02/09/2020   Pneumococcal Conjugate-13 10/08/2012, 10/11/2019   Tdap 06/15/2009, 11/12/2019   Zoster 02/08/2013    ROS: No chest pain, shortness of breath, edema, dizziness or falls.      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    Depressive disorder, not elsewhere classified    DM [place]diabetes mellitus) [place]   Hemorrhage of rectum and anus    Hyperlipidemia    Hyperplasia of prostate    Hypertension    Polyp of colon     Current Outpatient Medications:      aspirin 81 MG EC tablet, Take 81 mg by mouth daily.  , Disp: , Rfl:    atorvastatin (LIPITOR) 20 MG tablet, TAKE 1 TABLET BY MOUTH EVERY DAY, Disp: 90 tablet, Rfl: 1   Blood Glucose Monitoring Suppl (ONETOUCH VERIO) w/Device KIT, 1 kit by Does not apply route daily., Disp: 1 kit, Rfl: 0   enalapril (VASOTEC) 5 MG tablet, TAKE 1 TABLET BY MOUTH EVERY DAY, Disp: 30 tablet, Rfl: 2   fluticasone (FLONASE) 50 MCG/ACT nasal spray, SPRAY 2 SPRAYS INTO EACH NOSTRIL EVERY DAY, Disp: 48 mL, Rfl: 1   glucose blood (ONETOUCH VERIO) test strip, Use as instructed, Disp: 100 each, Rfl: 12   glyBURIDE (DIABETA) 2.5 MG tablet, Take 1 tablet (2.5 mg total) by mouth daily with breakfast., Disp: 90 tablet, Rfl: 0   ONETOUCH DELICA LANCETS 3334HISC, Test BS bid, Disp: 100 each, Rfl: 2   sildenafil (REVATIO) 20 MG tablet, TAKE 2-5 TABLETS AS NEEDED PRIOR TO SEXUAL ACTIVITY, Disp: 50 tablet, Rfl: 0   sitaGLIPtin (JANUVIA) 100 MG tablet, Take 1 tablet (100 mg total) by mouth daily. (Needs to be seen before next refill), Disp: 30 tablet, Rfl: 0   Vitamin D, Ergocalciferol, (DRISDOL) 1.25 MG (50000 UT) CAPS capsule, TAKE 1 CAPSULE BY MOUTH ONE TIME PER WEEK, Disp: 12 capsule, Rfl: 3   XIGDUO XR 04-999 MG TB24, TAKE 2 TABLETS BY MOUTH EVERY DAY, Disp: 180 tablet, Rfl: 0 Social History   Socioeconomic History  Marital status: Married    Spouse name: Not on file   Number of children: Not on file   Years of education: Not on file   Highest education level: Not on file  Occupational History   Not on file  Tobacco Use   Smoking status: Never Smoker   Smokeless tobacco: Never Used  Vaping Use   Vaping Use: Never used  Substance and Sexual Activity   Alcohol use: No   Drug use: No   Sexual activity: Not on file  Other Topics Concern   Not on file  Social History Narrative   Not on file   Social Determinants of Health   Financial Resource Strain:    Difficulty of Paying Living  Expenses: Not on file  Food Insecurity:    Worried About [place] in the Last Year: Not on file   Ran Out of Food in the Last Year: Not on file  Transportation Needs:    Lack of Transportation (Medical): Not on file   Lack of Transportation (Non-Medical): Not on file  Physical Activity:    Days of Exercise per Week: Not on file   Minutes of Exercise per Session: Not on file  Stress:    Feeling of Stress : Not on file  Social Connections:    Frequency of Communication with Friends and Family: Not on file   Frequency of Social Gatherings with Friends and Family: Not on file   Attends Religious Services: Not on file   Active Member of Clubs or Organizations: Not on file   Attends Archivist Meetings: Not on file   Marital Status: Not on file  Intimate Partner Violence:    Fear of Current or Ex-Partner: Not on file   Emotionally Abused: Not on file   Physically Abused: Not on file   Sexually Abused: Not on file   Family History  Problem Relation Age of Onset   Heart disease Father 38       CABG   Diabetes Brother    Cancer Brother 86       brain tumor   Colon cancer Paternal Uncle    Stroke Maternal Grandmother    Cancer Maternal Aunt        brain cancer   Diabetes Brother    Esophageal cancer Neg Hx    Rectal cancer Neg Hx    Stomach cancer Neg Hx     Objective: Office vital signs reviewed. BP 124/74    Pulse 86    Temp 98.1 F (36.7 C) (Temporal)    Ht _0  (1.778 m)    Wt 242 lb (109.8 kg)    SpO2 99%    BMI 34.72 kg/m   Physical Examination:  General: Awake, alert, well appearing, obese. No acute distress HEENT: Normal, sclera white, MMM Cardio: regular rate and rhythm, S1S2 heard, no murmurs appreciated Pulm: clear to auscultation bilaterally, mild expiratory wheezes at the apex of lungs, No rhonchi or rales; normal work of breathing on room air Extremities: warm, well perfused, No edema, cyanosis or clubbing; +2  pulses bilaterally Psych: Mood stable, speech normal, affect appropriate Depression screen [facility] 2/9 03/21/2020 10/11/2019 06/11/2019  Decreased Interest 0 1 0  Down, Depressed, Hopeless 0 1 0  PHQ - 2 Score 0 2 0  Altered sleeping 0 0 0  Tired, decreased energy 0 0 0  Change in appetite 0 0 0  Feeling bad or failure about yourself  0 0 0  Trouble  concentrating 0 2 0  Moving slowly or fidgety/restless 0 0 0  Suicidal thoughts 0 0 0  PHQ-9 Score 0 4 0   Assessment/ Plan: 69 y.o. male   1. Type 2 diabetes mellitus with other specified complication, without long-term current use of insulin (HCC) Not controlled.  A1c unchanged.  Check despite increased dose of Januvia.  Given quadruple therapy, I Richard Nelson think that he should be seen by our clinical pharmacist.  He is currently maxed out on Metformin and dapagliflozin.  He is maxed out on Januvia.  Unsure how much the glyburide is actually impacting his sugar at this point.  I worry that he may need to go on to an injectable.  May need to consider discontinuing Januvia and starting something like Ozempic.  Have asked that he follow-up with Almyra Free within the next 2 weeks for medication reconciliation.  I reinforced need to carb restrict.  He has been very noncompliant with a carb restricted diet, which is likely impacting much of why his sugars are not controlled - Bayer DCA Hb A1c Waived - Dapagliflozin-metFORMIN HCl ER (XIGDUO XR) 04-999 MG TB24; Take 2 tablets by mouth daily.  Dispense: 180 tablet; Refill: 3  2. Hypertension associated with diabetes ([place]) Controlled - enalapril (VASOTEC) 5 MG tablet; Take 1 tablet (5 mg total) by mouth daily.  Dispense: 90 tablet; Refill: 3  3. Hyperlipidemia associated with type 2 diabetes mellitus (HCC) Continue statin - atorvastatin (LIPITOR) 20 MG tablet; Take 1 tablet (20 mg total) by mouth daily.  Dispense: 90 tablet; Refill: 3  4. Morbid obesity ([place])  5. Stress at home    Orders Placed This Encounter    Procedures   Bayer DCA Hb A1c Waived   No orders of the defined types were placed in this encounter.    Richard Norlander, Richard Nelson [place] [phone]

## 2020-08-12 ENCOUNTER — Other Ambulatory Visit: Payer: Self-pay | Admitting: Family Medicine

## 2020-08-24 ENCOUNTER — Other Ambulatory Visit: Payer: Self-pay | Admitting: Family Medicine

## 2020-08-24 ENCOUNTER — Ambulatory Visit: Payer: BC Managed Care – PPO | Admitting: Pharmacist

## 2020-08-24 ENCOUNTER — Telehealth: Payer: Self-pay | Admitting: Pharmacist

## 2020-08-24 DIAGNOSIS — E119 Type 2 diabetes mellitus without complications: Secondary | ICD-10-CM

## 2020-08-24 MED ORDER — FREESTYLE LIBRE 2 READER DEVI: 0 refills | Status: DC

## 2020-08-24 MED ORDER — FREESTYLE LIBRE 2 SENSOR MISC: 11 refills | Status: DC

## 2020-08-24 NOTE — Telephone Encounter (Signed)
Called in freestyle libre to help with patient with daily BGs Discussed medications Patient denies side effects, etc He is taking medication as prescribed Encouraged dietary changes and exercise He reports multiple stressors in his life Encouraged patient to call if BGs>200 or <70

## 2020-09-03 DIAGNOSIS — S61501A Unspecified open wound of right wrist, initial encounter: Secondary | ICD-10-CM | POA: Diagnosis not present

## 2020-09-03 DIAGNOSIS — W540XXA Bitten by dog, initial encounter: Secondary | ICD-10-CM | POA: Diagnosis not present

## 2020-09-27 DIAGNOSIS — E291 Testicular hypofunction: Secondary | ICD-10-CM | POA: Diagnosis not present

## 2020-09-27 DIAGNOSIS — Z125 Encounter for screening for malignant neoplasm of prostate: Secondary | ICD-10-CM | POA: Diagnosis not present

## 2020-10-02 DIAGNOSIS — E291 Testicular hypofunction: Secondary | ICD-10-CM | POA: Diagnosis not present

## 2020-11-08 ENCOUNTER — Other Ambulatory Visit: Payer: Self-pay

## 2020-11-08 ENCOUNTER — Ambulatory Visit (INDEPENDENT_AMBULATORY_CARE_PROVIDER_SITE_OTHER): Payer: BC Managed Care – PPO | Admitting: Family Medicine

## 2020-11-08 ENCOUNTER — Encounter: Payer: Self-pay | Admitting: Family Medicine

## 2020-11-08 VITALS — BP 122/75 | HR 98 | Temp 97.4°F | Ht 70.0 in | Wt 237.8 lb

## 2020-11-08 DIAGNOSIS — Z23 Encounter for immunization: Secondary | ICD-10-CM | POA: Diagnosis not present

## 2020-11-08 DIAGNOSIS — E785 Hyperlipidemia, unspecified: Secondary | ICD-10-CM

## 2020-11-08 DIAGNOSIS — E1169 Type 2 diabetes mellitus with other specified complication: Secondary | ICD-10-CM

## 2020-11-08 DIAGNOSIS — E1159 Type 2 diabetes mellitus with other circulatory complications: Secondary | ICD-10-CM

## 2020-11-08 DIAGNOSIS — I152 Hypertension secondary to endocrine disorders: Secondary | ICD-10-CM | POA: Diagnosis not present

## 2020-11-08 LAB — BAYER DCA HB A1C WAIVED: HB A1C (BAYER DCA - WAIVED): 8.6 % — ABNORMAL HIGH (ref <7.0)

## 2020-11-08 NOTE — Progress Notes (Signed)
Subjective: CC: DM w/ HTN, HLD PCP: Janora Norlander, DO ZSM:OLMBEML [name]. is a 69 y.o. male presenting to clinic today for:  1. Type 2 Diabetes w/ HTN, HLD:  Sugar uncontrolled last visit.  Reinforced diet modification.  He is taking medication(s): Zigduo 5-1048m, glyburide 2.5 mg, Januvia 1084m(increased last visit), Lipitor 20 mg, enalapril 5 mg. He was placed on a Freestyle libre to monitor BGs.  He admits to not really monitoring his blood sugar regularly.  It runs anywhere between 95 and 200s.  He is reduce candy consumption but still drinks soda regularly and has been enjoying fruit cake in the holidays.  He denies any blurred vision, dry mouth.  He does report urinary frequency but he attributes this to the zig duo  Last eye exam: done in July Last foot exam: Up-to-date Last A1c:  Lab Results  Component Value Date   HGBA1C 8.3 (H) 08/07/2020   Nephropathy screen indicated?: on ACE-I Last flu, zoster and/or pneumovax: UTD Immunization History  Administered Date(s) Administered  . Influenza Inj Mdck Quad Pf 10/06/2017  . Influenza, High Dose Seasonal PF 09/09/2018, 08/29/2019  . Influenza,inj,Quad PF,6+ Mos 10/11/2019  . Influenza-Unspecified 10/16/2016  . Moderna SARS-COVID-2 Vaccination 01/12/2020, 02/09/2020  . Pneumococcal Conjugate-13 10/08/2012, 10/11/2019  . Tdap 06/15/2009, 11/12/2019  . Zoster 02/08/2013   ROS: Per HPI  Allergies  Allergen Reactions  . Sulfonamide Derivatives Swelling    SWELLING OF TONGUE  . Erythromycin Other (See Comments)    Patient unsure reaction   Past Medical History:  Diagnosis Date  . Allergy   . Anxiety states   . 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 Depressive disorder, not elsewhere classified   . DM (diabetes mellitus) ([place]  . Hemorrhage of rectum and anus   . Hyperlipidemia   . Hyperplasia of prostate   . Hypertension   . Polyp of colon     Current Outpatient Medications:  .  aspirin 81 MG  EC tablet, Take 81 mg by mouth daily.  , Disp: , Rfl:  .  atorvastatin (LIPITOR) 20 MG tablet, Take 1 tablet (20 mg total) by mouth daily., Disp: 90 tablet, Rfl: 3 .  Blood Glucose Monitoring Suppl (ONETOUCH VERIO) w/Device KIT, 1 kit by Does not apply route daily., Disp: 1 kit, Rfl: 0 .  clomiPHENE (CLOMID) 50 MG tablet, Take 25 mg by mouth daily., Disp: , Rfl:  .  Continuous Blood Gluc Receiver (FREESTYLE LIBRE 2 READER) DEVI, Use to test blood sugar 6 times daily as directed., Disp: 1 each, Rfl: 0 .  Continuous Blood Gluc Sensor (FREESTYLE LIBRE 2 SENSOR) MISC, Use to test blood sugar 6 times daily as directed., Disp: 2 each, Rfl: 11 .  Dapagliflozin-metFORMIN HCl ER (XIGDUO XR) 04-999 MG TB24, Take 2 tablets by mouth daily., Disp: 180 tablet, Rfl: 3 .  enalapril (VASOTEC) 5 MG tablet, Take 1 tablet (5 mg total) by mouth daily., Disp: 90 tablet, Rfl: 3 .  fluticasone (FLONASE) 50 MCG/ACT nasal spray, SPRAY 2 SPRAYS INTO EACH NOSTRIL EVERY DAY, Disp: 48 mL, Rfl: 1 .  glucose blood (ONETOUCH VERIO) test strip, Use as instructed, Disp: 100 each, Rfl: 12 .  glyBURIDE (DIABETA) 2.5 MG tablet, TAKE 1 TABLET (2.5 MG TOTAL) BY MOUTH DAILY WITH BREAKFAST., Disp: 90 tablet, Rfl: 0 .  ONETOUCH DELICA LANCETS [id number], Test BS bid, Disp: 100 each, Rfl: 2 .  sildenafil (REVATIO) 20 MG tablet, TAKE 2-5 TABLETS AS NEEDED  PRIOR TO SEXUAL ACTIVITY, Disp: 50 tablet, Rfl: 0 .  sitaGLIPtin (JANUVIA) 100 MG tablet, Take 1 tablet (100 mg total) by mouth daily., Disp: 30 tablet, Rfl: 2 .  Vitamin D, Ergocalciferol, (DRISDOL) 1.25 MG (50000 UT) CAPS capsule, TAKE 1 CAPSULE BY MOUTH ONE TIME PER WEEK, Disp: 12 capsule, Rfl: 3 Social History   Socioeconomic History  . Marital status: Married    Spouse name: Not on file  . Number of children: Not on file  . Years of education: Not on file  . Highest education level: Not on file  Occupational History  . Not on file  Tobacco Use  . Smoking status: Never Smoker  .  Smokeless tobacco: Never Used  Vaping Use  . Vaping Use: Never used  Substance and Sexual Activity  . Alcohol use: No  . Drug use: No  . Sexual activity: Not on file  Other Topics Concern  . Not on file  Social History Narrative  . Not on file   Social Determinants of Health   Financial Resource Strain:   . Difficulty of Paying Living Expenses: Not on file  Food Insecurity:   . Worried About [employer] in the Last Year: Not on file  . Ran Out of Food in the Last Year: Not on file  Transportation Needs:   . Lack of Transportation (Medical): Not on file  . Lack of Transportation (Non-Medical): Not on file  Physical Activity:   . Days of Exercise per Week: Not on file  . Minutes of Exercise per Session: Not on file  Stress:   . Feeling of Stress : Not on file  Social Connections:   . Frequency of Communication with Friends and Family: Not on file  . Frequency of Social Gatherings with Friends and Family: Not on file  . Attends Religious Services: Not on file  . Active Member of Clubs or Organizations: Not on file  . Attends Archivist Meetings: Not on file  . Marital Status: Not on file  Intimate Partner Violence:   . Fear of Current or Ex-Partner: Not on file  . Emotionally Abused: Not on file  . Physically Abused: Not on file  . Sexually Abused: Not on file   Family History  Problem Relation Age of Onset  . Heart disease Father 61       CABG  . Diabetes Brother   . Cancer Brother 52       brain tumor  . Colon cancer Paternal Uncle   . Stroke Maternal Grandmother   . Cancer Maternal Aunt        brain cancer  . Diabetes Brother   . Esophageal cancer Neg Hx   . Rectal cancer Neg Hx   . Stomach cancer Neg Hx     Objective: Office vital signs reviewed. BP 122/75   Pulse 98   Temp (!) 97.4 F (36.3 C)   Ht '5\' 10"'  (1.778 m)   Wt 237 lb 12.8 oz (107.9 kg)   SpO2 96%   BMI 34.12 kg/m   Physical Examination:  General: Awake, alert, well  appearing. No acute distress HEENT: Normal, sclera white, MMM Cardio: regular rate and rhythm, S1S2 heard, no murmurs appreciated Pulm: clear to auscultation bilaterally, mild expiratory wheezes at the apex of lungs, No rhonchi or rales; normal work of breathing on room air Extremities: warm, well perfused, No edema, cyanosis or clubbing; +2 pulses bilaterally Psych: Mood stable, speech normal, affect appropriate Neuro: see  DM foot Diabetic Foot Exam - Simple   Simple Foot Form Diabetic Foot exam was performed with the following findings: Yes 11/08/2020  4:05 PM  Visual Inspection No deformities, no ulcerations, no other skin breakdown bilaterally: Yes Sensation Testing Intact to touch and monofilament testing bilaterally: Yes Pulse Check Posterior Tibialis and Dorsalis pulse intact bilaterally: Yes Comments     Depression screen [facility] 2/9 11/08/2020 08/07/2020 03/21/2020  Decreased Interest 0 0 0  Down, Depressed, Hopeless 0 0 0  PHQ - 2 Score 0 0 0  Altered sleeping - 0 0  Tired, decreased energy - 0 0  Change in appetite - 0 0  Feeling bad or failure about yourself  - 0 0  Trouble concentrating - 0 0  Moving slowly or fidgety/restless - 0 0  Suicidal thoughts - 0 0  PHQ-9 Score - 0 0   Assessment/ Plan: 69 y.o. male   Type 2 diabetes mellitus with other specified complication, without long-term current use of insulin (HCC) - Plan: Bayer DCA Hb A1c Waived, CANCELED: HgB A1c  Hypertension associated with diabetes ([place]) - Plan: CMP14+EGFR  Hyperlipidemia associated with type 2 diabetes mellitus ([place]) - Plan: CMP14+EGFR, LDL Cholesterol, Direct  Sugar continues to rise and A1c is up to 8.6 now.  I did stress my concern with regards to his rising blood sugar and continued consumption of carbohydrates.  I am going to see if perhaps we can arrange a visit with Almyra Free for discussion of Ozempic versus Rybelsus.  Perhaps even Actos may be an option for this patient.  We would probably  need to discontinue Januvia to reduce risk of pancreatitis.  For now would like him to continue with the zig duo.  Continue glyburide.  I offered referral to endocrinology today as well Pneumococcal vaccination and foot exam were performed  Orders Placed This Encounter  Procedures  . Bayer DCA Hb A1c Waived   No orders of the defined types were placed in this encounter.    Janora Norlander, DO [place] [phone]

## 2020-11-08 NOTE — Patient Instructions (Signed)
Sugar continues to increase. Two options at this point, can see an endocrinologist OR I want you to see Almyra Free next week for other sugar options.  I think Rybelsus or Ozempic would be a good fit for you but it would mean we'd have to discontinue the Januvia.  She often has samples of both of these  Got to get serious about restricting carbs/ sugar.  You're risk of stroke and heart attack is really high right now.  It'd be a shame to suffer debility/ amputation for something that could have been prevented with tighter sugar control.

## 2020-11-08 NOTE — Addendum Note (Signed)
Addended by: Baldomero Lamy B on: 11/08/2020 04:22 PM   Modules accepted: Orders

## 2020-11-09 LAB — CMP14+EGFR
ALT: 22 IU/L (ref 0–44)
AST: 23 IU/L (ref 0–40)
Albumin/Globulin Ratio: 2 (ref 1.2–2.2)
Albumin: 4.3 g/dL (ref 3.8–4.8)
Alkaline Phosphatase: 83 IU/L (ref 44–121)
BUN/Creatinine Ratio: 13 (ref 10–24)
BUN: 16 mg/dL (ref 8–27)
Bilirubin Total: 0.3 mg/dL (ref 0.0–1.2)
CO2: 25 mmol/L (ref 20–29)
Calcium: 9.4 mg/dL (ref 8.6–10.2)
Chloride: 101 mmol/L (ref 96–106)
Creatinine, Ser: 1.21 mg/dL (ref 0.76–1.27)
GFR calc Af Amer: 71 mL/min/{1.73_m2} (ref >59)
GFR calc non Af Amer: 61 mL/min/{1.73_m2} (ref >59)
Globulin, Total: 2.2 g/dL (ref 1.5–4.5)
Glucose: 105 mg/dL — ABNORMAL HIGH (ref 65–99)
Potassium: 4.4 mmol/L (ref 3.5–5.2)
Sodium: 138 mmol/L (ref 134–144)
Total Protein: 6.5 g/dL (ref 6.0–8.5)

## 2020-11-09 LAB — LDL CHOLESTEROL, DIRECT: LDL Direct: 62 mg/dL (ref 0–99)

## 2020-11-10 ENCOUNTER — Other Ambulatory Visit: Payer: Self-pay | Admitting: Family Medicine

## 2020-11-10 DIAGNOSIS — E119 Type 2 diabetes mellitus without complications: Secondary | ICD-10-CM

## 2020-11-13 ENCOUNTER — Other Ambulatory Visit: Payer: Self-pay | Admitting: Family Medicine

## 2020-11-17 ENCOUNTER — Ambulatory Visit: Payer: BC Managed Care – PPO | Admitting: Pharmacist

## 2020-11-20 ENCOUNTER — Telehealth: Payer: Self-pay

## 2020-11-21 ENCOUNTER — Encounter: Payer: Self-pay | Admitting: Family Medicine

## 2020-11-21 NOTE — Telephone Encounter (Signed)
Still would benefit from medication adjustment as this is still not at his goal of less than seven.  He was supposed to schedule a follow-up visit with Almyra Free for medication reconciliation.  Please make sure that he does so

## 2020-11-21 NOTE — Telephone Encounter (Signed)
Pt called requesting to speak with Almyra Free again.  Asked pt what he was call was regarding and pt said "she knows what im calling about."   Did not want to tell me.  Almyra Free, please call patient when you are available. I did make him aware that you have a full schedule.

## 2020-11-22 NOTE — Telephone Encounter (Signed)
Sorry in advance:  Patient keeps calling me and I'm unable to reach him When I call him back I get VM--> VM is full and not accepting messages  See if he can schedule appt.?  I'm booked the rest of the week and will likely continue to play phone tag with him

## 2020-11-24 ENCOUNTER — Other Ambulatory Visit: Payer: Self-pay | Admitting: Family Medicine

## 2020-11-28 NOTE — Telephone Encounter (Signed)
No call back - this encounter will be closed.  

## 2020-12-25 ENCOUNTER — Other Ambulatory Visit: Payer: Self-pay | Admitting: Family Medicine

## 2020-12-25 DIAGNOSIS — E119 Type 2 diabetes mellitus without complications: Secondary | ICD-10-CM

## 2021-02-02 ENCOUNTER — Other Ambulatory Visit: Payer: Self-pay | Admitting: Family Medicine

## 2021-02-07 ENCOUNTER — Other Ambulatory Visit: Payer: Self-pay

## 2021-02-07 ENCOUNTER — Ambulatory Visit (INDEPENDENT_AMBULATORY_CARE_PROVIDER_SITE_OTHER): Payer: BC Managed Care – PPO | Admitting: Family Medicine

## 2021-02-07 ENCOUNTER — Encounter: Payer: Self-pay | Admitting: Family Medicine

## 2021-02-07 VITALS — BP 125/68 | HR 81 | Temp 98.0°F | Ht 70.0 in | Wt 239.8 lb

## 2021-02-07 DIAGNOSIS — E1169 Type 2 diabetes mellitus with other specified complication: Secondary | ICD-10-CM

## 2021-02-07 DIAGNOSIS — S6991XA Unspecified injury of right wrist, hand and finger(s), initial encounter: Secondary | ICD-10-CM | POA: Diagnosis not present

## 2021-02-07 DIAGNOSIS — E1159 Type 2 diabetes mellitus with other circulatory complications: Secondary | ICD-10-CM | POA: Diagnosis not present

## 2021-02-07 DIAGNOSIS — I152 Hypertension secondary to endocrine disorders: Secondary | ICD-10-CM

## 2021-02-07 DIAGNOSIS — M62830 Muscle spasm of back: Secondary | ICD-10-CM | POA: Diagnosis not present

## 2021-02-07 DIAGNOSIS — E785 Hyperlipidemia, unspecified: Secondary | ICD-10-CM

## 2021-02-07 LAB — BAYER DCA HB A1C WAIVED: HB A1C (BAYER DCA - WAIVED): 7.9 % — ABNORMAL HIGH (ref <7.0)

## 2021-02-07 MED ORDER — CYCLOBENZAPRINE HCL 10 MG PO TABS: 10.0000 mg | ORAL_TABLET | Freq: Three times a day (TID) | ORAL | 0 refills | Status: DC | PRN

## 2021-02-07 MED ORDER — RYBELSUS 3 MG PO TABS: 1.0000 | ORAL_TABLET | Freq: Every day | ORAL | 0 refills | Status: DC

## 2021-02-07 MED ORDER — RYBELSUS 7 MG PO TABS: 1.0000 | ORAL_TABLET | Freq: Every day | ORAL | 0 refills | Status: DC

## 2021-02-07 MED ORDER — RYBELSUS 14 MG PO TABS: 1.0000 | ORAL_TABLET | Freq: Every day | ORAL | 99 refills | Status: DC

## 2021-02-07 NOTE — Patient Instructions (Signed)
STOP Januvia START Rybelsus 3mg  daily Continue all other meds  Flexeril sent for muscle spasm, use heat Consider massage with Candice at [facility] in [place]:  [phone]  ICE that finger!  Protect it!   Jammed Finger A jammed finger is an injury to the ligaments that support your finger bones. Ligaments are bands of tissue that connect bones to each other. This injury happens when the ligaments are stretched beyond their normal range of motion (sprained). Symptoms may include:  Pain.  Swelling.  Discoloration and bruising around the joint.  Difficulty bending or straightening (extending) the finger.  Not being able to use the finger normally. Treatment may include:  Wearing a splint to keep the finger in place while it heals.  Taping the injured finger to the fingers beside it (buddy taping) to keep it in place.  Pain medicines.  Physical therapy to help you regain finger strength and range of motion. Follow these instructions at home: If you have a splint or buddy taping:  Wear the splint or tape as told by your health care provider. Remove the splint or tape only as told by your health care provider.  Loosen the splint or tape if your finger tingles, becomes numb, or turns cold and blue.  Cover the splint or tape with a watertight covering when you take a bath or a shower.  If you have buddy taping, ask your health care provider if it needs to be adjusted or removed and redone periodically.   Managing pain, stiffness, and swelling  If directed, put ice on the injured finger: ? Put ice in a plastic bag. ? Place a towel between your skin and the bag. ? Leave the ice on for 20 minutes, 2-3 times a day.  Raise (elevate) the injured finger above the level of your heart while you are sitting or lying down. General instructions  Take over-the-counter and prescription medicines only as told by your health care provider.  Rest your finger until your health care  provider says you can move it again. Your finger may feel stiff and painful for a while.  Do physical therapy exercises as directed. It may help to start doing these exercises with your hand in a bowl of warm water.  Keep all follow-up visits as told by your health care provider. This is important. Contact a health care provider if:  You have pain or swelling that gets worse or does not get better with medicine.  You have been treated for your jammed finger but you still cannot extend the finger.  You have a fever. Get help right away if:  Even after loosening your splint or tape, your finger: ? Is very red and swollen. ? Is white or blue. ? Feels tingly or becomes numb. Summary  A jammed finger is an injury to the ligaments that support the finger bones.  This injury happens when the ligaments are stretched beyond their normal range of motion (sprained).  Treatment may include a splint or buddy taping. This information is not intended to replace advice given to you by your health care provider. Make sure you discuss any questions you have with your health care provider. Document Revised: 03/26/2019 Document Reviewed: 09/16/2017 Elsevier Patient Education  2021 [employer].

## 2021-02-07 NOTE — Progress Notes (Signed)
Subjective: CC: DM PCP: Janora Norlander, DO WGN:[id number] F Kyland No. is a 70 y.o. male presenting to clinic today for:  1. Uncontrolled Type 2 Diabetes with hypertension, hyperlipidemia:  Patient noted to have uncontrolled diabetes despite compliance with zig duo, glyburide, Januvia.  He was continued on Lipitor and enalapril  He was instructed to follow-up with pharmacy for samples and further discussion of medications. After several attempts to reach patient, it appears that he was not able to make an appointment with her.    He is compliant with his medications.  No missed doses.  He admits to still drinking soda.  Last eye exam: needs Last foot exam: UTD Last A1c:  Lab Results  Component Value Date   HGBA1C 8.6 (H) 11/08/2020   Nephropathy screen indicated?: on ACE-I Last flu, zoster and/or pneumovax:  Immunization History  Administered Date(s) Administered  . Influenza Inj Mdck Quad Pf 10/06/2017  . Influenza, High Dose Seasonal PF 09/09/2018, 08/29/2019  . Influenza,inj,Quad PF,6+ Mos 10/11/2019  . Influenza-Unspecified 10/16/2016  . Moderna Sars-Covid-2 Vaccination 01/12/2020, 02/09/2020, 11/06/2020  . Pneumococcal Conjugate-13 10/08/2012, 10/11/2019  . Pneumococcal Polysaccharide-23 11/08/2020  . Tdap 06/15/2009, 11/12/2019  . Zoster 02/08/2013    ROS: No chest pain, shortness of breath, dizziness reported  2.  Right shoulder/back pain Patient reports that has been having some pain in the right upper back/shoulder area.  Symptoms are refractory to OTC analgesics.  No reports of weakness of the upper extremity.  He also jammed fingers on the right hand multiple times and they continue to hurt and swell.    ROS: Per HPI  Allergies  Allergen Reactions  . Sulfonamide Derivatives Swelling    SWELLING OF TONGUE  . Erythromycin Other (See Comments)    Patient unsure reaction   Past Medical History:  Diagnosis Date  . Allergy   . Anxiety states   .  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 Depressive disorder, not elsewhere classified   . DM (diabetes mellitus) ([place])   . Hemorrhage of rectum and anus   . Hyperlipidemia   . Hyperplasia of prostate   . Hypertension   . Polyp of colon     Current Outpatient Medications:  .  aspirin 81 MG EC tablet, Take 81 mg by mouth daily., Disp: , Rfl:  .  atorvastatin (LIPITOR) 20 MG tablet, Take 1 tablet (20 mg total) by mouth daily., Disp: 90 tablet, Rfl: 3 .  Blood Glucose Monitoring Suppl (ONETOUCH VERIO) w/Device KIT, 1 kit by Does not apply route daily., Disp: 1 kit, Rfl: 0 .  clomiPHENE (CLOMID) 50 MG tablet, Take 25 mg by mouth daily., Disp: , Rfl:  .  Continuous Blood Gluc Receiver (FREESTYLE LIBRE 2 READER) DEVI, USE TO TEST BLOOD SUGAR 6 TIMES DAILY AS DIRECTED., Disp: 1 each, Rfl: 2 .  Continuous Blood Gluc Sensor (FREESTYLE LIBRE 2 SENSOR) MISC, Use to test blood sugar 6 times daily as directed., Disp: 2 each, Rfl: 11 .  Dapagliflozin-metFORMIN HCl ER (XIGDUO XR) 04-999 MG TB24, Take 2 tablets by mouth daily., Disp: 180 tablet, Rfl: 3 .  enalapril (VASOTEC) 5 MG tablet, Take 1 tablet (5 mg total) by mouth daily., Disp: 90 tablet, Rfl: 3 .  fluticasone (FLONASE) 50 MCG/ACT nasal spray, SPRAY 2 SPRAYS INTO EACH NOSTRIL EVERY DAY, Disp: 48 mL, Rfl: 1 .  glucose blood (ONETOUCH VERIO) test strip, TEST BLOOD SUGAR TWICE A DAY Dx E11.9, Disp: 200 strip, Rfl: 3 .  glyBURIDE (  DIABETA) 2.5 MG tablet, TAKE 1 TABLET BY MOUTH DAILY WITH BREAKFAST., Disp: 90 tablet, Rfl: 0 .  JANUVIA 100 MG tablet, TAKE 1 TABLET BY MOUTH EVERY DAY, Disp: 30 tablet, Rfl: 2 .  OneTouch Delica Lancets 94W MISC, TEST BLOOD SUGAR TWICE A DAY Dx E11.9, Disp: 200 each, Rfl: 3 .  sildenafil (REVATIO) 20 MG tablet, TAKE 2-5 TABLETS AS NEEDED PRIOR TO SEXUAL ACTIVITY, Disp: 50 tablet, Rfl: 0 .  Vitamin D, Ergocalciferol, (DRISDOL) 1.25 MG (50000 UT) CAPS capsule, TAKE 1 CAPSULE BY MOUTH ONE TIME PER WEEK, Disp: 12 capsule,  Rfl: 3 Social History   Socioeconomic History  . Marital status: Married    Spouse name: Not on file  . Number of children: Not on file  . Years of education: Not on file  . Highest education level: Not on file  Occupational History  . Not on file  Tobacco Use  . Smoking status: Never Smoker  . Smokeless tobacco: Never Used  Vaping Use  . Vaping Use: Never used  Substance and Sexual Activity  . Alcohol use: No  . Drug use: No  . Sexual activity: Not on file  Other Topics Concern  . Not on file  Social History Narrative  . Not on file   Social Determinants of Health   Financial Resource Strain: Not on file  Food Insecurity: Not on file  Transportation Needs: Not on file  Physical Activity: Not on file  Stress: Not on file  Social Connections: Not on file  Intimate Partner Violence: Not on file   Family History  Problem Relation Age of Onset  . Heart disease Father 50       CABG  . Diabetes Brother   . Cancer Brother 27       brain tumor  . Colon cancer Paternal Uncle   . Stroke Maternal Grandmother   . Cancer Maternal Aunt        brain cancer  . Diabetes Brother   . Esophageal cancer Neg Hx   . Rectal cancer Neg Hx   . Stomach cancer Neg Hx     Objective: Office vital signs reviewed. BP 125/68   Pulse 81   Temp 98 F (36.7 C) (Temporal)   Ht 5' 10" (1.778 m)   Wt 239 lb 12.8 oz (108.8 kg)   SpO2 96%   BMI 34.41 kg/m   Physical Examination:  General: Awake, alert, well nourished, No acute distress HEENT: Normal; sclera white.  Moist mucous membranes Cardio: regular rate and rhythm, S1S2 heard, no murmurs appreciated Pulm: clear to auscultation bilaterally, no wheezes, rhonchi or rales; normal work of breathing on room air Extremities: warm, well perfused, No edema, cyanosis or clubbing; +2 pulses bilaterally MSK: Normal gait and station; mild tenderness palpation along the trapezius and rhomboid on the right.  There are no palpable bony  abnormalities.  No midline tenderness along the thoracic spine.  He has some soft tissue swelling noted along the third through fifth digits of the right hand.  No palpable bony abnormalities.  He is able to open and close the hand but does have some stiffness noted.  Assessment/ Plan: 70 y.o. male   Type 2 diabetes mellitus with other specified complication, without long-term current use of insulin ([place]) - Plan: Bayer DCA Hb A1c Waived, Semaglutide (RYBELSUS) 3 MG TABS  Hypertension associated with diabetes ([place])  Hyperlipidemia associated with type 2 diabetes mellitus ([place])  Spasm of thoracic back muscle - Plan: cyclobenzaprine (FLEXERIL)  10 MG tablet  Jammed interphalangeal joint of finger of right hand, initial encounter  Sugars not at goal but improving from previous checkup.  He is to discontinue Januvia.  He will continue Xigduo, glyburide.  He will start Rybelsus 3 mg daily.  No apparent contraindications.  Plan to follow-up to review sugars and escalate to 7 mg in approximately 4 weeks.  Patient was given 1 month of samples today.  Blood pressures controlled.  Continue current regimen Continue statin  I suspect thoracic muscle spasm.  He was prescribed Flexeril, encouraged to use heat.  I have also given him information for massage therapy in [place] to use ice on the jammed fingers.  Avoid further injury.   Orders Placed This Encounter  Procedures  . Bayer DCA Hb A1c Waived   Meds ordered this encounter  Medications  . Semaglutide (RYBELSUS) 14 MG TABS    Sig: Take 1 tablet by mouth daily before breakfast. START on Month 3    Dispense:  30 tablet    Refill:  prn  . Semaglutide (RYBELSUS) 7 MG TABS    Sig: Take 1 tablet by mouth daily before breakfast. Start after completing 83m month    Dispense:  30 tablet    Refill:  0  . Semaglutide (RYBELSUS) 3 MG TABS    Sig: Take 1 tablet by mouth daily before breakfast.    Dispense:  30 tablet    Refill:  0  .  cyclobenzaprine (FLEXERIL) 10 MG tablet    Sig: Take 1 tablet (10 mg total) by mouth 3 (three) times daily as needed for muscle spasms.    Dispense:  30 tablet    Refill:  0[place] DO WJulian([phone]

## 2021-03-06 ENCOUNTER — Other Ambulatory Visit: Payer: Self-pay | Admitting: Family Medicine

## 2021-03-09 ENCOUNTER — Other Ambulatory Visit: Payer: Self-pay | Admitting: Family Medicine

## 2021-03-09 ENCOUNTER — Encounter: Payer: Self-pay | Admitting: Family Medicine

## 2021-03-09 NOTE — Telephone Encounter (Signed)
Pt was given 3mg  during Feb visit and prescribed 7mg  for next dose.  He should not have completed his 7mg  yet.

## 2021-03-09 NOTE — Progress Notes (Signed)
error 

## 2021-03-29 ENCOUNTER — Other Ambulatory Visit: Payer: Self-pay

## 2021-03-29 ENCOUNTER — Emergency Department (HOSPITAL_COMMUNITY)
Admission: EM | Admit: 2021-03-29 | Discharge: 2021-03-30 | Disposition: A | Discharge status: Home / Self Care | Payer: BC Managed Care – PPO | Attending: Emergency Medicine | Admitting: Emergency Medicine

## 2021-03-29 ENCOUNTER — Encounter (HOSPITAL_COMMUNITY): Payer: Self-pay

## 2021-03-29 DIAGNOSIS — Z7984 Long term (current) use of oral hypoglycemic drugs: Secondary | ICD-10-CM | POA: Diagnosis not present

## 2021-03-29 DIAGNOSIS — E119 Type 2 diabetes mellitus without complications: Secondary | ICD-10-CM | POA: Diagnosis not present

## 2021-03-29 DIAGNOSIS — Z7982 Long term (current) use of aspirin: Secondary | ICD-10-CM | POA: Diagnosis not present

## 2021-03-29 DIAGNOSIS — Z96652 Presence of left artificial knee joint: Secondary | ICD-10-CM | POA: Insufficient documentation

## 2021-03-29 DIAGNOSIS — Z79899 Other long term (current) drug therapy: Secondary | ICD-10-CM | POA: Diagnosis not present

## 2021-03-29 DIAGNOSIS — H5711 Ocular pain, right eye: Secondary | ICD-10-CM | POA: Diagnosis not present

## 2021-03-29 DIAGNOSIS — I10 Essential (primary) hypertension: Secondary | ICD-10-CM | POA: Diagnosis not present

## 2021-03-29 DIAGNOSIS — H4311 Vitreous hemorrhage, right eye: Secondary | ICD-10-CM | POA: Insufficient documentation

## 2021-03-29 NOTE — ED Triage Notes (Addendum)
Emergency Medicine Provider Triage Evaluation Note  Richard Nelson , a 70 y.o. male  was evaluated in triage.  Pt complains of right-sided blurry vision.  Review of Systems  Positive: Right eye blurry vision Negative: Headache, facial droop, word slurring, eye pain  Physical Exam  BP (!) 144/78 (BP Location: Right Arm)   Pulse 89   Temp 99.1 F (37.3 C) (Oral)   Resp 16   Ht 5\' 10"  (1.778 m)   Wt 108.8 kg   SpO2 98%   BMI 34.42 kg/m  Gen:   Awake, no distress   HEENT:  Atraumatic, is intact, pupils equal and reactive Resp:  Normal effort  Cardiac:  Normal rate  Abd:   Nondistended, nontender  MSK:   Moves extremities without difficulty  Neuro:  Speech clear, no facial droop   Medical Decision Making  Medically screening exam initiated at 7:52 PM.  Appropriate orders placed.  Richard Nelson. was informed that the remainder of the evaluation will be completed by another provider, this initial triage assessment does not replace that evaluation, and the importance of remaining in the ED until their evaluation is complete.  Clinical Impression  70 year old male here with what he describes as a "worm" in his right thigh.  This has been ongoing since Tuesday.  He called his optometrist was concerned for possible retinal detachment and sent to the ER.  Denies any pain, but states that the blurry vision is constant.  Denies any facial droop, no cranial nerve deficits.  No screening labs indicated at this time.  Stable for further evaluation       Garald Balding, PA-C 03/29/21 1954

## 2021-03-29 NOTE — ED Triage Notes (Signed)
Patient reports vision change in R eye starting on Tuesday, states it feels like he is looking through a dirty window, called eye doctor who told him they were concerned for retinal detachment

## 2021-03-30 MED ORDER — FLUORESCEIN SODIUM 1 MG OP STRP: 1.0000 | ORAL_STRIP | Freq: Once | OPHTHALMIC | Status: DC

## 2021-03-30 NOTE — Consult Note (Signed)
Subjective: 69yo WM presents w/ floaters OD- started 03/26/21, no pain, no VF cuts or decreased vision, not improving since onset. No h/o eye surgeries/traumas though naturally myopic.  Objective: VAcc (+2.5D, near): OD 20/20-1, OS 20/20-1 IOP (palpation): NTP OU Pupils: 4-->27mm OU, ERRL OU, neg APD OU EOM: full OU CVF: full to CF OU Anterior segment: normal except for mild cataracts OU Dilation OU at 12:57am Dilated fundus exam:  vitreous- PVD w/ tr vitreous hemorrhage OD and clear OS Optic nerve- c/d 0.2 OU Macula flat OU Vessels normal OU Periphery attached OU (no tears/holes on 360 SDE OD) though w/ tr preretinal heme inferiorly OD  A/P: 1. Posterior vitreous detachment w/ mild vitreous hemorrhage OD- no tears/holes/retinal detachment on exam, +HTN/DM w/ mild VH, retinal detachment precautions given, f/u w/ me in clinic in 1wk for repeat exam given mild VH (sooner prn), pt to call our clinic next Monday 04/02/21 when office open for appointment at [place].  Alroy Dust, MD

## 2021-03-30 NOTE — ED Provider Notes (Signed)
[facility] Provider Note   CSN: [id number] Arrival date & time: 03/29/21  1915     History Chief Complaint  Patient presents with  . Eye Problem    Richard Nelson. is a 70 y.o. male.  The history is provided by the patient.  Eye Problem Location:  Right eye Onset quality:  Gradual Duration:  3 days Timing:  Constant Progression:  Worsening Chronicity:  New Context: not chemical exposure, not contact lens problem, not direct trauma, not foreign body and not scratch   Relieved by:  Nothing Worsened by:  Nothing Associated symptoms: blurred vision   Associated symptoms: no double vision, no headaches, no numbness, no vomiting and no weakness   Patient reports approximately 3 days ago he began noticing black specks in his vision and also wavy lines.  It is only in his right eye. No recent trauma.  He does not wear contact lenses no previous history of eye surgery.  No headache.  No eye pain.  No weakness or numbness in his arms or legs Denies previous history of stroke    Past Medical History:  Diagnosis Date  . Allergy   . Anxiety states   . 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 Depressive disorder, not elsewhere classified   . DM (diabetes mellitus) ([place])   . Hemorrhage of rectum and anus   . Hyperlipidemia   . Hyperplasia of prostate   . Hypertension   . Polyp of colon     Patient Active Problem List   Diagnosis Date Noted  . Educated about COVID-19 virus infection 04/13/2019  . Status post total knee replacement using cement, left 02/17/2019  . Erectile dysfunction due to arterial insufficiency 08/06/2017  . Hypertension associated with diabetes ([place]) 06/21/2017  . Conjunctivochalasis of both eyes 07/11/2016  . Vitreous syneresis of both eyes 07/11/2016  . Dermatochalasis of eyelids of both eyes 07/11/2016  . Combined form of senile cataract 07/09/2012  . Myopia with astigmatism and presbyopia 07/09/2012   . Anxiety states   . Depressive disorder, not elsewhere classified   . Hemorrhage of rectum and anus   . Polyp of colon   . Hyperplasia of prostate   . DM (diabetes mellitus) ([place])   . 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 Morbid obesity ([place]) 01/09/2011  . PRECORDIAL PAIN 01/09/2011  . Hyperlipidemia associated with type 2 diabetes mellitus ([place]) 01/07/2011  . TMJ PAIN 01/07/2011    Past Surgical History:  Procedure Laterality Date  . COLONOSCOPY    . CYST REMOVAL TRUNK    . KNEE SURGERY     LEFT  . POLYPECTOMY         Family History  Problem Relation Age of Onset  . Heart disease Father 32       CABG  . Diabetes Brother   . Cancer Brother 35       brain tumor  . Colon cancer Paternal Uncle   . Stroke Maternal Grandmother   . Cancer Maternal Aunt        brain cancer  . Diabetes Brother   . Esophageal cancer Neg Hx   . Rectal cancer Neg Hx   . Stomach cancer Neg Hx     Social History   Tobacco Use  . Smoking status: Never Smoker  . Smokeless tobacco: Never Used  Vaping Use  . Vaping Use: Never used  Substance Use Topics  . Alcohol use: No  .  Drug use: No    Home Medications Prior to Admission medications   Medication Sig Start Date End Date Taking? Authorizing Provider  aspirin 81 MG EC tablet Take 81 mg by mouth daily.    [provider]  atorvastatin (LIPITOR) 20 MG tablet Take 1 tablet (20 mg total) by mouth daily. 08/07/20   Janora Norlander, DO  Blood Glucose Monitoring Suppl (ONETOUCH VERIO) w/Device KIT 1 kit by Does not apply route daily. 02/08/19   Chipper Herb, MD  clomiPHENE (CLOMID) 50 MG tablet Take 25 mg by mouth daily. 07/07/20   [provider]  Continuous Blood Gluc Receiver (FREESTYLE LIBRE 2 READER) DEVI USE TO TEST BLOOD SUGAR 6 TIMES DAILY AS DIRECTED. 11/24/20   Ronnie Doss M, DO  Continuous Blood Gluc Sensor (FREESTYLE LIBRE 2 SENSOR) MISC Use to test blood sugar 6 times daily as directed. 08/24/20    Janora Norlander, DO  cyclobenzaprine (FLEXERIL) 10 MG tablet Take 1 tablet (10 mg total) by mouth 3 (three) times daily as needed for muscle spasms. 02/07/21   Janora Norlander, DO  Dapagliflozin-metFORMIN HCl ER (XIGDUO XR) 04-999 MG TB24 Take 2 tablets by mouth daily. 08/07/20   Ronnie Doss M, DO  enalapril (VASOTEC) 5 MG tablet Take 1 tablet (5 mg total) by mouth daily. 08/07/20   Ronnie Doss M, DO  fluticasone (FLONASE) 50 MCG/ACT nasal spray SPRAY 2 SPRAYS INTO EACH NOSTRIL EVERY DAY 06/21/19   Dettinger, Fransisca Kaufmann, MD  glucose blood (ONETOUCH VERIO) test strip TEST BLOOD SUGAR TWICE A DAY Dx E11.9 11/13/20   Ronnie Doss M, DO  glyBURIDE (DIABETA) 2.5 MG tablet TAKE 1 TABLET BY MOUTH DAILY WITH BREAKFAST. 12/25/20   Ronnie Doss M, DO  OneTouch Delica Lancets 79K MISC TEST BLOOD SUGAR TWICE A DAY Dx E11.9 11/13/20   Ronnie Doss M, DO  Semaglutide (RYBELSUS) 14 MG TABS Take 1 tablet by mouth daily before breakfast. START on Month 3 02/07/21   Ronnie Doss M, DO  Semaglutide (RYBELSUS) 3 MG TABS Take 1 tablet by mouth daily before breakfast. 02/07/21   Ronnie Doss M, DO  Semaglutide (RYBELSUS) 7 MG TABS Take 1 tablet by mouth daily before breakfast. Start after completing 60m month 02/07/21   GRonnie DossM, DO  sildenafil (REVATIO) 20 MG tablet TAKE 2-5 TABLETS AS NEEDED PRIOR TO SEXUAL ACTIVITY 10/11/19   GRonnie DossM, DO  Vitamin D, Ergocalciferol, (DRISDOL) 1.25 MG (50000 UT) CAPS capsule TAKE 1 CAPSULE BY MOUTH ONE TIME PER WEEK 06/01/19   MChipper Herb MD    Allergies    Sulfonamide derivatives and Erythromycin  Review of Systems   Review of Systems  Constitutional: Negative for fever.  Eyes: Positive for blurred vision and visual disturbance. Negative for double vision and pain.  Gastrointestinal: Negative for vomiting.  Neurological: Negative for weakness, numbness and headaches.  All other systems reviewed and are  negative.   Physical Exam Updated Vital Signs BP (!) 141/75 (BP Location: Right Arm)   Pulse 70   Temp 97.6 F (36.4 C) (Oral)   Resp 17   Ht 1.778 m ('5\' 10"' )   Wt 108.8 kg   SpO2 96%   BMI 34.42 kg/m   Physical Exam CONSTITUTIONAL: Well developed/well nourished HEAD: Normocephalic/atraumatic EYES: EOMI/PERRL, no nystagmus, no ptosis, no obvious foreign body, no corneal hazing, no conjunctival erythema, no proptosis ENMT: Mucous membranes moist NECK: supple no meningeal signs, no bruits SPINE/BACK:entire spine nontender CV: S1/S2 noted, no  murmurs/rubs/gallops noted LUNGS: Lungs are clear to auscultation bilaterally, no apparent distress ABDOMEN: soft, nontender, no rebound or guarding GU:no cva tenderness NEURO:Awake/alert, face symmetric, no arm or leg drift is noted Equal 5/5 strength with shoulder abduction, elbow flex/extension, wrist flex/extension in upper extremities and equal hand grips bilaterally Equal 5/5 strength with hip flexion,knee flex/extension, foot dorsi/plantar flexion Cranial nerves 3/4/5/6/06/23/09/11/12 tested and intact Sensation to light touch intact in all extremities EXTREMITIES: pulses normal, full ROM SKIN: warm, color normal PSYCH: no abnormalities of mood noted, alert and oriented to situation   ED Results / Procedures / Treatments   Labs (all labs ordered are listed, but only abnormal results are displayed) Labs Reviewed - No data to display  EKG None  Radiology No results found.  Procedures Procedures   Medications Ordered in ED Medications - No data to display  ED Course  I have reviewed the triage vital signs and the nursing notes.     MDM Rules/Calculators/A&P                          12:53 AM Discussed the case with Dr. Stacie Glaze with ophthalmology.  She will evaluate the patient in the emergency department.  Strong suspicion for retinal detachment   Seen by Dr. Stacie Glaze - he was found to have vitreous detachment No other  acute interventions, he can be followed on next week  Final Clinical Impression(s) / ED Diagnoses Final diagnoses:  Vitreous hemorrhage of right eye [facility])    Rx / DC Orders ED Discharge Orders    None       Ripley Fraise, MD 03/30/21 [phone]

## 2021-04-06 DIAGNOSIS — H43811 Vitreous degeneration, right eye: Secondary | ICD-10-CM | POA: Diagnosis not present

## 2021-04-08 ENCOUNTER — Other Ambulatory Visit: Payer: Self-pay | Admitting: Family Medicine

## 2021-04-09 ENCOUNTER — Other Ambulatory Visit: Payer: Self-pay | Admitting: Family Medicine

## 2021-04-09 ENCOUNTER — Encounter: Payer: Self-pay | Admitting: Family Medicine

## 2021-04-09 DIAGNOSIS — E119 Type 2 diabetes mellitus without complications: Secondary | ICD-10-CM

## 2021-05-18 DIAGNOSIS — H43811 Vitreous degeneration, right eye: Secondary | ICD-10-CM | POA: Diagnosis not present

## 2021-06-20 ENCOUNTER — Encounter: Payer: Self-pay | Admitting: Family Medicine

## 2021-07-03 ENCOUNTER — Other Ambulatory Visit: Payer: Self-pay | Admitting: Family Medicine

## 2021-07-03 DIAGNOSIS — I152 Hypertension secondary to endocrine disorders: Secondary | ICD-10-CM

## 2021-07-03 DIAGNOSIS — E785 Hyperlipidemia, unspecified: Secondary | ICD-10-CM

## 2021-07-03 DIAGNOSIS — E1159 Type 2 diabetes mellitus with other circulatory complications: Secondary | ICD-10-CM

## 2021-07-03 DIAGNOSIS — E1169 Type 2 diabetes mellitus with other specified complication: Secondary | ICD-10-CM

## 2021-07-06 ENCOUNTER — Telehealth: Payer: Self-pay | Admitting: *Deleted

## 2021-07-06 DIAGNOSIS — E1169 Type 2 diabetes mellitus with other specified complication: Secondary | ICD-10-CM

## 2021-07-06 NOTE — Telephone Encounter (Signed)
Richard Nelson   (Key: [id number]) Rybelsus '14MG'$  tablets  PA started  Sent to plan

## 2021-07-06 NOTE — Telephone Encounter (Signed)
[employer] NCPDP 2017 typically responds with questions in less than 15 minutes, but may take up to 24 hours.

## 2021-07-09 NOTE — Telephone Encounter (Signed)
Message from Plan PA Case: [id number], Status: Denied. Notification: Completed  Fax states denial reason : Medical Necessity  Almyra Free, can you help with this one?

## 2021-07-11 NOTE — Telephone Encounter (Signed)
PA resubmitted-->It was filled out incorrectly Patient has type 2 diabetes--not using for weight loss alone  f/u with outcome

## 2021-07-16 NOTE — Telephone Encounter (Signed)
Sent to plan.

## 2021-07-17 NOTE — Telephone Encounter (Signed)
Approved, Coverage Starts on: 07/11/2021 12:00:00 AM, Coverage Ends on: 07/11/2022 12:00:00 AM. [facility] aware

## 2021-07-24 ENCOUNTER — Other Ambulatory Visit: Payer: Self-pay | Admitting: Family Medicine

## 2021-07-24 DIAGNOSIS — E119 Type 2 diabetes mellitus without complications: Secondary | ICD-10-CM

## 2021-08-09 ENCOUNTER — Encounter: Payer: Self-pay | Admitting: Family Medicine

## 2021-08-15 ENCOUNTER — Encounter: Payer: Self-pay | Admitting: Family Medicine

## 2021-08-15 ENCOUNTER — Ambulatory Visit (INDEPENDENT_AMBULATORY_CARE_PROVIDER_SITE_OTHER): Payer: BC Managed Care – PPO | Admitting: Family Medicine

## 2021-08-15 ENCOUNTER — Other Ambulatory Visit: Payer: Self-pay

## 2021-08-15 ENCOUNTER — Telehealth: Payer: Self-pay | Admitting: Family Medicine

## 2021-08-15 VITALS — BP 107/63 | HR 90 | Temp 97.8°F | Resp 20 | Ht 70.0 in | Wt 234.0 lb

## 2021-08-15 DIAGNOSIS — E1159 Type 2 diabetes mellitus with other circulatory complications: Secondary | ICD-10-CM | POA: Diagnosis not present

## 2021-08-15 DIAGNOSIS — I152 Hypertension secondary to endocrine disorders: Secondary | ICD-10-CM

## 2021-08-15 DIAGNOSIS — E1169 Type 2 diabetes mellitus with other specified complication: Secondary | ICD-10-CM

## 2021-08-15 DIAGNOSIS — E1165 Type 2 diabetes mellitus with hyperglycemia: Secondary | ICD-10-CM | POA: Diagnosis not present

## 2021-08-15 DIAGNOSIS — M79605 Pain in left leg: Secondary | ICD-10-CM

## 2021-08-15 DIAGNOSIS — E785 Hyperlipidemia, unspecified: Secondary | ICD-10-CM

## 2021-08-15 DIAGNOSIS — M79604 Pain in right leg: Secondary | ICD-10-CM

## 2021-08-15 LAB — BAYER DCA HB A1C WAIVED: HB A1C (BAYER DCA - WAIVED): 8.2 % — ABNORMAL HIGH (ref <7.0)

## 2021-08-15 MED ORDER — RYBELSUS 14 MG PO TABS
1.0000 | ORAL_TABLET | Freq: Every day | ORAL | 3 refills | Status: DC
Start: 2021-08-15 — End: 2022-08-19

## 2021-08-15 MED ORDER — XIGDUO XR 5-1000 MG PO TB24: 1.0000 | ORAL_TABLET | Freq: Two times a day (BID) | ORAL | 3 refills | Status: DC

## 2021-08-15 MED ORDER — GLYBURIDE 5 MG PO TABS: 5.0000 mg | ORAL_TABLET | Freq: Every day | ORAL | 3 refills | Status: DC

## 2021-08-15 NOTE — Patient Instructions (Addendum)
A1c has gone up.  I'm concerned about this because we've escalated therapy.  I have increased your glyburide further to 5 mg but I want you to see Almyra Free in about 2 weeks for recheck.  We may simply have to consider insulin therapy at this point.

## 2021-08-15 NOTE — Progress Notes (Signed)
Subjective: CC:DM PCP: Janora Norlander, DO GYI:RSWNIOE F Ryshawn Sanzone. is a 70 y.o. male presenting to clinic today for:  1. Type 2 Diabetes with hypertension, hyperlipidemia:  Patient mitts that he does not follow a diet.  He eats foods like spaghetti and eats out due to his work schedule.  He has been seeing blood sugars anywhere between 130s and 160s when he checks it.  Had some issues with freestyle libre so did not continue utilizing that.  He is compliant with Xigduo 2 tablets daily of the 04-999 mg and DiaBeta 2.5 mg and Rybelsus 14 mg.  He has been on the increased dose of Rybelsus for about 2 to 3 months now.  Denies any GI side effects.  Certainly no hypoglycemia.  Denies any penile concerns including dysuria or penile discoloration or discharge.  Last eye exam: needs Last foot exam: 10/2020 Last A1c:  Lab Results  Component Value Date   HGBA1C 7.9 (H) 02/07/2021   Nephropathy screen indicated?: no Last flu, zoster and/or pneumovax:  Immunization History  Administered Date(s) Administered   Influenza Inj Mdck Quad Pf 10/06/2017   Influenza, High Dose Seasonal PF 09/09/2018, 08/29/2019   Influenza,inj,Quad PF,6+ Mos 10/11/2019   Influenza-Unspecified 10/16/2016, 09/15/2020   Moderna Sars-Covid-2 Vaccination 01/12/2020, 02/09/2020, 11/06/2020   Pneumococcal Conjugate-13 10/08/2012, 10/11/2019   Pneumococcal Polysaccharide-23 11/08/2020   Tdap 06/15/2009, 11/12/2019   Zoster, Live 02/08/2013   2.  Leg pain Patient reports some intermittent leg pain.  He is not sure if this is related to having changed vehicles to a smaller vehicle.  Does not report any substantial swelling.  No erythema or warmth.  No sensory or burning changes.  He does have chronic back issues.  Wanted to see if compression hose might be a good idea  ROS: Per HPI  Allergies  Allergen Reactions   Sulfonamide Derivatives Swelling    SWELLING OF TONGUE   Erythromycin Other (See Comments)    Patient  unsure reaction   Past Medical History:  Diagnosis Date   Allergy    Anxiety states    Decreased free testosterone level in male    Depressive disorder, not elsewhere classified    DM (diabetes mellitus) ([place])    Hemorrhage of rectum and anus    Hyperlipidemia    Hyperplasia of prostate    Hypertension    Polyp of colon     Current Outpatient Medications:    aspirin 81 MG EC tablet, Take 81 mg by mouth daily., Disp: , Rfl:    atorvastatin (LIPITOR) 20 MG tablet, Take 1 tablet (20 mg total) by mouth daily. (NEEDS TO BE SEEN BEFORE NEXT REFILL), Disp: 30 tablet, Rfl: 0   Blood Glucose Monitoring Suppl (ONETOUCH VERIO) w/Device KIT, 1 kit by Does not apply route daily., Disp: 1 kit, Rfl: 0   clomiPHENE (CLOMID) 50 MG tablet, Take 25 mg by mouth daily., Disp: , Rfl:    Continuous Blood Gluc Receiver (FREESTYLE LIBRE 2 READER) DEVI, USE TO TEST BLOOD SUGAR 6 TIMES DAILY AS DIRECTED., Disp: 1 each, Rfl: 2   Continuous Blood Gluc Sensor (FREESTYLE LIBRE 2 SENSOR) MISC, Use to test blood sugar 6 times daily as directed., Disp: 2 each, Rfl: 11   cyclobenzaprine (FLEXERIL) 10 MG tablet, Take 1 tablet (10 mg total) by mouth 3 (three) times daily as needed for muscle spasms., Disp: 30 tablet, Rfl: 0   Dapagliflozin-metFORMIN HCl ER (XIGDUO XR) 04-999 MG TB24, Take 2 tablets by mouth daily. (NEEDS  TO BE SEEN BEFORE NEXT REFILL), Disp: 60 tablet, Rfl: 0   enalapril (VASOTEC) 5 MG tablet, Take 1 tablet (5 mg total) by mouth daily. (NEEDS TO BE SEEN BEFORE NEXT REFILL), Disp: 30 tablet, Rfl: 0   fluticasone (FLONASE) 50 MCG/ACT nasal spray, SPRAY 2 SPRAYS INTO EACH NOSTRIL EVERY DAY, Disp: 48 mL, Rfl: 1   glucose blood (ONETOUCH VERIO) test strip, TEST BLOOD SUGAR TWICE A DAY Dx E11.9, Disp: 200 strip, Rfl: 3   glyBURIDE (DIABETA) 2.5 MG tablet, Take 1 tablet (2.5 mg total) by mouth daily with breakfast. (NEEDS TO BE SEEN BEFORE NEXT REFILL), Disp: 30 tablet, Rfl: 0   OneTouch Delica Lancets 46E MISC,  TEST BLOOD SUGAR TWICE A DAY Dx E11.9, Disp: 200 each, Rfl: 3   Semaglutide (RYBELSUS) 14 MG TABS, Take 1 tablet by mouth daily before breakfast. START on Month 3, Disp: 30 tablet, Rfl: prn   Semaglutide (RYBELSUS) 3 MG TABS, Take 1 tablet by mouth daily before breakfast., Disp: 30 tablet, Rfl: 0   Semaglutide (RYBELSUS) 7 MG TABS, Take 1 tablet by mouth daily before breakfast. Start after completing 69m month, Disp: 30 tablet, Rfl: 0   sildenafil (REVATIO) 20 MG tablet, TAKE 2-5 TABLETS AS NEEDED PRIOR TO SEXUAL ACTIVITY, Disp: 50 tablet, Rfl: 0   Vitamin D, Ergocalciferol, (DRISDOL) 1.25 MG (50000 UT) CAPS capsule, TAKE 1 CAPSULE BY MOUTH ONE TIME PER WEEK, Disp: 12 capsule, Rfl: 3 Social History   Socioeconomic History   Marital status: Married    Spouse name: Not on file   Number of children: Not on file   Years of education: Not on file   Highest education level: Not on file  Occupational History   Not on file  Tobacco Use   Smoking status: Never   Smokeless tobacco: Never  Vaping Use   Vaping Use: Never used  Substance and Sexual Activity   Alcohol use: No   Drug use: No   Sexual activity: Not on file  Other Topics Concern   Not on file  Social History Narrative   Not on file   Social Determinants of Health   Financial Resource Strain: Not on file  Food Insecurity: Not on file  Transportation Needs: Not on file  Physical Activity: Not on file  Stress: Not on file  Social Connections: Not on file  Intimate Partner Violence: Not on file   Family History  Problem Relation Age of Onset   Heart disease Father 73      CABG   Diabetes Brother    Cancer Brother 660      brain tumor   Colon cancer Paternal Uncle    Stroke Maternal Grandmother    Cancer Maternal Aunt        brain cancer   Diabetes Brother    Esophageal cancer Neg Hx    Rectal cancer Neg Hx    Stomach cancer Neg Hx     Objective: Office vital signs reviewed. BP 107/63   Pulse 90   Temp 97.8 F  (36.6 C)   Resp 20   Ht _0  (1.778 m)   Wt 234 lb (106.1 kg)   SpO2 93%   BMI 33.58 kg/m   Physical Examination:  General: Awake, alert, nontoxic, No acute distress HEENT: Normal; sclera white Cardio: regular rate and rhythm, S1S2 heard, no murmurs appreciated Pulm: clear to auscultation bilaterally, no wheezes, rhonchi or rales; normal work of breathing on room air MSK: Normal gait  and station.  Normal tone  Assessment/ Plan: 70 y.o. male   Uncontrolled type 2 diabetes mellitus with hyperglycemia (HCC) - Plan: Bayer DCA Hb A1c Waived, CBC with Differential/Platelet, Dapagliflozin-metFORMIN HCl ER (XIGDUO XR) 04-999 MG TB24, Semaglutide (RYBELSUS) 14 MG TABS, glyBURIDE (DIABETA) 5 MG tablet  Hypertension associated with diabetes ([place]) - Plan: CBC with Differential/Platelet, CMP14+EGFR  Hyperlipidemia associated with type 2 diabetes mellitus ([place]) - Plan: CBC with Differential/Platelet  Pain in both lower extremities - Plan: Compression stockings  A1c grossly uncontrolled with an increase to 8.2 today.  Quite confounding but I suspect that noncompliance with the diet is largely to blame.  I would like him to see Almyra Free in the next couple of weeks for recheck as I doubled his glyburide though I doubt that this will improve sugars substantially at this point.  I wonder if he might benefit from [facility] in place of the Rybelsus.  Alternatively we discussed insulin would really be the next step.  If we were to initiate insulin I would like to discontinue the glyburide.  He is due for diabetic eye exam.  Reinforced compliance with low-carb diet.  Blood pressure controlled.  Continue current regimen  Could check for peripheral artery disease but he has had normal pulses on exam.  We will give compression hose for now but if ongoing symptoms could consider ABIs given increased cardiovascular risks in the setting of uncontrolled diabetes  No orders of the defined types were placed in this  encounter.  No orders of the defined types were placed in this encounter.    Janora Norlander, DO [place] [phone]

## 2021-08-16 LAB — CMP14+EGFR
ALT: 19 IU/L (ref 0–44)
AST: 17 IU/L (ref 0–40)
Albumin/Globulin Ratio: 2.3 — ABNORMAL HIGH (ref 1.2–2.2)
Albumin: 4.4 g/dL (ref 3.8–4.8)
Alkaline Phosphatase: 73 IU/L (ref 44–121)
BUN/Creatinine Ratio: 9 — ABNORMAL LOW (ref 10–24)
BUN: 7 mg/dL — ABNORMAL LOW (ref 8–27)
Bilirubin Total: 0.4 mg/dL (ref 0.0–1.2)
CO2: 23 mmol/L (ref 20–29)
Calcium: 9.3 mg/dL (ref 8.6–10.2)
Chloride: 102 mmol/L (ref 96–106)
Creatinine, Ser: 0.77 mg/dL (ref 0.76–1.27)
Globulin, Total: 1.9 g/dL (ref 1.5–4.5)
Glucose: 132 mg/dL — ABNORMAL HIGH (ref 65–99)
Potassium: 3.9 mmol/L (ref 3.5–5.2)
Sodium: 140 mmol/L (ref 134–144)
Total Protein: 6.3 g/dL (ref 6.0–8.5)
eGFR: 98 mL/min/{1.73_m2} (ref >59)

## 2021-08-16 LAB — CBC WITH DIFFERENTIAL/PLATELET
Basophils Absolute: 0 10*3/uL (ref 0.0–0.2)
Basos: 0 %
EOS (ABSOLUTE): 0.1 10*3/uL (ref 0.0–0.4)
Eos: 2 %
Hematocrit: 46.2 % (ref 37.5–51.0)
Hemoglobin: 15.3 g/dL (ref 13.0–17.7)
Immature Grans (Abs): 0 10*3/uL (ref 0.0–0.1)
Immature Granulocytes: 0 %
Lymphocytes Absolute: 1.9 10*3/uL (ref 0.7–3.1)
Lymphs: 24 %
MCH: 26.9 pg (ref 26.6–33.0)
MCHC: 33.1 g/dL (ref 31.5–35.7)
MCV: 81 fL (ref 79–97)
Monocytes Absolute: 0.5 10*3/uL (ref 0.1–0.9)
Monocytes: 7 %
Neutrophils Absolute: 5.2 10*3/uL (ref 1.4–7.0)
Neutrophils: 67 %
Platelets: 220 10*3/uL (ref 150–450)
RBC: 5.69 x10E6/uL (ref 4.14–5.80)
RDW: 12.8 % (ref 11.6–15.4)
WBC: 7.8 10*3/uL (ref 3.4–10.8)

## 2021-08-16 NOTE — Telephone Encounter (Signed)
Please schedule patient.

## 2021-08-16 NOTE — Telephone Encounter (Signed)
Appt made

## 2021-08-17 ENCOUNTER — Telehealth: Payer: Self-pay

## 2021-08-17 NOTE — Telephone Encounter (Signed)
Xigduo approved. Pharmacy notified    Key: [id number] - PA Case ID: [id number] - Rx #: [id number] Need help?   Call us at [phone]  Outcome  Approved today  PA Case: [id number], Status: Approved, Coverage Starts on: 08/17/2021 12:00:00 AM, Coverage Ends on: 08/17/2022 12:00:00 AM.  Drug  Xigduo XR 5-'1000MG'$  er tablets  Investment banker, operational PA Form [phone] NCPDP) Original Claim Info [phone] USE GENERIC [place]

## 2021-08-17 NOTE — Telephone Encounter (Signed)
Key: [id number] - PA Case ID: [id number] -  Rx #[id number] Need help? Call us at [phone]  Status  Sent to [place] find matching patient  Drug  Xigduo XR 5-'1000MG'$  er tablets  Investment banker, operational PA Form [phone] NCPDP) Original Claim Info [phone] USE GENERIC [place]

## 2021-08-23 ENCOUNTER — Other Ambulatory Visit: Payer: Self-pay | Admitting: Family Medicine

## 2021-08-23 DIAGNOSIS — E119 Type 2 diabetes mellitus without complications: Secondary | ICD-10-CM

## 2021-08-29 ENCOUNTER — Other Ambulatory Visit: Payer: Self-pay | Admitting: Family Medicine

## 2021-09-04 LAB — HM DIABETES EYE EXAM

## 2021-09-06 ENCOUNTER — Other Ambulatory Visit: Payer: Self-pay | Admitting: Family Medicine

## 2021-09-06 DIAGNOSIS — E1165 Type 2 diabetes mellitus with hyperglycemia: Secondary | ICD-10-CM

## 2021-09-06 MED ORDER — GLYBURIDE 5 MG PO TABS: 5.0000 mg | ORAL_TABLET | Freq: Every day | ORAL | 3 refills | Status: DC

## 2021-09-11 ENCOUNTER — Ambulatory Visit (INDEPENDENT_AMBULATORY_CARE_PROVIDER_SITE_OTHER): Payer: BC Managed Care – PPO | Admitting: Pharmacist

## 2021-09-11 DIAGNOSIS — E785 Hyperlipidemia, unspecified: Secondary | ICD-10-CM | POA: Diagnosis not present

## 2021-09-11 DIAGNOSIS — E1169 Type 2 diabetes mellitus with other specified complication: Secondary | ICD-10-CM

## 2021-09-11 DIAGNOSIS — E1165 Type 2 diabetes mellitus with hyperglycemia: Secondary | ICD-10-CM

## 2021-09-11 MED ORDER — ATORVASTATIN CALCIUM 20 MG PO TABS: 20.0000 mg | ORAL_TABLET | Freq: Every day | ORAL | 0 refills | Status: DC

## 2021-09-11 NOTE — Progress Notes (Signed)
    09/11/2021 Name: Richard Nelson. MRN: [id number] DOB: [birth year]   S:  13 YOM Presents for diabetes evaluation, education, and management Patient was referred and last seen by Primary Care Provider on 08/15/21.  Insurance coverage/medication affordability: BCBS state health plan  Patient reports adherence with medications. Current diabetes medications include: rybelsus, xigduo, glyburide Current hypertension medications include: enalapril Goal 130/80 Current hyperlipidemia medications include: atorvastatin   Patient denies hypoglycemic events.   Patient reported dietary habits: Eats 2-3 meals/day EATS ON THE GO A LOT--BUSY WORK SCHEDULE LIBRE IS HELPING HIM MAKE BETTER DECISION BY SEEING HOW HIS BG IS AFFECTED BY FOODS  O:  Lab Results  Component Value Date   HGBA1C 8.2 (H) 08/15/2021   Lipid Panel     Component Value Date/Time   CHOL 121 10/11/2019 1632   TRIG 117 10/11/2019 1632   TRIG 120 08/29/2017 1018   HDL 38 (L) 10/11/2019 1632   HDL 42 08/29/2017 1018   CHOLHDL 3.2 10/11/2019 1632   LDLCALC 62 10/11/2019 1632   LDLCALC 83 02/07/2014 0913   LDLDIRECT 62 11/08/2020 1612     Home fasting blood sugars: USING LIBRE 2 CGM AGAIN AND IS HELPING  Libre CGM  Last 7 days avg157 Last 14 DATS168 12P-6pm 146, 159 6pm-12am 147, 153 6am-12 174, 182 Eats DINNER LATE at 9pm--SOMETIMES REFLECTS HIGHER FBG  Clinical Atherosclerotic Cardiovascular Disease (ASCVD): No   The ASCVD Risk score (Arnett DK, et al., 2019) failed to calculate for the following reasons:   The valid total cholesterol range is 130 to 320 mg/dL   A/P:  Diabetes T2DM currently UNCONTROLLED, HOWEVER PATIENT APPEARS TO BE IMPROVING WITH RECENT INCREASE IN RYBELSUS TO 14MG  DAILY AND USE OF LIBRE 2 CGM.  Patient is adherent with medication. Control is suboptimal due to DIET/LIFESTYLE--IMPROVING.  -Consider Mounjaro after he finishes rybelsus supply--discussed  -Continue rybelsus 14mg  daily    Denies personal and family history of Medullary thyroid cancer (MTC)  Denies side effects  -Continue Xigduo-->consider increase to 10/1000mg   -Would like to d/c glyburide--patient would like to finish his supply-->we will address at follow up  -Extensively discussed pathophysiology of diabetes, recommended lifestyle interventions, dietary effects on blood sugar control  -Counseled on s/sx of and management of hypoglycemia  -Next A1C anticipated 10/2021.   Written patient instructions provided.  Total time in face to face counseling 30 minutes.    Regina Eck, PharmD, BCPS Clinical Pharmacist, [place]  II Phone [phone]

## 2021-09-19 ENCOUNTER — Encounter: Payer: Self-pay | Admitting: Family Medicine

## 2021-09-27 ENCOUNTER — Other Ambulatory Visit: Payer: Self-pay | Admitting: Family Medicine

## 2021-09-27 DIAGNOSIS — I152 Hypertension secondary to endocrine disorders: Secondary | ICD-10-CM

## 2021-09-27 DIAGNOSIS — E1159 Type 2 diabetes mellitus with other circulatory complications: Secondary | ICD-10-CM

## 2021-11-11 ENCOUNTER — Other Ambulatory Visit: Payer: Self-pay | Admitting: Family Medicine

## 2021-11-11 DIAGNOSIS — I152 Hypertension secondary to endocrine disorders: Secondary | ICD-10-CM

## 2021-11-11 DIAGNOSIS — E119 Type 2 diabetes mellitus without complications: Secondary | ICD-10-CM

## 2021-11-16 ENCOUNTER — Encounter: Payer: Self-pay | Admitting: Family Medicine

## 2021-11-16 ENCOUNTER — Ambulatory Visit (INDEPENDENT_AMBULATORY_CARE_PROVIDER_SITE_OTHER): Payer: BC Managed Care – PPO | Admitting: Family Medicine

## 2021-11-16 VITALS — BP 104/62 | HR 87 | Temp 97.4°F | Ht 70.0 in | Wt 228.0 lb

## 2021-11-16 DIAGNOSIS — I152 Hypertension secondary to endocrine disorders: Secondary | ICD-10-CM

## 2021-11-16 DIAGNOSIS — E785 Hyperlipidemia, unspecified: Secondary | ICD-10-CM

## 2021-11-16 DIAGNOSIS — E1159 Type 2 diabetes mellitus with other circulatory complications: Secondary | ICD-10-CM

## 2021-11-16 DIAGNOSIS — E1169 Type 2 diabetes mellitus with other specified complication: Secondary | ICD-10-CM

## 2021-11-16 MED ORDER — FREESTYLE LIBRE 3 SENSOR MISC: 1.0000 | 3 refills | Status: DC

## 2021-11-16 MED ORDER — FLUTICASONE PROPIONATE 50 MCG/ACT NA SUSP: NASAL | 1 refills | Status: DC

## 2021-11-16 NOTE — Progress Notes (Signed)
Subjective: CC:DM PCP: Janora Norlander, DO WOE:HOZYYQM F Mirko Tailor. is a 70 y.o. male presenting to clinic today for:  1. Type 2 Diabetes with hypertension, hyperlipidemia:  Patient reports that he has been compliant with his medications.  He has noticed a little bit of weight loss from the medication.  No reports of nausea, vomiting or abdominal pain.  Continues to not really restrict the things that he eats but does try to have smaller amounts of them.  No reports of genitourinary concerns.  No chest pain, shortness of breath.  Last eye exam: Up-to-date Last foot exam: Needs Last A1c:  Lab Results  Component Value Date   HGBA1C 8.2 (H) 08/15/2021   Nephropathy screen indicated?:  Up-to-date Last flu, zoster and/or pneumovax:  Immunization History  Administered Date(s) Administered   Influenza Inj Mdck Quad Pf 10/06/2017   Influenza, High Dose Seasonal PF 09/09/2018, 08/29/2019   Influenza,inj,Quad PF,6+ Mos 10/11/2019   Influenza-Unspecified 10/16/2016, 09/15/2020   Moderna Sars-Covid-2 Vaccination 01/12/2020, 02/09/2020, 11/06/2020   Pneumococcal Conjugate-13 10/08/2012, 10/11/2019   Pneumococcal Polysaccharide-23 11/08/2020   Tdap 06/15/2009, 11/12/2019   Zoster, Live 02/08/2013   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    Depressive disorder, not elsewhere classified    DM (diabetes mellitus) ([place])    Hemorrhage of rectum and anus    Hyperlipidemia    Hyperplasia of prostate    Hypertension    Polyp of colon     Current Outpatient Medications:    aspirin 81 MG EC tablet, Take 81 mg by mouth daily., Disp: , Rfl:    atorvastatin (LIPITOR) 20 MG tablet, Take 1 tablet (20 mg total) by mouth daily., Disp: 90 tablet, Rfl:  0   Blood Glucose Monitoring Suppl (ONETOUCH VERIO) w/Device KIT, 1 kit by Does not apply route daily., Disp: 1 kit, Rfl: 0   clomiPHENE (CLOMID) 50 MG tablet, Take 25 mg by mouth daily., Disp: , Rfl:    Continuous Blood Gluc Receiver (FREESTYLE LIBRE 2 READER) DEVI, USE TO TEST BLOOD SUGAR 6 TIMES DAILY AS DIRECTED., Disp: 1 each, Rfl: 2   Continuous Blood Gluc Sensor (FREESTYLE LIBRE 2 SENSOR) MISC, USE TO TEST BLOOD SUGAR 6 TIMES DAILY AS DIRECTED., Disp: 2 each, Rfl: 11   Dapagliflozin-metFORMIN HCl ER (XIGDUO XR) 04-999 MG TB24, Take 1 tablet by mouth in the morning and at bedtime., Disp: 180 tablet, Rfl: 3   enalapril (VASOTEC) 5 MG tablet, TAKE 1 TABLET (5 MG TOTAL) BY MOUTH DAILY., Disp: 90 tablet, Rfl: 0   glucose blood (ONETOUCH VERIO) test strip, TEST BLOOD SUGAR TWICE A DAY Dx E11.9, Disp: 200 strip, Rfl: 3   glyBURIDE (DIABETA) 5 MG tablet, Take 1 tablet (5 mg total) by mouth daily with breakfast., Disp: 90 tablet, Rfl: 3   OneTouch Delica Lancets 25O MISC, TEST BLOOD SUGAR TWICE A DAY Dx E11.9, Disp: 200 each, Rfl: 3   Semaglutide (RYBELSUS) 14 MG TABS, Take 1 tablet by mouth daily before breakfast., Disp: 90 tablet, Rfl: 3   sildenafil (REVATIO) 20 MG tablet, TAKE 2-5 TABLETS AS NEEDED PRIOR TO SEXUAL ACTIVITY, Disp: 50 tablet, Rfl: 0   fluticasone (FLONASE) 50 MCG/ACT nasal spray, SPRAY 2 SPRAYS INTO  EACH NOSTRIL EVERY DAY, Disp: 48 mL, Rfl: 1 Social History   Socioeconomic History   Marital status: Married    Spouse name: Not on file   Number of children: Not on file   Years of education: Not on file   Highest education level: Not on file  Occupational History   Not on file  Tobacco Use   Smoking status: Never   Smokeless tobacco: Never  Vaping Use   Vaping Use: Never used  Substance and Sexual Activity   Alcohol use: No   Drug use: No   Sexual activity: Not on file  Other Topics Concern   Not on file  Social History Narrative   Not on file   Social Determinants  of Health   Financial Resource Strain: Not on file  Food Insecurity: Not on file  Transportation Needs: Not on file  Physical Activity: Not on file  Stress: Not on file  Social Connections: Not on file  Intimate Partner Violence: Not on file   Family History  Problem Relation Age of Onset   Heart disease Father 34       CABG   Diabetes Brother    Cancer Brother 50       brain tumor   Colon cancer Paternal Uncle    Stroke Maternal Grandmother    Cancer Maternal Aunt        brain cancer   Diabetes Brother    Esophageal cancer Neg Hx    Rectal cancer Neg Hx    Stomach cancer Neg Hx     Objective: Office vital signs reviewed. BP 104/62   Pulse 87   Temp (!) 97.4 F (36.3 C)   Ht '5\' 10"'  (1.778 m)   Wt 228 lb (103.4 kg)   SpO2 96%   BMI 32.71 kg/m   Physical Examination:  General: Awake, alert, well nourished, No acute distress HEENT: Sclera white.  Moist mucous membranes Cardio: regular rate and rhythm  Pulm: normal work of breathing on room air   Assessment/ Plan: 70 y.o. male   Controlled type 2 diabetes mellitus with other specified complication, without long-term current use of insulin ([place]) - Plan: Lipid panel, Bayer DCA Hb A1c Waived, Continuous Blood Gluc Sensor (FREESTYLE LIBRE 3 SENSOR) MISC  Hyperlipidemia associated with type 2 diabetes mellitus ([place])  Hypertension associated with diabetes ([place])  Sugar now controlled.  He would like to try and switch over to a more dependable libre.  I have given him a sample of the freestyle libre 3 as well as sent in a prescription to replace his libre 2.  He may follow-up in 4 months, sooner if concerns arise.  Plan for diabetic foot exam at next visit  Nonfasting lipid panel obtained today  Blood pressure well controlled.  No changes  Orders Placed This Encounter  Procedures   Lipid panel   Bayer DCA Hb A1c Waived   Meds ordered this encounter  Medications   fluticasone (FLONASE) 50 MCG/ACT nasal spray     Sig: SPRAY 2 SPRAYS INTO EACH NOSTRIL EVERY DAY    Dispense:  48 mL    Refill:  [place], DO [place] [id number]

## 2021-11-17 LAB — LIPID PANEL
Chol/HDL Ratio: 2.7 ratio (ref 0.0–5.0)
Cholesterol, Total: 100 mg/dL (ref 100–199)
HDL: 37 mg/dL — ABNORMAL LOW (ref >39)
LDL Chol Calc (NIH): 44 mg/dL (ref 0–99)
Triglycerides: 101 mg/dL (ref 0–149)
VLDL Cholesterol Cal: 19 mg/dL (ref 5–40)

## 2021-11-19 LAB — BAYER DCA HB A1C WAIVED: HB A1C (BAYER DCA - WAIVED): 6.7 % — ABNORMAL HIGH (ref 4.8–5.6)

## 2021-11-24 ENCOUNTER — Other Ambulatory Visit: Payer: Self-pay | Admitting: Family Medicine

## 2021-11-24 ENCOUNTER — Encounter: Payer: Self-pay | Admitting: Family Medicine

## 2021-11-24 DIAGNOSIS — E1169 Type 2 diabetes mellitus with other specified complication: Secondary | ICD-10-CM

## 2021-11-27 ENCOUNTER — Other Ambulatory Visit: Payer: Self-pay | Admitting: Family Medicine

## 2021-11-27 DIAGNOSIS — E1169 Type 2 diabetes mellitus with other specified complication: Secondary | ICD-10-CM

## 2021-12-05 NOTE — Telephone Encounter (Signed)
Key: [place] to Plan today Drug FreeStyle Libre 3 Sensor  (May not cover - no insulin use for this pt- was a question)

## 2021-12-05 NOTE — Telephone Encounter (Signed)
Denied today Your PA request has been denied. Additional information will be provided in the denial communication. (Message [phone])    You do not meet the requirements of your plan. Your plan covers a continuous glucose monitor when you are using an intensive insulin regimen. Your request has been denied based on the information we have

## 2021-12-20 ENCOUNTER — Encounter: Payer: Self-pay | Admitting: Family Medicine

## 2021-12-20 ENCOUNTER — Other Ambulatory Visit: Payer: Self-pay | Admitting: Family Medicine

## 2021-12-20 DIAGNOSIS — E119 Type 2 diabetes mellitus without complications: Secondary | ICD-10-CM

## 2021-12-21 ENCOUNTER — Telehealth: Payer: Self-pay | Admitting: *Deleted

## 2021-12-21 DIAGNOSIS — E785 Hyperlipidemia, unspecified: Secondary | ICD-10-CM

## 2021-12-21 DIAGNOSIS — E1169 Type 2 diabetes mellitus with other specified complication: Secondary | ICD-10-CM

## 2021-12-21 MED ORDER — FREESTYLE LIBRE 2 SENSOR MISC: 10 refills | Status: DC

## 2021-12-21 NOTE — Telephone Encounter (Signed)
Key: [id number] - PA Case ID: [id number] - Rx #: [id number]  Drug FreeStyle Libre 2 Sensor Form [employer] Electronic PA Form [phone] NCPDP) Original Claim Info [phone]  Sent to plan today

## 2021-12-21 NOTE — Telephone Encounter (Signed)
PA Case: [id number], Status: Denied. Notification: Completed.

## 2021-12-24 NOTE — Telephone Encounter (Signed)
PA was denied for libre 2 and 3

## 2021-12-24 NOTE — Telephone Encounter (Signed)
Can you help?

## 2021-12-27 NOTE — Telephone Encounter (Signed)
Unsure why denied this time? Resubmitted I will follow

## 2021-12-28 NOTE — Telephone Encounter (Signed)
Pt aware of julies note

## 2021-12-28 NOTE — Telephone Encounter (Signed)
PA re-submitted, however insurance is still not covering  His plan terms may have changed this year.  His insurance response: we may be able to approve this device in certain situations (I/e/ require insulin injections)  Let patient know he can purchase at the pharmacy for $74.99/2 sensors/1 month supply

## 2022-03-15 ENCOUNTER — Encounter: Payer: Self-pay | Admitting: Family Medicine

## 2022-03-21 ENCOUNTER — Other Ambulatory Visit: Payer: Self-pay | Admitting: Family Medicine

## 2022-03-21 DIAGNOSIS — I152 Hypertension secondary to endocrine disorders: Secondary | ICD-10-CM

## 2022-03-25 ENCOUNTER — Encounter: Payer: Self-pay | Admitting: Family Medicine

## 2022-03-25 ENCOUNTER — Ambulatory Visit (INDEPENDENT_AMBULATORY_CARE_PROVIDER_SITE_OTHER): Payer: BC Managed Care – PPO | Admitting: Family Medicine

## 2022-03-25 VITALS — BP 150/78 | HR 79 | Temp 98.0°F | Ht 70.0 in | Wt 234.6 lb

## 2022-03-25 DIAGNOSIS — E1159 Type 2 diabetes mellitus with other circulatory complications: Secondary | ICD-10-CM

## 2022-03-25 DIAGNOSIS — I152 Hypertension secondary to endocrine disorders: Secondary | ICD-10-CM

## 2022-03-25 DIAGNOSIS — E785 Hyperlipidemia, unspecified: Secondary | ICD-10-CM

## 2022-03-25 DIAGNOSIS — F329 Major depressive disorder, single episode, unspecified: Secondary | ICD-10-CM | POA: Diagnosis not present

## 2022-03-25 DIAGNOSIS — E1169 Type 2 diabetes mellitus with other specified complication: Secondary | ICD-10-CM

## 2022-03-25 LAB — BAYER DCA HB A1C WAIVED: HB A1C (BAYER DCA - WAIVED): 7.7 % — ABNORMAL HIGH (ref 4.8–5.6)

## 2022-03-25 MED ORDER — BUPROPION HCL ER (SR) 150 MG PO TB12: 150.0000 mg | ORAL_TABLET | Freq: Every day | ORAL | 0 refills | Status: DC

## 2022-03-25 NOTE — Progress Notes (Signed)
? ?Subjective: ?CC:DM ?PCP: Janora Norlander, DO ?TTS:[id number] F Devun Anna. is a 71 y.o. male presenting to clinic today for: ? ?1. Type 2 Diabetes with hypertension, hyperlipidemia:  ?Patient reports compliance with his medications but admits that he has not been following a strict diet.  He admits to emotional eating, particularly when he is stressed out.  He is worried that his A1c is can be well over 8 this time.  Does not report any blurred vision, excessive urination or thirst. ? ?Last eye exam: Up-to-date ?Last foot exam: Needs ?Last A1c:  ?Lab Results  ?Component Value Date  ? HGBA1C 6.7 (H) 11/16/2021  ? ?Nephropathy screen indicated?:  On ACE inhibitor ?Last flu, zoster and/or pneumovax:  ?Immunization History  ?Administered Date(s) Administered  ? Influenza Inj Mdck Quad Pf 10/06/2017  ? Influenza, High Dose Seasonal PF 09/09/2018, 08/29/2019  ? Influenza,inj,Quad PF,6+ Mos 10/11/2019  ? Influenza-Unspecified 10/16/2016, 09/15/2020  ? Moderna Sars-Covid-2 Vaccination 01/12/2020, 02/09/2020, 11/06/2020  ? Pneumococcal Conjugate-13 10/08/2012, 10/11/2019  ? Pneumococcal Polysaccharide-23 11/08/2020  ? Tdap 06/15/2009, 11/12/2019  ? Zoster, Live 02/08/2013  ? ? ?2.  Stress ?Patient admits that he has not been dealing with his stress in the most healthy manner.  He does crave sweets, particularly when he is stressed out.  His wife has been hospitalized almost on a monthly basis.  She apparently has been spending money without discussing these items with him first. ? ? ?ROS: Per HPI ? ?Allergies  ?Allergen Reactions  ? Sulfonamide Derivatives Swelling  ?  SWELLING OF TONGUE  ? Erythromycin Other (See Comments)  ?  Patient unsure reaction  ? ?Past Medical History:  ?Diagnosis Date  ? Allergy   ? Anxiety states   ? 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 Depressive disorder, not elsewhere classified   ? DM (diabetes mellitus) ([place])   ? Hemorrhage of rectum and anus   ? Hyperlipidemia   ? Hyperplasia of  prostate   ? Hypertension   ? Polyp of colon   ? ? ?Current Outpatient Medications:  ?  aspirin 81 MG EC tablet, Take 81 mg by mouth daily., Disp: , Rfl:  ?  atorvastatin (LIPITOR) 20 MG tablet, TAKE 1 TABLET BY MOUTH EVERY DAY, Disp: 90 tablet, Rfl: 1 ?  Blood Glucose Monitoring Suppl (ONETOUCH VERIO) w/Device KIT, 1 kit by Does not apply route daily., Disp: 1 kit, Rfl: 0 ?  Continuous Blood Gluc Sensor (FREESTYLE LIBRE 2 SENSOR) MISC, Use to check blood sugar continuously, Disp: 2 each, Rfl: 10 ?  Continuous Blood Gluc Sensor (FREESTYLE LIBRE 3 SENSOR) MISC, 1 Device by Does not apply route every 14 (fourteen) days. to check glucose continuously. E11.9, Disp: 6 each, Rfl: 3 ?  Dapagliflozin-metFORMIN HCl ER (XIGDUO XR) 04-999 MG TB24, Take 1 tablet by mouth in the morning and at bedtime., Disp: 180 tablet, Rfl: 3 ?  enalapril (VASOTEC) 5 MG tablet, TAKE 1 TABLET (5 MG TOTAL) BY MOUTH DAILY., Disp: 90 tablet, Rfl: 0 ?  fluticasone (FLONASE) 50 MCG/ACT nasal spray, SPRAY 2 SPRAYS INTO EACH NOSTRIL EVERY DAY, Disp: 48 mL, Rfl: 1 ?  glucose blood (ONETOUCH VERIO) test strip, TEST BLOOD SUGAR TWICE A DAY Dx E11.9, Disp: 200 strip, Rfl: 3 ?  glyBURIDE (DIABETA) 5 MG tablet, Take 1 tablet (5 mg total) by mouth daily with breakfast., Disp: 90 tablet, Rfl: 3 ?  OneTouch Delica Lancets 30S MISC, TEST BLOOD SUGAR TWICE A DAY Dx E11.9, Disp: 200 each, Rfl:  3 ?  Semaglutide (RYBELSUS) 14 MG TABS, Take 1 tablet by mouth daily before breakfast., Disp: 90 tablet, Rfl: 3 ?  sildenafil (REVATIO) 20 MG tablet, TAKE 2-5 TABLETS AS NEEDED PRIOR TO SEXUAL ACTIVITY, Disp: 50 tablet, Rfl: 0 ?  clomiPHENE (CLOMID) 50 MG tablet, Take 25 mg by mouth daily. (Patient not taking: Reported on 03/25/2022), Disp: , Rfl:  ?Social History  ? ?Socioeconomic History  ? Marital status: Married  ?  Spouse name: Not on file  ? Number of children: Not on file  ? Years of education: Not on file  ? Highest education level: Not on file  ?Occupational History   ? Not on file  ?Tobacco Use  ? Smoking status: Never  ? Smokeless tobacco: Never  ?Vaping Use  ? Vaping Use: Never used  ?Substance and Sexual Activity  ? Alcohol use: No  ? Drug use: No  ? Sexual activity: Not on file  ?Other Topics Concern  ? Not on file  ?Social History Narrative  ? Not on file  ? ?Social Determinants of Health  ? ?Financial Resource Strain: Not on file  ?Food Insecurity: Not on file  ?Transportation Needs: Not on file  ?Physical Activity: Not on file  ?Stress: Not on file  ?Social Connections: Not on file  ?Intimate Partner Violence: Not on file  ? ?Family History  ?Problem Relation Age of Onset  ? Heart disease Father 20  ?     CABG  ? Diabetes Brother   ? Cancer Brother 2  ?     brain tumor  ? Colon cancer Paternal Uncle   ? Stroke Maternal Grandmother   ? Cancer Maternal Aunt   ?     brain cancer  ? Diabetes Brother   ? Esophageal cancer Neg Hx   ? Rectal cancer Neg Hx   ? Stomach cancer Neg Hx   ? ? ?Objective: ?Office vital signs reviewed. ?BP (!) 150/78   Pulse 79   Temp 98 ?F (36.7 ?C)   Ht _0  (1.778 m)   Wt 234 lb 9.6 oz (106.4 kg)   SpO2 96%   BMI 33.66 kg/m?  ? ?Physical Examination:  ?General: Awake, alert, well nourished, No acute distress ?HEENT: Sclera white.  Moist mucous membranes ?Cardio: regular rate and rhythm  ?Pulm:  normal work of breathing on room air ?Psych: Stressed appearing ? ? ?  03/25/2022  ?  4:42 PM 08/15/2021  ?  4:22 PM 02/07/2021  ?  4:22 PM  ?Depression screen PHQ 2/9  ?Decreased Interest 1 0 1  ?Down, Depressed, Hopeless _1 ?PHQ - 2 Score _2 ?Altered sleeping 1 0 1  ?Tired, decreased energy _3 ?Change in appetite 2 0 1  ?Feeling bad or failure about yourself  0 0 1  ?Trouble concentrating 1 0 0  ?Moving slowly or fidgety/restless 0 0 0  ?Suicidal thoughts 0 0 0  ?PHQ-9 Score _4 ?Difficult doing work/chores Not difficult at all Not difficult at all Not difficult at all  ? ? ?  03/25/2022  ?  4:42 PM 08/15/2021  ?  4:22 PM 02/07/2021  ?   4:25 PM 10/11/2019  ?  3:49 PM  ?GAD 7 : Generalized Anxiety Score  ?Nervous, Anxious, on Edge 1 0 1 1  ?Control/stop worrying 1 0 1 1  ?Worry too much - different things 1 0 1 1  ?Trouble relaxing _5 ?  Restless 0 0 0 0  ?Easily annoyed or irritable 1 1 0 2  ?Afraid - awful might happen 0 0  0  ?Total GAD 7 Score _0 ?Anxiety Difficulty Not difficult at all Not difficult at all    ? ? ? ? ?Assessment/ Plan: ?71 y.o. male  ? ?Type 2 diabetes mellitus with other specified complication, without long-term current use of insulin (HCC) - Plan: Bayer DCA Hb A1c Waived ? ?Hypertension associated with diabetes ([place]) ? ?Hyperlipidemia associated with type 2 diabetes mellitus ([place]) ? ?Reactive depression - Plan: buPROPion (WELLBUTRIN SR) 150 MG 12 hr tablet ? ?Sugar is not at goal.  He is going to start back on the freestyle libre 2.  Orders were placed back in January for 1 year.  He should be able to access these at his convenience.  I think that the more regular monitoring of the blood sugars will make him more aware of what he is ingesting and subsequently reduce his blood sugar again.  I have made no medication changes today.  We will plan for diabetic foot exam at next visit as much of our exam today was based around stress response to current home situation ? ?Blood pressure is very borderline.  I would prefer his blood pressure to be closer to be <140/90.  We will be reconvening again in about 1 to 3 months for recheck of how the Wellbutrin is going. ? ?Continue statin medication.  Not yet due for fasting lipid ? ?Wellbutrin started at low-dose.  Hopefully this will help with both mood disturbance and appetite suppression. ? ?Orders Placed This Encounter  ?Procedures  ? Bayer DCA Hb A1c Waived  ? ?No orders of the defined types were placed in this encounter. ? ?Total time spent with patient 49 minutes.  Greater than 50% of encounter spent in coordination of care/counseling. ? ? ?Janora Norlander, DO ?[place] ?[phone] ? ? ?

## 2022-05-15 ENCOUNTER — Other Ambulatory Visit: Payer: Self-pay | Admitting: Family Medicine

## 2022-06-03 ENCOUNTER — Encounter: Payer: Self-pay | Admitting: Family Medicine

## 2022-06-03 ENCOUNTER — Telehealth: Payer: BC Managed Care – PPO | Admitting: Family Medicine

## 2022-06-06 ENCOUNTER — Encounter: Payer: Self-pay | Admitting: Family Medicine

## 2022-06-10 ENCOUNTER — Ambulatory Visit: Payer: BC Managed Care – PPO | Admitting: Family Medicine

## 2022-06-11 ENCOUNTER — Encounter: Payer: Self-pay | Admitting: Family Medicine

## 2022-06-15 ENCOUNTER — Other Ambulatory Visit: Payer: Self-pay | Admitting: Family Medicine

## 2022-06-15 DIAGNOSIS — E1169 Type 2 diabetes mellitus with other specified complication: Secondary | ICD-10-CM

## 2022-06-16 ENCOUNTER — Other Ambulatory Visit: Payer: Self-pay | Admitting: Family Medicine

## 2022-06-16 DIAGNOSIS — E1159 Type 2 diabetes mellitus with other circulatory complications: Secondary | ICD-10-CM

## 2022-06-22 ENCOUNTER — Other Ambulatory Visit: Payer: Self-pay | Admitting: Family Medicine

## 2022-06-22 DIAGNOSIS — F329 Major depressive disorder, single episode, unspecified: Secondary | ICD-10-CM

## 2022-07-09 ENCOUNTER — Ambulatory Visit: Payer: BC Managed Care – PPO | Admitting: Family Medicine

## 2022-07-12 ENCOUNTER — Telehealth: Payer: Self-pay

## 2022-07-12 NOTE — Telephone Encounter (Signed)
Your PA has been faxed to the plan as a paper copy. Please contact the plan directly if you haven't received a determination in a typical timeframe.  You will be notified of the determination electronically and via fax. How do I know if the plan approved the PA?  Add Reminder to your Dashboard Remind me in:  5 business days Contact plan to follow up on [facility]

## 2022-07-18 NOTE — Telephone Encounter (Signed)
Prior authorization not required for Rybelsus per insurance, pharmacy informed.

## 2022-07-22 ENCOUNTER — Ambulatory Visit: Payer: BC Managed Care – PPO | Admitting: Family Medicine

## 2022-07-24 ENCOUNTER — Ambulatory Visit (INDEPENDENT_AMBULATORY_CARE_PROVIDER_SITE_OTHER): Payer: BC Managed Care – PPO | Admitting: Family Medicine

## 2022-07-24 ENCOUNTER — Encounter: Payer: Self-pay | Admitting: Family Medicine

## 2022-07-24 VITALS — BP 120/73 | HR 82 | Temp 97.8°F | Ht 70.0 in | Wt 233.0 lb

## 2022-07-24 DIAGNOSIS — E1169 Type 2 diabetes mellitus with other specified complication: Secondary | ICD-10-CM | POA: Diagnosis not present

## 2022-07-24 DIAGNOSIS — F329 Major depressive disorder, single episode, unspecified: Secondary | ICD-10-CM | POA: Diagnosis not present

## 2022-07-24 DIAGNOSIS — I152 Hypertension secondary to endocrine disorders: Secondary | ICD-10-CM | POA: Diagnosis not present

## 2022-07-24 DIAGNOSIS — E1159 Type 2 diabetes mellitus with other circulatory complications: Secondary | ICD-10-CM | POA: Diagnosis not present

## 2022-07-24 DIAGNOSIS — E785 Hyperlipidemia, unspecified: Secondary | ICD-10-CM

## 2022-07-24 LAB — BAYER DCA HB A1C WAIVED: HB A1C (BAYER DCA - WAIVED): 7.7 % — ABNORMAL HIGH (ref 4.8–5.6)

## 2022-07-24 MED ORDER — BUPROPION HCL ER (XL) 150 MG PO TB24: 150.0000 mg | ORAL_TABLET | Freq: Every day | ORAL | 3 refills | Status: DC

## 2022-07-24 NOTE — Progress Notes (Signed)
Subjective: CC:DM PCP: Janora Norlander, DO WVP:[id number] Richard Nelson. is a 71 y.o. male presenting to clinic today for:  1. Type 2 Diabetes with hypertension, hyperlipidemia:  Last visit patient's sugar was noted to be elevated.  He elected to go back on his freestyle libre and was continued on all medications without any changes.  He committed to lifestyle modification efforts to reduce his blood sugar.  He admits he really has not made any changes.  He never did start back on the freestyle libre because it is costing over $80 a month even though he has 3 different insurances.  He has not tried asking them to switch off insurances to see if perhaps it might be cheaper.  He continues to have a lot of stress and mediating his dietary decisions and ate lots of carbs today including a tenderloin biscuit, peach cobbler etc.  Last eye exam: UTD Last foot exam: needs Last A1c:  Lab Results  Component Value Date   HGBA1C 7.7 (H) 03/25/2022   Nephropathy screen indicated?: UTD Last flu, zoster and/or pneumovax:  Immunization History  Administered Date(s) Administered   Influenza Inj Mdck Quad Pf 10/06/2017   Influenza, High Dose Seasonal PF 09/09/2018, 08/29/2019   Influenza,inj,Quad PF,6+ Mos 10/11/2019   Influenza-Unspecified 10/16/2016, 09/15/2020   Moderna Sars-Covid-2 Vaccination 01/12/2020, 02/09/2020, 11/06/2020   Pneumococcal Conjugate-13 10/08/2012, 10/11/2019   Pneumococcal Polysaccharide-23 11/08/2020   Tdap 06/15/2009, 11/12/2019   Zoster, Live 02/08/2013    ROS: No chest pain, shortness of breath or edema reported.  Sometimes he does have some burning in his feet however  2.  Reactive depression Patient had noted substantial stress surrounding the health of his wife.  He was started on low-dose Wellbutrin and is here for interval follow-up .  He has noticed somewhat of a difference but feels like perhaps he needs to take more as his symptoms are not totally controlled and  he is frequently emotionally eating as above.  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    Depressive disorder, not elsewhere classified    DM (diabetes mellitus) ([place])    Hemorrhage of rectum and anus    Hyperlipidemia    Hyperplasia of prostate    Hypertension    Polyp of colon     Current Outpatient Medications:    aspirin 81 MG EC tablet, Take 81 mg by mouth daily., Disp: , Rfl:    atorvastatin (LIPITOR) 20 MG tablet, TAKE 1 TABLET BY MOUTH EVERY DAY, Disp: 90 tablet, Rfl: 0   Blood Glucose Monitoring Suppl (ONETOUCH VERIO) w/Device KIT, 1 kit by Does not apply route daily., Disp: 1 kit, Rfl: 0   buPROPion (WELLBUTRIN SR) 150 MG 12 hr tablet, TAKE 1 TABLET BY MOUTH EVERY DAY, Disp: 90 tablet, Rfl: 0   clomiPHENE (CLOMID) 50 MG tablet, Take 25 mg by mouth daily. (Patient not taking: Reported on 03/25/2022), Disp: , Rfl:    Continuous Blood Gluc Sensor (FREESTYLE LIBRE 2 SENSOR) MISC, Use to check blood sugar continuously, Disp: 2 each, Rfl: 10   Continuous Blood Gluc Sensor (FREESTYLE LIBRE 3 SENSOR) MISC, 1 Device by Does not apply route every 14 (fourteen) days. to check glucose continuously. E11.9, Disp:  6 each, Rfl: 3   Dapagliflozin-metFORMIN HCl ER (XIGDUO XR) 04-999 MG TB24, Take 1 tablet by mouth in the morning and at bedtime., Disp: 180 tablet, Rfl: 3   enalapril (VASOTEC) 5 MG tablet, TAKE 1 TABLET (5 MG TOTAL) BY MOUTH DAILY., Disp: 90 tablet, Rfl: 0   fluticasone (FLONASE) 50 MCG/ACT nasal spray, SPRAY 2 SPRAYS INTO EACH NOSTRIL EVERY DAY, Disp: 48 mL, Rfl: 0   glucose blood (ONETOUCH VERIO) test strip, TEST BLOOD SUGAR TWICE A DAY Dx E11.9, Disp: 200 strip, Rfl: 3   glyBURIDE (DIABETA) 5 MG tablet, Take 1 tablet (5 mg total) by mouth  daily with breakfast., Disp: 90 tablet, Rfl: 3   OneTouch Delica Lancets 56L MISC, TEST BLOOD SUGAR TWICE A DAY Dx E11.9, Disp: 200 each, Rfl: 3   Semaglutide (RYBELSUS) 14 MG TABS, Take 1 tablet by mouth daily before breakfast., Disp: 90 tablet, Rfl: 3   sildenafil (REVATIO) 20 MG tablet, TAKE 2-5 TABLETS AS NEEDED PRIOR TO SEXUAL ACTIVITY, Disp: 50 tablet, Rfl: 0 Social History   Socioeconomic History   Marital status: Married    Spouse name: Not on file   Number of children: Not on file   Years of education: Not on file   Highest education level: Not on file  Occupational History   Not on file  Tobacco Use   Smoking status: Never   Smokeless tobacco: Never  Vaping Use   Vaping Use: Never used  Substance and Sexual Activity   Alcohol use: No   Drug use: No   Sexual activity: Not on file  Other Topics Concern   Not on file  Social History Narrative   Not on file   Social Determinants of Health   Financial Resource Strain: Not on file  Food Insecurity: Not on file  Transportation Needs: Not on file  Physical Activity: Not on file  Stress: Not on file  Social Connections: Not on file  Intimate Partner Violence: Not on file   Family History  Problem Relation Age of Onset   Heart disease Father 53       CABG   Diabetes Brother    Cancer Brother 25       brain tumor   Colon cancer Paternal Uncle    Stroke Maternal Grandmother    Cancer Maternal Aunt        brain cancer   Diabetes Brother    Esophageal cancer Neg Hx    Rectal cancer Neg Hx    Stomach cancer Neg Hx     Objective: Office vital signs reviewed. BP 120/73   Pulse 82   Temp 97.8 Richard (36.6 C)   Ht '5\' 10"'  (1.778 m)   Wt 233 lb (105.7 kg)   SpO2 94%   BMI 33.43 kg/m   Physical Examination:  General: Awake, alert, obese, nontoxic male, No acute distress HEENT: Sclera white.  Moist mucous membranes Cardio: regular rate and rhythm, S1S2 heard, no murmurs appreciated Pulm: clear to auscultation  bilaterally, no wheezes, rhonchi or rales; normal work of breathing on room air Neuro: see DM foot  Diabetic Foot Exam - Simple   Simple Foot Form Diabetic Foot exam was performed with the following findings: Yes 07/24/2022  4:56 PM  Visual Inspection No deformities, no ulcerations, no other skin breakdown bilaterally: Yes Sensation Testing Intact to touch and monofilament testing bilaterally: Yes Pulse Check Posterior Tibialis and Dorsalis pulse intact bilaterally: Yes Comments Callus formation noted along the medial plantar aspects of  bilateral great toes.      Assessment/ Plan: 71 y.o. male   Type 2 diabetes mellitus with other specified complication, without long-term current use of insulin ([place]) - Plan: Bayer DCA Hb A1c Waived  Hypertension associated with diabetes ([place])  Hyperlipidemia associated with type 2 diabetes mellitus ([place])  Reactive depression - Plan: buPROPion (WELLBUTRIN XL) 150 MG 24 hr tablet  Sugars not controlled A1c unchanged from previous at 7.7.  I will reach out to Almyra Free to see if there are any options for assistance with this Elenor Legato since he does technically have Medicare even though he is still privately insured through [employer] and [employer] because he is still working.  Blood pressure controlled.  No changes  Continue statin  Advance Wellbutrin to extended release 150 mg tablets    No orders of the defined types were placed in this encounter.  No orders of the defined types were placed in this encounter.    Janora Norlander, DO [place] [phone]

## 2022-07-26 ENCOUNTER — Encounter: Payer: Self-pay | Admitting: Family Medicine

## 2022-08-18 ENCOUNTER — Other Ambulatory Visit: Payer: Self-pay | Admitting: Family Medicine

## 2022-08-18 DIAGNOSIS — E1165 Type 2 diabetes mellitus with hyperglycemia: Secondary | ICD-10-CM

## 2022-08-22 ENCOUNTER — Other Ambulatory Visit: Payer: Self-pay | Admitting: Family Medicine

## 2022-09-04 ENCOUNTER — Other Ambulatory Visit: Payer: Self-pay | Admitting: Family Medicine

## 2022-09-04 ENCOUNTER — Telehealth: Payer: Self-pay | Admitting: *Deleted

## 2022-09-04 DIAGNOSIS — E1165 Type 2 diabetes mellitus with hyperglycemia: Secondary | ICD-10-CM

## 2022-09-04 DIAGNOSIS — I152 Hypertension secondary to endocrine disorders: Secondary | ICD-10-CM

## 2022-09-04 NOTE — Telephone Encounter (Signed)
Rybelsus PA-In Process    Key: [id number]  PA ID-  [id number]

## 2022-09-05 NOTE — Telephone Encounter (Signed)
PA Case: [id number], Status: Approved, Coverage Starts on: 09/04/2022 12:00:00 AM, Coverage Ends on: 09/04/2023 12:00:00 AM.

## 2022-09-14 ENCOUNTER — Other Ambulatory Visit: Payer: Self-pay | Admitting: Family Medicine

## 2022-09-14 DIAGNOSIS — E1169 Type 2 diabetes mellitus with other specified complication: Secondary | ICD-10-CM

## 2022-10-21 ENCOUNTER — Encounter: Payer: Self-pay | Admitting: Family Medicine

## 2022-10-21 ENCOUNTER — Ambulatory Visit (INDEPENDENT_AMBULATORY_CARE_PROVIDER_SITE_OTHER): Payer: BC Managed Care – PPO | Admitting: Family Medicine

## 2022-10-21 VITALS — BP 142/75 | HR 94 | Temp 98.1°F | Ht 70.0 in | Wt 235.0 lb

## 2022-10-21 DIAGNOSIS — F411 Generalized anxiety disorder: Secondary | ICD-10-CM

## 2022-10-21 DIAGNOSIS — E785 Hyperlipidemia, unspecified: Secondary | ICD-10-CM

## 2022-10-21 DIAGNOSIS — I152 Hypertension secondary to endocrine disorders: Secondary | ICD-10-CM

## 2022-10-21 DIAGNOSIS — E1169 Type 2 diabetes mellitus with other specified complication: Secondary | ICD-10-CM

## 2022-10-21 DIAGNOSIS — E1159 Type 2 diabetes mellitus with other circulatory complications: Secondary | ICD-10-CM | POA: Diagnosis not present

## 2022-10-21 DIAGNOSIS — Z6379 Other stressful life events affecting family and household: Secondary | ICD-10-CM | POA: Diagnosis not present

## 2022-10-21 DIAGNOSIS — M25569 Pain in unspecified knee: Secondary | ICD-10-CM

## 2022-10-21 LAB — BAYER DCA HB A1C WAIVED: HB A1C (BAYER DCA - WAIVED): 8.2 % — ABNORMAL HIGH (ref 4.8–5.6)

## 2022-10-21 MED ORDER — FREESTYLE LIBRE 2 SENSOR MISC: 4 refills | Status: AC

## 2022-10-21 MED ORDER — DICLOFENAC SODIUM 1 % EX GEL: 2.0000 g | Freq: Four times a day (QID) | CUTANEOUS | 1 refills | Status: DC

## 2022-10-21 NOTE — Progress Notes (Signed)
Subjective: CC:Follow-up  PCP: Janora Norlander, DO WGN:FAOZHYQ F Mehdi Gironda. is a 71 y.o. male presenting to clinic today for hypertension follow-up  Hypertension  Hyperlipidemia  Patient continues to be compliant with his medications, including aspirin, atorvastatin, enalapril. Does not report chest pain or shortness of breath. Previously noted lower extremity edema is improved.  Has not been actively working on lifestyle modifications due to ongoing stress in his lifeHas not been checking blood pressure medications at home. Does not report easy bruising.   Type 2 Diabetes Mellitus   Patient does not check blood glucose at home. Reports that the last time he was compliant with blood sugar checks was when he had the [facility]. Continues to be compliant with medications, including glyburide and Rybelsus. Does not have trouble accessing these medications. Endorses some changes to his vision, numbness and tingling in the lower extremities occasionally. Denies headaches. No hypoglycemic episodes, abdominal pain, constipation, or food regurgitation. Patient reports that he has continued to consume a high carbohydrate diet.   Stress Patient reports significant ongoing stressors at home, including his wife's declining health, care for his parents, and a brother who lives oversees. He reports that it is increasingly difficult to find time to himself, and feels overwhelmed by feelings of aggregation and disdain for his partner. He says that he would be willing to speak with a psychotherapist and previously had some success with this. Reports that he previously had an outlet through his work friend and does not have this outlet anymore. Patient is compliant with his bupropion.   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    Depressive disorder, not elsewhere classified    DM (diabetes mellitus) ([place])    Hemorrhage of rectum and anus    Hyperlipidemia    Hyperplasia of prostate    Hypertension    Polyp of colon     Current Outpatient Medications:    aspirin 81 MG EC tablet, Take 81 mg by mouth daily., Disp: , Rfl:    atorvastatin (LIPITOR) 20 MG tablet, TAKE 1 TABLET BY MOUTH EVERY DAY, Disp: 90 tablet, Rfl: 0   Blood Glucose Monitoring Suppl (ONETOUCH VERIO) w/Device KIT, 1 kit by Does not apply route daily., Disp: 1 kit, Rfl: 0   buPROPion (WELLBUTRIN XL) 150 MG 24 hr tablet, Take 1 tablet (150 mg total) by mouth daily. To replace SR, Disp: 90 tablet, Rfl: 3   diclofenac Sodium (VOLTAREN) 1 % GEL, Apply 2 g topically 4 (four) times daily., Disp: 400 g, Rfl: 1   enalapril (VASOTEC) 5 MG tablet, TAKE 1 TABLET (5 MG TOTAL) BY MOUTH DAILY., Disp: 90 tablet, Rfl: 0   fluticasone (FLONASE) 50 MCG/ACT nasal spray, SPRAY 2 SPRAYS INTO EACH NOSTRIL EVERY DAY, Disp: 48 mL, Rfl: 0   glucose blood (ONETOUCH VERIO) test strip, TEST BLOOD SUGAR TWICE A DAY Dx E11.9, Disp: 200 strip, Rfl: 3   glyBURIDE (DIABETA) 5 MG tablet, Take 1 tablet (5 mg total) by mouth daily with breakfast., Disp: 90 tablet, Rfl: 3   OneTouch Delica Lancets 65H MISC, TEST BLOOD SUGAR TWICE A DAY Dx E11.9, Disp: 200 each, Rfl: 3   RYBELSUS 14 MG TABS, TAKE 1 TABLET  BY MOUTH EVERY DAY BEFORE BREAKFAST, Disp: 90 tablet, Rfl: 0   XIGDUO XR 04-999 MG TB24, TAKE 1 TABLET BY MOUTH IN THE MORNING AND IN THE EVENING, Disp: 180 tablet, Rfl: 0   clomiPHENE (CLOMID) 50 MG tablet, Take 25 mg by mouth daily. (Patient not taking: Reported on 03/25/2022), Disp: , Rfl:    Continuous Blood Gluc Sensor (FREESTYLE LIBRE 2 SENSOR) MISC, Use to check blood sugar continuously e11.69, Disp: 6 each, Rfl: 4   sildenafil (REVATIO) 20 MG tablet, TAKE 2-5 TABLETS AS NEEDED PRIOR TO SEXUAL ACTIVITY  (Patient not taking: Reported on 10/21/2022), Disp: 50 tablet, Rfl: 0 Social History   Socioeconomic History   Marital status: Married    Spouse name: Not on file   Number of children: Not on file   Years of education: Not on file   Highest education level: Not on file  Occupational History   Not on file  Tobacco Use   Smoking status: Never   Smokeless tobacco: Never  Vaping Use   Vaping Use: Never used  Substance and Sexual Activity   Alcohol use: No   Drug use: No   Sexual activity: Not on file  Other Topics Concern   Not on file  Social History Narrative   Not on file   Social Determinants of Health   Financial Resource Strain: Not on file  Food Insecurity: Not on file  Transportation Needs: Not on file  Physical Activity: Not on file  Stress: Not on file  Social Connections: Not on file  Intimate Partner Violence: Not on file   Family History  Problem Relation Age of Onset   Heart disease Father 53       CABG   Diabetes Brother    Cancer Brother 17       brain tumor   Colon cancer Paternal Uncle    Stroke Maternal Grandmother    Cancer Maternal Aunt        brain cancer   Diabetes Brother    Esophageal cancer Neg Hx    Rectal cancer Neg Hx    Stomach cancer Neg Hx     Objective: Office vital signs reviewed. BP (!) 142/75   Pulse 94   Temp 98.1 F (36.7 C)   Ht _0  (1.778 m)   Wt 235 lb (106.6 kg)   SpO2 95%   BMI 33.72 kg/m   Physical Examination:  General: Awake, alert, well nourished, No acute distress HEENT: Normal    Neck: No masses palpated. No lymphadenopathy    Ears: Tympanic membranes intact, normal light reflex, no erythema, no bulging    Eyes: PERRLA, extraocular membranes intact, sclera white    Nose: nasal turbinates moist, no nasal discharge    Throat: moist mucus membranes, no erythema, no tonsillar exudate.  Airway is patent Cardio: regular rate and rhythm, S1S2 heard, no murmurs appreciated Pulm: clear to auscultation  bilaterally, no wheezes, rhonchi or rales; normal work of breathing on room air GI: soft, non-tender, non-distended, bowel sounds present x4, no hepatomegaly, no splenomegaly, no masses Extremities: warm, well perfused, No edema, cyanosis or clubbing; +1 pulses bilaterally Skin: dry; intact; no rashes. 4 cm linear abrasions are present on the anterior aspect of both forearms (dog scratches)  Assessment/ Plan: 71 y.o. male   HTN/HLD/DM ED  The patient's A1C measured 8.2 in the clinic today, increased from 7.7 at the last visit. He has experienced elevated blood sugar levels for the past year and has not  adhered to recommended dietary changes. I also suspect that his persistent stress at home is contributing to his elevated blood sugar levels. The patient has expressed a desire to resume using the [facility]. We discussed that if his blood sugar levels continue to remain high, we will need to consider long-acting injectable medications to improve blood sugar control. The patient had previously achieved success in monitoring his blood sugar levels with this device. I have issued a prescription for this device and provided the patient with samples.  Reduced social support seems to be intensifying his stress levels. Since the patient is open to speaking with a psychotherapist, I will refer him to a psychologist. I believe he would greatly benefit from regular and consistent talk therapy.    Orders Placed This Encounter  Procedures   CMP14+EGFR   Bayer DCA Hb A1c Waived   Microalbumin / creatinine urine ratio   Ambulatory referral to Psychology    Referral Priority:   Routine    Referral Type:   Psychiatric    Referral Reason:   Specialty Services Required    Requested Specialty:   Psychology    Number of Visits Requested:   1   Meds ordered this encounter  Medications   Continuous Blood Gluc Sensor (FREESTYLE LIBRE 2 SENSOR) MISC    Sig: Use to check blood sugar continuously e11.69     Dispense:  6 each    Refill:  4   diclofenac Sodium (VOLTAREN) 1 % GEL    Sig: Apply 2 g topically 4 (four) times daily.    Dispense:  400 g    Refill:  [place], MS3

## 2022-10-22 ENCOUNTER — Ambulatory Visit: Payer: BC Managed Care – PPO | Admitting: Family Medicine

## 2022-10-22 LAB — CMP14+EGFR
ALT: 17 IU/L (ref 0–44)
AST: 14 IU/L (ref 0–40)
Albumin/Globulin Ratio: 1.8 (ref 1.2–2.2)
Albumin: 4.4 g/dL (ref 3.9–4.9)
Alkaline Phosphatase: 108 IU/L (ref 44–121)
BUN/Creatinine Ratio: 14 (ref 10–24)
BUN: 11 mg/dL (ref 8–27)
Bilirubin Total: 0.3 mg/dL (ref 0.0–1.2)
CO2: 22 mmol/L (ref 20–29)
Calcium: 9.3 mg/dL (ref 8.6–10.2)
Chloride: 104 mmol/L (ref 96–106)
Creatinine, Ser: 0.81 mg/dL (ref 0.76–1.27)
Globulin, Total: 2.4 g/dL (ref 1.5–4.5)
Glucose: 210 mg/dL — ABNORMAL HIGH (ref 70–99)
Potassium: 4.1 mmol/L (ref 3.5–5.2)
Sodium: 143 mmol/L (ref 134–144)
Total Protein: 6.8 g/dL (ref 6.0–8.5)
eGFR: 95 mL/min/{1.73_m2} (ref >59)

## 2022-10-22 LAB — MICROALBUMIN / CREATININE URINE RATIO
Creatinine, Urine: 53.4 mg/dL
Microalb/Creat Ratio: 8 mg/g creat (ref 0–29)
Microalbumin, Urine: 4.3 ug/mL

## 2022-10-29 ENCOUNTER — Other Ambulatory Visit: Payer: Self-pay | Admitting: Family Medicine

## 2022-10-29 DIAGNOSIS — E1165 Type 2 diabetes mellitus with hyperglycemia: Secondary | ICD-10-CM

## 2022-11-17 ENCOUNTER — Other Ambulatory Visit: Payer: Self-pay | Admitting: Family Medicine

## 2022-11-21 ENCOUNTER — Other Ambulatory Visit: Payer: Self-pay | Admitting: Family Medicine

## 2022-11-21 DIAGNOSIS — E1165 Type 2 diabetes mellitus with hyperglycemia: Secondary | ICD-10-CM

## 2022-11-21 DIAGNOSIS — I152 Hypertension secondary to endocrine disorders: Secondary | ICD-10-CM

## 2022-12-13 ENCOUNTER — Other Ambulatory Visit: Payer: Self-pay | Admitting: Family Medicine

## 2022-12-13 DIAGNOSIS — E1169 Type 2 diabetes mellitus with other specified complication: Secondary | ICD-10-CM

## 2023-01-05 ENCOUNTER — Other Ambulatory Visit: Payer: Self-pay | Admitting: Family Medicine

## 2023-01-05 DIAGNOSIS — I152 Hypertension secondary to endocrine disorders: Secondary | ICD-10-CM

## 2023-01-06 ENCOUNTER — Encounter: Payer: Self-pay | Admitting: Family Medicine

## 2023-01-07 ENCOUNTER — Telehealth: Payer: Self-pay | Admitting: Family Medicine

## 2023-01-07 DIAGNOSIS — E1169 Type 2 diabetes mellitus with other specified complication: Secondary | ICD-10-CM

## 2023-01-08 ENCOUNTER — Other Ambulatory Visit: Payer: Self-pay | Admitting: Family Medicine

## 2023-01-08 DIAGNOSIS — E1169 Type 2 diabetes mellitus with other specified complication: Secondary | ICD-10-CM

## 2023-01-08 NOTE — Telephone Encounter (Signed)
Name from pharmacy: Ethan comment: Alternative Requested:PRIOR AUTHORIZATION NEEDED.

## 2023-01-16 ENCOUNTER — Other Ambulatory Visit (HOSPITAL_COMMUNITY): Payer: Self-pay

## 2023-01-16 NOTE — Telephone Encounter (Signed)
Pharmacy Patient Advocate Encounter   Received notification that prior authorization for FreeStyle Libre 2 Sensor is required/requested.  Per Test Claim: PA required   PA submitted on 01/16/23 to (ins) [employer] via [employer] Status is pending

## 2023-01-17 NOTE — Telephone Encounter (Addendum)
Received a fax regarding Prior Authorization from [employer] for Richard Nelson 2 Sensor.  Authorization has been DENIED because did not see what we needed to approve the device. May be able to approve in certain situations (when requires insulin injections multiple times daily for maintenance of blood sugar control or when an insulin pump is medically necessary to maintain blood sugar control).  Phone# [phone]  Denial letter attached to chart

## 2023-01-28 ENCOUNTER — Other Ambulatory Visit: Payer: Self-pay | Admitting: Family Medicine

## 2023-01-28 DIAGNOSIS — M25569 Pain in unspecified knee: Secondary | ICD-10-CM

## 2023-02-03 ENCOUNTER — Encounter: Payer: Self-pay | Admitting: Family Medicine

## 2023-02-03 ENCOUNTER — Ambulatory Visit: Payer: BC Managed Care – PPO | Admitting: Family Medicine

## 2023-02-20 ENCOUNTER — Other Ambulatory Visit: Payer: Self-pay | Admitting: Family Medicine

## 2023-02-20 DIAGNOSIS — E1165 Type 2 diabetes mellitus with hyperglycemia: Secondary | ICD-10-CM

## 2023-02-28 ENCOUNTER — Telehealth: Payer: Self-pay

## 2023-02-28 NOTE — Telephone Encounter (Signed)
Richard Nelson (Key: [place]) PA Case ID #: [id number] Rx #: [id number] Need Help? Call us at [phone] Status sent iconSent to Plan today Drug Diclofenac Sodium 1% gel ePA cloud Social research officer, government PA Form [phone] NCPDP) Original Claim Info [place] REQ-MD CALL [phone].DRUG REQUIRES PRIOR AUTHORIZATION

## 2023-03-01 ENCOUNTER — Other Ambulatory Visit: Payer: Self-pay | Admitting: Family Medicine

## 2023-03-01 DIAGNOSIS — E1165 Type 2 diabetes mellitus with hyperglycemia: Secondary | ICD-10-CM

## 2023-03-04 ENCOUNTER — Ambulatory Visit: Payer: BC Managed Care – PPO | Admitting: Family Medicine

## 2023-03-04 NOTE — Telephone Encounter (Signed)
Outcome Approved on March 15 Your PA request has been approved. Additional information will be provided in the approval communication. (Message 1145) Authorization Expiration Date: 02/26/2026

## 2023-03-09 ENCOUNTER — Other Ambulatory Visit: Payer: Self-pay | Admitting: Family Medicine

## 2023-03-09 DIAGNOSIS — E1169 Type 2 diabetes mellitus with other specified complication: Secondary | ICD-10-CM

## 2023-03-19 ENCOUNTER — Ambulatory Visit (INDEPENDENT_AMBULATORY_CARE_PROVIDER_SITE_OTHER): Payer: BC Managed Care – PPO | Admitting: Family Medicine

## 2023-03-19 ENCOUNTER — Encounter: Payer: Self-pay | Admitting: Family Medicine

## 2023-03-19 VITALS — BP 111/71 | HR 77 | Temp 98.6°F | Ht 70.0 in | Wt 228.0 lb

## 2023-03-19 DIAGNOSIS — E1159 Type 2 diabetes mellitus with other circulatory complications: Secondary | ICD-10-CM | POA: Diagnosis not present

## 2023-03-19 DIAGNOSIS — E1169 Type 2 diabetes mellitus with other specified complication: Secondary | ICD-10-CM

## 2023-03-19 DIAGNOSIS — I152 Hypertension secondary to endocrine disorders: Secondary | ICD-10-CM

## 2023-03-19 DIAGNOSIS — Z6379 Other stressful life events affecting family and household: Secondary | ICD-10-CM | POA: Diagnosis not present

## 2023-03-19 DIAGNOSIS — E785 Hyperlipidemia, unspecified: Secondary | ICD-10-CM

## 2023-03-19 MED ORDER — BUPROPION HCL ER (XL) 300 MG PO TB24: 300.0000 mg | ORAL_TABLET | Freq: Every day | ORAL | 3 refills | Status: DC

## 2023-03-19 NOTE — Progress Notes (Signed)
Subjective: CC:DM PCP: Richard Norlander, DO [id number] [name]. is a 72 y.o. male presenting to clinic today for:  1. Type 2 Diabetes with hypertension, hyperlipidemia:  Has FreeStyle Libre 2 at home but notes that he had a couple of monitors that simply just did not work.  They malfunctioned after just a couple of days or did not hear appropriately.  He subsequently gave up on using them for a while.  Has not been doing point-of-care testing on the finger either.  He is compliant with all medications.  He continues to have stress eating and often eats high carb meals.  He is apprised that he has had some weight loss.  Last eye exam: UTD Last foot exam: UTD Last A1c:  Lab Results  Component Value Date   HGBA1C 8.2 (H) 10/21/2022   Nephropathy screen indicated?: UTD Last flu, zoster and/or pneumovax:  Immunization History  Administered Date(s) Administered   Influenza Inj Mdck Quad Pf 10/06/2017   Influenza, High Dose Seasonal PF 09/09/2018, 08/29/2019   Influenza,inj,Quad PF,6+ Mos 10/11/2019   Influenza-Unspecified 10/16/2016, 09/15/2020   Moderna Sars-Covid-2 Vaccination 01/12/2020, 02/09/2020, 11/06/2020   Pneumococcal Conjugate-13 10/08/2012, 10/11/2019   Pneumococcal Polysaccharide-23 11/08/2020   Tdap 06/15/2009, 11/12/2019   Zoster, Live 02/08/2013   2. Stress reaction Continues to have quite a bit of stress reaction related to the declining memory and health of his parents and his wife.  He reports feeling overwhelmed, easily irritable.  He resumed use of Wellbutrin XL 150 mg but really has not noticed a huge difference with that.  He considered increasing the dose to 300 mg but did not want to do this without talking to me first.  He reports easy ability to sleep but again mood has been not the best.  He often worries about the future and what he will do with his time if he retires.  He does not feel like he has a good friend support group.   ROS: Per  HPI  Allergies  Allergen Reactions   Sulfonamide Derivatives Swelling    SWELLING OF TONGUE   Erythromycin Other (See Comments)    Patient unsure reaction   Past Medical History:  Diagnosis Date   Allergy    Anxiety states    Decreased free testosterone level in male    Depressive disorder, not elsewhere classified    DM (diabetes mellitus) ([place])    Hemorrhage of rectum and anus    Hyperlipidemia    Hyperplasia of prostate    Hypertension    Polyp of colon     Current Outpatient Medications:    aspirin 81 MG EC tablet, Take 81 mg by mouth daily., Disp: , Rfl:    atorvastatin (LIPITOR) 20 MG tablet, TAKE 1 TABLET BY MOUTH EVERY DAY, Disp: 90 tablet, Rfl: 0   Blood Glucose Monitoring Suppl (ONETOUCH VERIO) w/Device KIT, 1 kit by Does not apply route daily., Disp: 1 kit, Rfl: 0   buPROPion (WELLBUTRIN XL) 150 MG 24 hr tablet, Take 1 tablet (150 mg total) by mouth daily. To replace SR, Disp: 90 tablet, Rfl: 3   clomiPHENE (CLOMID) 50 MG tablet, Take 25 mg by mouth daily. (Patient not taking: Reported on 03/25/2022), Disp: , Rfl:    Continuous Blood Gluc Sensor (FREESTYLE LIBRE 2 SENSOR) MISC, Use to check blood sugar continuously e11.69, Disp: 6 each, Rfl: 4   Dapagliflozin Pro-metFORMIN ER (XIGDUO XR) 04-999 MG TB24, TAKE 1 TABLET BY MOUTH TWICE A DAY IN THE  MORNING AND IN THE EVENING, Disp: 60 tablet, Rfl: 0   diclofenac Sodium (VOLTAREN) 1 % GEL, APPLY 2 GRAMS TO AFFECTED AREA 4 TIMES A DAY, Disp: 400 g, Rfl: PRN   enalapril (VASOTEC) 5 MG tablet, TAKE 1 TABLET (5 MG TOTAL) BY MOUTH DAILY., Disp: 90 tablet, Rfl: 0   fluticasone (FLONASE) 50 MCG/ACT nasal spray, SPRAY 2 SPRAYS INTO EACH NOSTRIL EVERY DAY, Disp: 48 mL, Rfl: 1   glucose blood (ONETOUCH VERIO) test strip, TEST BLOOD SUGAR TWICE A DAY Dx E11.9, Disp: 200 strip, Rfl: 3   glyBURIDE (DIABETA) 5 MG tablet, TAKE 1 TABLET BY MOUTH EVERY DAY WITH BREAKFAST, Disp: 90 tablet, Rfl: 0   OneTouch Delica Lancets 99991111 MISC, TEST BLOOD  SUGAR TWICE A DAY Dx E11.9, Disp: 200 each, Rfl: 3   Semaglutide (RYBELSUS) 14 MG TABS, TAKE 1 TABLET BY MOUTH EVERY DAY BEFORE BREAKFAST, Disp: 30 tablet, Rfl: 0   sildenafil (REVATIO) 20 MG tablet, TAKE 2-5 TABLETS AS NEEDED PRIOR TO SEXUAL ACTIVITY (Patient not taking: Reported on 10/21/2022), Disp: 50 tablet, Rfl: 0 Social History   Socioeconomic History   Marital status: Married    Spouse name: Not on file   Number of children: Not on file   Years of education: Not on file   Highest education level: Not on file  Occupational History   Not on file  Tobacco Use   Smoking status: Never   Smokeless tobacco: Never  Vaping Use   Vaping Use: Never used  Substance and Sexual Activity   Alcohol use: No   Drug use: No   Sexual activity: Not on file  Other Topics Concern   Not on file  Social History Narrative   Not on file   Social Determinants of Health   Financial Resource Strain: Not on file  Food Insecurity: Not on file  Transportation Needs: Not on file  Physical Activity: Not on file  Stress: Not on file  Social Connections: Not on file  Intimate Partner Violence: Not on file   Family History  Problem Relation Age of Onset   Heart disease Father 52       CABG   Diabetes Brother    Cancer Brother 65       brain tumor   Colon cancer Paternal Uncle    Stroke Maternal Grandmother    Cancer Maternal Aunt        brain cancer   Diabetes Brother    Esophageal cancer Neg Hx    Rectal cancer Neg Hx    Stomach cancer Neg Hx     Objective: Office vital signs reviewed. BP 111/71   Pulse 77   Temp 98.6 F (37 C)   Ht 5\' 10"  (1.778 m)   Wt 228 lb (103.4 kg)   SpO2 95%   BMI 32.71 kg/m   Physical Examination:  General: Awake, alert, well nourished, No acute distress HEENT: sclera white, MMM Pulm: normal work of breathing on room air Psych: anxious, thought tangential     03/19/2023    3:54 PM 10/21/2022    4:24 PM 07/24/2022    4:45 PM  Depression screen PHQ  2/9  Decreased Interest 1 1 1   Down, Depressed, Hopeless 1 1 1   PHQ - 2 Score 2 2 2   Altered sleeping 0 0 0  Tired, decreased energy 1 0 1  Change in appetite 2 0 0  Feeling bad or failure about yourself  1 0 0  Trouble concentrating 1  0 1  Moving slowly or fidgety/restless 0 0 0  Suicidal thoughts 1 0 0  PHQ-9 Score 8 2 4   Difficult doing work/chores Not difficult at all Not difficult at all Not difficult at all      03/19/2023    3:49 PM 10/21/2022    4:24 PM 07/24/2022    4:45 PM 03/25/2022    4:42 PM  GAD 7 : Generalized Anxiety Score  Nervous, Anxious, on Edge 1 1 1 1   Control/stop worrying 1 1 1 1   Worry too much - different things 1 0 1 1  Trouble relaxing 1 0 1 1  Restless 0 0 0 0  Easily annoyed or irritable 2 1 1 1   Afraid - awful might happen 1 1 1  0  Total GAD 7 Score 7 4 6 5   Anxiety Difficulty Somewhat difficult Somewhat difficult Not difficult at all Not difficult at all   Assessment/ Plan: 72 y.o. male   Type 2 diabetes mellitus with other specified complication, without long-term current use of insulin - Plan: Bayer DCA Hb A1c Waived  Hyperlipidemia associated with type 2 diabetes mellitus  Hypertension associated with diabetes  Stress due to illness of family member - Plan: buPROPion (WELLBUTRIN XL) 300 MG 24 hr tablet  Sugar remains uncontrolled with A1c of 8.0.  We identified a lack of blood sugar monitoring, and appropriate dietary choices etc.  We talked about alternatives to some of the snacks that he has been selecting when he is stressed eating.  Recommended low-carb, high-fiber foods.  Continue current cholesterol regimen.  Blood pressure is controlled.  No changes  Advance Wellbutrin to 300 mg daily.  I am going to see if I can get him plugged into our CCM therapist.  I think that he would benefit from having someone to talk to  Orders Placed This Encounter  Procedures   Bayer [place] Hb A1c Waived   No orders of the defined types were placed in  this encounter.    Richard Norlander, DO [place] [phone]

## 2023-03-20 LAB — BAYER DCA HB A1C WAIVED: HB A1C (BAYER DCA - WAIVED): 8 % — ABNORMAL HIGH (ref 4.8–5.6)

## 2023-03-24 NOTE — Addendum Note (Signed)
Addended by: Raliegh Ip on: 03/24/2023 03:28 PM   Modules accepted: Orders

## 2023-03-25 ENCOUNTER — Other Ambulatory Visit: Payer: Self-pay | Admitting: Family Medicine

## 2023-03-25 DIAGNOSIS — E1165 Type 2 diabetes mellitus with hyperglycemia: Secondary | ICD-10-CM

## 2023-03-31 ENCOUNTER — Other Ambulatory Visit: Payer: Self-pay | Admitting: Family Medicine

## 2023-03-31 DIAGNOSIS — E1165 Type 2 diabetes mellitus with hyperglycemia: Secondary | ICD-10-CM

## 2023-05-06 ENCOUNTER — Institutional Professional Consult (permissible substitution): Payer: Medicare Other | Admitting: Professional Counselor

## 2023-05-09 ENCOUNTER — Other Ambulatory Visit: Payer: Self-pay | Admitting: Family Medicine

## 2023-05-09 DIAGNOSIS — E1165 Type 2 diabetes mellitus with hyperglycemia: Secondary | ICD-10-CM

## 2023-06-07 ENCOUNTER — Other Ambulatory Visit: Payer: Self-pay | Admitting: Family Medicine

## 2023-06-07 DIAGNOSIS — E1169 Type 2 diabetes mellitus with other specified complication: Secondary | ICD-10-CM

## 2023-06-25 ENCOUNTER — Other Ambulatory Visit: Payer: Self-pay | Admitting: Family Medicine

## 2023-06-25 DIAGNOSIS — E1165 Type 2 diabetes mellitus with hyperglycemia: Secondary | ICD-10-CM

## 2023-06-27 ENCOUNTER — Ambulatory Visit (INDEPENDENT_AMBULATORY_CARE_PROVIDER_SITE_OTHER): Payer: BC Managed Care – PPO | Admitting: Family Medicine

## 2023-06-27 ENCOUNTER — Encounter: Payer: Self-pay | Admitting: Family Medicine

## 2023-06-27 VITALS — BP 117/71 | HR 87 | Temp 97.4°F | Ht 70.0 in | Wt 224.4 lb

## 2023-06-27 DIAGNOSIS — E785 Hyperlipidemia, unspecified: Secondary | ICD-10-CM

## 2023-06-27 DIAGNOSIS — M25571 Pain in right ankle and joints of right foot: Secondary | ICD-10-CM

## 2023-06-27 DIAGNOSIS — E1169 Type 2 diabetes mellitus with other specified complication: Secondary | ICD-10-CM | POA: Diagnosis not present

## 2023-06-27 DIAGNOSIS — E1159 Type 2 diabetes mellitus with other circulatory complications: Secondary | ICD-10-CM

## 2023-06-27 DIAGNOSIS — Z6379 Other stressful life events affecting family and household: Secondary | ICD-10-CM | POA: Diagnosis not present

## 2023-06-27 DIAGNOSIS — I152 Hypertension secondary to endocrine disorders: Secondary | ICD-10-CM

## 2023-06-27 DIAGNOSIS — Z7984 Long term (current) use of oral hypoglycemic drugs: Secondary | ICD-10-CM

## 2023-06-27 LAB — BAYER DCA HB A1C WAIVED: HB A1C (BAYER DCA - WAIVED): 6.8 % — ABNORMAL HIGH (ref 4.8–5.6)

## 2023-06-28 NOTE — Progress Notes (Addendum)
Subjective: CC:Dm PCP: Raliegh Ip, DO [id number] [name]. is a 72 y.o. male presenting to clinic today for:  1. Type 2 Diabetes with hypertension, hyperlipidemia:  Glucometer: Currently using the freestyle libre 2.  He is only utilizing this for roughly 2 weeks/month so as to extend out his supply because they are expensive.  He is compliant with all medications but admits that he is not compliant with the diet and eats candy frequently.  He does not drink any sugared sodas but does drink either Coke 0 or diet Decaf soda at least a couple of times per day.  No structured physical activity reported.  He continues to have a high stress lifestyle with the illness of his wife and his 2 elderly parents, whom he tries to look after.    Last eye exam: due 07/21/2023 Last foot exam: UTD Last A1c:  Lab Results  Component Value Date   HGBA1C 6.8 (H) 06/27/2023   Nephropathy screen indicated?: UTD Last flu, zoster and/or pneumovax:  Immunization History  Administered Date(s) Administered   Influenza Inj Mdck Quad Pf 10/06/2017   Influenza, High Dose Seasonal PF 09/09/2018, 08/29/2019   Influenza,inj,Quad PF,6+ Mos 10/11/2019   Influenza-Unspecified 10/16/2016, 09/15/2020   Moderna Sars-Covid-2 Vaccination 01/12/2020, 02/09/2020, 11/06/2020   Pneumococcal Conjugate-13 10/08/2012, 10/11/2019   Pneumococcal Polysaccharide-23 11/08/2020   Tdap 06/15/2009, 11/12/2019   Zoster, Live 02/08/2013    ROS: Denies dizziness, LOC, polyuria, polydipsia, unintended weight loss/gain, foot ulcerations, numbness or tingling in extremities, shortness of breath or chest pain.  2. Ankle pain He has been having some right lateral ankle pain as of late.  Certain shoes exacerbate this pain.  No preceding injury  ROS: Per HPI  Allergies  Allergen Reactions   Sulfonamide Derivatives Swelling    SWELLING OF TONGUE   Erythromycin Other (See Comments)    Patient unsure reaction   Past Medical  History:  Diagnosis Date   Allergy    Anxiety states    Decreased free testosterone level in male    Depressive disorder, not elsewhere classified    DM (diabetes mellitus) ([facility])    Hemorrhage of rectum and anus    Hyperlipidemia    Hyperplasia of prostate    Hypertension    Polyp of colon     Current Outpatient Medications:    aspirin 81 MG EC tablet, Take 81 mg by mouth daily., Disp: , Rfl:    atorvastatin (LIPITOR) 20 MG tablet, TAKE 1 TABLET BY MOUTH EVERY DAY, Disp: 90 tablet, Rfl: 0   Blood Glucose Monitoring Suppl (ONETOUCH VERIO) w/Device KIT, 1 kit by Does not apply route daily., Disp: 1 kit, Rfl: 0   buPROPion (WELLBUTRIN XL) 300 MG 24 hr tablet, Take 1 tablet (300 mg total) by mouth daily., Disp: 90 tablet, Rfl: 3   clomiPHENE (CLOMID) 50 MG tablet, Take 25 mg by mouth daily., Disp: , Rfl:    Continuous Blood Gluc Sensor (FREESTYLE LIBRE 2 SENSOR) MISC, Use to check blood sugar continuously e11.69, Disp: 6 each, Rfl: 4   Dapagliflozin Pro-metFORMIN ER (XIGDUO XR) 04-999 MG TB24, TAKE 1 TABLET BY MOUTH TWICE A DAY IN THE MORNING AND IN THE EVENING, Disp: 180 tablet, Rfl: 0   diclofenac Sodium (VOLTAREN) 1 % GEL, APPLY 2 GRAMS TO AFFECTED AREA 4 TIMES A DAY, Disp: 400 g, Rfl: PRN   enalapril (VASOTEC) 5 MG tablet, TAKE 1 TABLET (5 MG TOTAL) BY MOUTH DAILY., Disp: 90 tablet, Rfl: 0   fluticasone (  FLONASE) 50 MCG/ACT nasal spray, SPRAY 2 SPRAYS INTO EACH NOSTRIL EVERY DAY, Disp: 48 mL, Rfl: 1   glucose blood (ONETOUCH VERIO) test strip, TEST BLOOD SUGAR TWICE A DAY Dx E11.9, Disp: 200 strip, Rfl: 3   glyBURIDE (DIABETA) 5 MG tablet, TAKE 1 TABLET BY MOUTH EVERY DAY WITH BREAKFAST, Disp: 90 tablet, Rfl: 1   OneTouch Delica Lancets 33G MISC, TEST BLOOD SUGAR TWICE A DAY Dx E11.9, Disp: 200 each, Rfl: 3   Semaglutide (RYBELSUS) 14 MG TABS, TAKE 1 TABLET BY MOUTH EVERY DAY BEFORE BREAKFAST, Disp: 90 tablet, Rfl: 0   sildenafil (REVATIO) 20 MG tablet, TAKE 2-5 TABLETS AS NEEDED PRIOR  TO SEXUAL ACTIVITY, Disp: 50 tablet, Rfl: 0 Social History   Socioeconomic History   Marital status: Married    Spouse name: Not on file   Number of children: Not on file   Years of education: Not on file   Highest education level: Not on file  Occupational History   Not on file  Tobacco Use   Smoking status: Never   Smokeless tobacco: Never  Vaping Use   Vaping status: Never Used  Substance and Sexual Activity   Alcohol use: No   Drug use: No   Sexual activity: Not on file  Other Topics Concern   Not on file  Social History Narrative   Not on file   Social Determinants of Health   Financial Resource Strain: Not on file  Food Insecurity: Not on file  Transportation Needs: Not on file  Physical Activity: Not on file  Stress: Not on file  Social Connections: Not on file  Intimate Partner Violence: Not on file   Family History  Problem Relation Age of Onset   Heart disease Father 10       CABG   Diabetes Brother    Cancer Brother 87       brain tumor   Colon cancer Paternal Uncle    Stroke Maternal Grandmother    Cancer Maternal Aunt        brain cancer   Diabetes Brother    Esophageal cancer Neg Hx    Rectal cancer Neg Hx    Stomach cancer Neg Hx     Objective: Office vital signs reviewed. BP 117/71   Pulse 87   Temp (!) 97.4 F (36.3 C) (Temporal)   Ht 5\' 10"  (1.778 m)   Wt 224 lb 6.4 oz (101.8 kg)   SpO2 94%   BMI 32.20 kg/m   Physical Examination:  General: Awake, alert, well nourished, No acute distress HEENT: sclera white, MMM Cardio: regular rate and rhythm, S1S2 heard, no murmurs appreciated Pulm: clear to auscultation bilaterally, no wheezes, rhonchi or rales; normal work of breathing on room air MSK: Ambulating independently with normal gait and station.  He has point tenderness to palpation to the posterior lateral aspect of the right ankle (over the calcaneofibular ligament).  There are no palpable deformities or defects.  Pain is  exacerbated with eversion of the ankle.     06/27/2023    3:56 PM 03/19/2023    3:54 PM 10/21/2022    4:24 PM  Depression screen PHQ 2/9  Decreased Interest 1 1 1   Down, Depressed, Hopeless 1 1 1   PHQ - 2 Score 2 2 2   Altered sleeping 0 0 0  Tired, decreased energy 1 1 0  Change in appetite 0 2 0  Feeling bad or failure about yourself  1 1 0  Trouble concentrating  1 1 0  Moving slowly or fidgety/restless 0 0 0  Suicidal thoughts 1 1 0  PHQ-9 Score 6 8 2   Difficult doing work/chores Not difficult at all Not difficult at all Not difficult at all      06/27/2023    3:56 PM 03/19/2023    3:49 PM 10/21/2022    4:24 PM 07/24/2022    4:45 PM  GAD 7 : Generalized Anxiety Score  Nervous, Anxious, on Edge 1 1 1 1   Control/stop worrying 1 1 1 1   Worry too much - different things 1 1 0 1  Trouble relaxing 2 1 0 1  Restless 1 0 0 0  Easily annoyed or irritable 2 2 1 1   Afraid - awful might happen 1 1 1 1   Total GAD 7 Score 9 7 4 6   Anxiety Difficulty Somewhat difficult Somewhat difficult Somewhat difficult Not difficult at all   Assessment/ Plan: 72 y.o. male   Type 2 diabetes mellitus with other specified complication, without long-term current use of insulin ([facility]) - Plan: Bayer DCA Hb A1c Waived  Long term (current) use of oral hypoglycemic drugs  Hyperlipidemia associated with type 2 diabetes mellitus (H[facility])  Hypertension associated with diabetes (H[facility])  Stress due to illness of family member  Acute right ankle pain  Sugar now controlled with A1c down to 6.8.  I have encouraged him to continue lifestyle modification, current medications.  I gave him a sample of Rybelsus 14 mg as well as some freestyle libre 3's as we did not have any to use in stock.  He is considering switching over to Ozempic and I encouraged him to contact me if he decides he wants to do this prior to her next visit.  I would start him at 0.5 mg given max dose of current Rybelsus.  We will revisit this conversation  at our next visit if he desires  Continue statin therapy.  Not due for fasting lipid  Blood pressure is controlled.  No changes needed  Unfortunately continues to have stress.  I did offer referral to integrated behavioral health for counseling services but he declined this at this time  I suspect right ankle pain is a likely stress to the calcaneal fibular ligaments.  His shoes are quite old and we discussed trying to get a pair of shoes that would neutralize the ankle.  He will let me know if he decides he wants to pursue prescription orthotics  Orders Placed This Encounter  Procedures   Bayer DCA Hb A1c Waived   No orders of the defined types were placed in this encounter.    Raliegh Ip, DO [facility] [phone]

## 2023-07-27 ENCOUNTER — Other Ambulatory Visit: Payer: Self-pay | Admitting: Family Medicine

## 2023-07-27 DIAGNOSIS — E1159 Type 2 diabetes mellitus with other circulatory complications: Secondary | ICD-10-CM

## 2023-08-13 ENCOUNTER — Encounter: Payer: Self-pay | Admitting: Family Medicine

## 2023-08-22 ENCOUNTER — Ambulatory Visit (INDEPENDENT_AMBULATORY_CARE_PROVIDER_SITE_OTHER): Payer: Medicare Other

## 2023-08-22 DIAGNOSIS — E1169 Type 2 diabetes mellitus with other specified complication: Secondary | ICD-10-CM

## 2023-08-22 DIAGNOSIS — E089 Diabetes mellitus due to underlying condition without complications: Secondary | ICD-10-CM

## 2023-08-22 DIAGNOSIS — E1165 Type 2 diabetes mellitus with hyperglycemia: Secondary | ICD-10-CM

## 2023-08-22 LAB — HM DIABETES EYE EXAM

## 2023-08-22 NOTE — Progress Notes (Signed)
Richard Nelson. arrived 08/22/2023 and has given verbal consent to obtain images and complete their overdue diabetic retinal screening.  The images have been sent to an ophthalmologist or optometrist for review and interpretation.  Results will be sent back to Raliegh Ip, DO for review.  Patient has been informed they will be contacted when we receive the results via telephone or MyChart

## 2023-08-26 ENCOUNTER — Telehealth: Payer: Self-pay

## 2023-08-26 ENCOUNTER — Other Ambulatory Visit (HOSPITAL_COMMUNITY): Payer: Self-pay

## 2023-08-26 NOTE — Telephone Encounter (Signed)
worley kerschner jr (Key: [id number]) Rybelsus 14MG  tablets Form Photographer PA Form [phone] NCPDP) Created 3 days ago Sent to Plan 2 minutes ago Plan Response 2 minutes ago Submit Clinical Questions Determination Message from Plan Available without authorization.

## 2023-08-26 NOTE — Telephone Encounter (Signed)
Pharmacy Patient Advocate Encounter   Received notification from CoverMyMeds that prior authorization for Xigduo 5-1000mg  ER is required/requested.   Insurance verification completed.   The patient is insured through [employer] .   Per test claim: PA required; PA submitted to [employer] via CoverMyMeds Key/confirmation #/EOC [id number] Status is pending

## 2023-08-27 ENCOUNTER — Other Ambulatory Visit (HOSPITAL_COMMUNITY): Payer: Self-pay

## 2023-08-27 NOTE — Telephone Encounter (Signed)
Can you help?

## 2023-08-27 NOTE — Telephone Encounter (Signed)
Pharmacy Patient Advocate Encounter  Received notification from [facility] that Prior Authorization for Rybelsus 14MG  tablets has been CANCELLED due to: Available without authorization.    Ran test claim, came back refill too soon. Last filled 06/26/23. Next refill is 09/11/23.

## 2023-08-27 NOTE — Telephone Encounter (Signed)
Pharmacy Patient Advocate Encounter  Received notification from [facility] that Prior Authorization for Richard Nelson has been DENIED.  Full denial letter will be uploaded to the media tab. See denial reason below.   PA #/Case ID/Reference #:  [id number]

## 2023-09-04 ENCOUNTER — Other Ambulatory Visit: Payer: Self-pay | Admitting: Family Medicine

## 2023-09-04 DIAGNOSIS — E1169 Type 2 diabetes mellitus with other specified complication: Secondary | ICD-10-CM

## 2023-09-19 NOTE — Telephone Encounter (Signed)
Xigduo xr approved  Please let patient and pharmacy know

## 2023-09-19 NOTE — Telephone Encounter (Signed)
Left detailed message to patient and pharmacy

## 2023-09-21 ENCOUNTER — Other Ambulatory Visit: Payer: Self-pay | Admitting: Family Medicine

## 2023-09-21 DIAGNOSIS — E1165 Type 2 diabetes mellitus with hyperglycemia: Secondary | ICD-10-CM

## 2023-09-26 ENCOUNTER — Other Ambulatory Visit: Payer: Self-pay | Admitting: Family Medicine

## 2023-09-26 DIAGNOSIS — E1165 Type 2 diabetes mellitus with hyperglycemia: Secondary | ICD-10-CM

## 2023-10-07 ENCOUNTER — Ambulatory Visit: Payer: Medicare Other | Admitting: Family Medicine

## 2023-10-12 ENCOUNTER — Other Ambulatory Visit: Payer: Self-pay | Admitting: Family Medicine

## 2023-10-12 DIAGNOSIS — E1159 Type 2 diabetes mellitus with other circulatory complications: Secondary | ICD-10-CM

## 2023-10-12 DIAGNOSIS — E1165 Type 2 diabetes mellitus with hyperglycemia: Secondary | ICD-10-CM

## 2023-10-13 ENCOUNTER — Encounter: Payer: Self-pay | Admitting: Family Medicine

## 2023-10-13 ENCOUNTER — Ambulatory Visit (INDEPENDENT_AMBULATORY_CARE_PROVIDER_SITE_OTHER): Payer: BC Managed Care – PPO | Admitting: Family Medicine

## 2023-10-13 VITALS — BP 127/61 | HR 87 | Temp 98.5°F | Ht 70.0 in | Wt 227.0 lb

## 2023-10-13 DIAGNOSIS — N529 Male erectile dysfunction, unspecified: Secondary | ICD-10-CM | POA: Diagnosis not present

## 2023-10-13 DIAGNOSIS — Z636 Dependent relative needing care at home: Secondary | ICD-10-CM

## 2023-10-13 DIAGNOSIS — E119 Type 2 diabetes mellitus without complications: Secondary | ICD-10-CM

## 2023-10-13 DIAGNOSIS — E1159 Type 2 diabetes mellitus with other circulatory complications: Secondary | ICD-10-CM | POA: Diagnosis not present

## 2023-10-13 DIAGNOSIS — E1169 Type 2 diabetes mellitus with other specified complication: Secondary | ICD-10-CM | POA: Diagnosis not present

## 2023-10-13 DIAGNOSIS — E785 Hyperlipidemia, unspecified: Secondary | ICD-10-CM

## 2023-10-13 DIAGNOSIS — Z7984 Long term (current) use of oral hypoglycemic drugs: Secondary | ICD-10-CM

## 2023-10-13 DIAGNOSIS — I152 Hypertension secondary to endocrine disorders: Secondary | ICD-10-CM

## 2023-10-13 MED ORDER — GLYBURIDE 5 MG PO TABS: 5.0000 mg | ORAL_TABLET | Freq: Every day | ORAL | 3 refills | Status: DC

## 2023-10-13 MED ORDER — ENALAPRIL MALEATE 5 MG PO TABS: 5.0000 mg | ORAL_TABLET | Freq: Every day | ORAL | 3 refills | Status: DC

## 2023-10-13 MED ORDER — DAPAGLIFLOZIN PRO-METFORMIN ER 5-1000 MG PO TB24: 1.0000 | ORAL_TABLET | Freq: Two times a day (BID) | ORAL | 3 refills | Status: DC

## 2023-10-13 MED ORDER — ATORVASTATIN CALCIUM 20 MG PO TABS: 20.0000 mg | ORAL_TABLET | Freq: Every day | ORAL | 3 refills | Status: DC

## 2023-10-13 MED ORDER — SILDENAFIL CITRATE 100 MG PO TABS: 50.0000 mg | ORAL_TABLET | Freq: Every day | ORAL | 11 refills | Status: DC | PRN

## 2023-10-13 MED ORDER — RYBELSUS 14 MG PO TABS: 14.0000 mg | ORAL_TABLET | Freq: Every morning | ORAL | 3 refills | Status: DC

## 2023-10-13 NOTE — Patient Instructions (Signed)
I'll get Arlys John to call or send you a Mychart message if he can accommodate a 4pm appt for counseling services.

## 2023-10-13 NOTE — Progress Notes (Signed)
Subjective: CC:DM PCP: Raliegh Ip, DO HQI:ONGEXBM Richard Nelson. is a 72 y.o. male presenting to clinic today for:  1. Type 2 Diabetes with hypertension, hyperlipidemia:  Compliant with all meds but not compliant with a diet. Still eats fudge rounds/ sugary beverages, etc.  Has been under a lot of stress financially and simply because his wife's health continues to decline. Did not see Arlys John, kept missing the call to set up appt.  Diabetes Health Maintenance Due  Topic Date Due   FOOT EXAM  07/25/2023   HEMOGLOBIN A1C  12/28/2023   OPHTHALMOLOGY EXAM  08/21/2024    Last A1c:  Lab Results  Component Value Date   HGBA1C 6.8 (H) 06/27/2023    ROS: no CP, SOB, edema, falls.   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    Depressive disorder, not elsewhere classified    DM (diabetes mellitus) (HCC)    Hemorrhage of rectum and anus    Hyperlipidemia    Hyperplasia of prostate    Hypertension    Polyp of colon     Current Outpatient Medications:    aspirin 81 MG EC tablet, Take 81 mg by mouth daily., Disp: , Rfl:    atorvastatin (LIPITOR) 20 MG tablet, TAKE 1 TABLET BY MOUTH EVERY DAY, Disp: 90 tablet, Rfl: 0   Blood Glucose Monitoring Suppl (ONETOUCH VERIO) w/Device KIT, 1 kit by Does not apply route daily., Disp: 1 kit, Rfl: 0   buPROPion (WELLBUTRIN XL) 300 MG 24 hr tablet, Take 1 tablet (300 mg total) by mouth daily., Disp: 90 tablet, Rfl: 3   clomiPHENE (CLOMID) 50 MG tablet, Take 25 mg by mouth daily., Disp: , Rfl:    Continuous Blood Gluc Sensor (FREESTYLE LIBRE 2 SENSOR) MISC, Use to check blood sugar continuously e11.69, Disp: 6 each, Rfl: 4   diclofenac Sodium (VOLTAREN) 1 % GEL, APPLY 2 GRAMS TO AFFECTED AREA 4 TIMES A  DAY, Disp: 400 g, Rfl: PRN   enalapril (VASOTEC) 5 MG tablet, TAKE 1 TABLET (5 MG TOTAL) BY MOUTH DAILY., Disp: 90 tablet, Rfl: 0   fluticasone (FLONASE) 50 MCG/ACT nasal spray, SPRAY 2 SPRAYS INTO EACH NOSTRIL EVERY DAY, Disp: 48 mL, Rfl: 1   glucose blood (ONETOUCH VERIO) test strip, TEST BLOOD SUGAR TWICE A DAY Dx E11.9, Disp: 200 strip, Rfl: 3   glyBURIDE (DIABETA) 5 MG tablet, TAKE 1 TABLET BY MOUTH EVERY DAY WITH BREAKFAST, Disp: 90 tablet, Rfl: 1   OneTouch Delica Lancets 33G MISC, TEST BLOOD SUGAR TWICE A DAY Dx E11.9, Disp: 200 each, Rfl: 3   RYBELSUS 14 MG TABS, TAKE 1 TABLET BY MOUTH EVERY DAY BEFORE BREAKFAST, Disp: 90 tablet, Rfl: 0   sildenafil (REVATIO) 20 MG tablet, TAKE 2-5 TABLETS AS NEEDED PRIOR TO SEXUAL ACTIVITY, Disp: 50 tablet, Rfl: 0   XIGDUO XR 04-999 MG TB24, TAKE 1 TABLET BY MOUTH TWICE A DAY IN THE MORNING AND IN THE EVENING, Disp: 180 tablet, Rfl: 0 Social History   Socioeconomic History   Marital status: Married    Spouse name: Not on file   Number of children: Not on file   Years of education: Not on file   Highest education level:  Not on file  Occupational History   Not on file  Tobacco Use   Smoking status: Never   Smokeless tobacco: Never  Vaping Use   Vaping status: Never Used  Substance and Sexual Activity   Alcohol use: No   Drug use: No   Sexual activity: Not on file  Other Topics Concern   Not on file  Social History Narrative   Not on file   Social Determinants of Health   Financial Resource Strain: Not on file  Food Insecurity: Not on file  Transportation Needs: Not on file  Physical Activity: Not on file  Stress: Not on file  Social Connections: Not on file  Intimate Partner Violence: Not on file   Family History  Problem Relation Age of Onset   Heart disease Father 48       CABG   Diabetes Brother    Cancer Brother 14       brain tumor   Colon cancer Paternal Uncle    Stroke Maternal Grandmother    Cancer Maternal Aunt         brain cancer   Diabetes Brother    Esophageal cancer Neg Hx    Rectal cancer Neg Hx    Stomach cancer Neg Hx     Objective: Office vital signs reviewed. BP 127/61   Pulse 87   Temp 98.5 Richard (36.9 C)   Ht 5\' 10"  (1.778 m)   Wt 227 lb (103 kg)   SpO2 96%   BMI 32.57 kg/m   Physical Examination:  General: Awake, alert, well nourished, No acute distress HEENT: sclera white, MMM Cardio: regular rate and rhythm, S1S2 heard, no murmurs appreciated Pulm: clear to auscultation bilaterally, no wheezes, rhonchi or rales; normal work of breathing on room air    Diabetic Foot Exam - Simple   Simple Foot Form Diabetic Foot exam was performed with the following findings: Yes 10/13/2023  4:54 PM  Visual Inspection No deformities, no ulcerations, no other skin breakdown bilaterally: Yes Sensation Testing Intact to touch and monofilament testing bilaterally: Yes Pulse Check Posterior Tibialis and Dorsalis pulse intact bilaterally: Yes Comments    Assessment/ Plan: 72 y.o. male   Diabetes mellitus treated with oral medication (HCC) - Plan: Bayer DCA Hb A1c Waived, Microalbumin / creatinine urine ratio, glyBURIDE (DIABETA) 5 MG tablet, Semaglutide (RYBELSUS) 14 MG TABS, Dapagliflozin Pro-metFORMIN ER (XIGDUO XR) 04-999 MG TB24  Hyperlipidemia associated with type 2 diabetes mellitus (HCC) - Plan: CMP14+EGFR, Lipid Panel, atorvastatin (LIPITOR) 20 MG tablet  Hypertension associated with diabetes (HCC) - Plan: CMP14+EGFR, Lipid Panel, enalapril (VASOTEC) 5 MG tablet  Erectile dysfunction, unspecified erectile dysfunction type - Plan: sildenafil (VIAGRA) 100 MG tablet  Caregiver stress  A1c has risen to 7.2. Reinforced carb restriction. All meds renewed.  Nonfasting lipid.  Continue current meds. Refill sent  BP controlled. 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

## 2023-10-14 LAB — LIPID PANEL
Chol/HDL Ratio: 2.7 ratio (ref 0.0–5.0)
Cholesterol, Total: 116 mg/dL (ref 100–199)
HDL: 43 mg/dL (ref >39)
LDL Chol Calc (NIH): 45 mg/dL (ref 0–99)
Triglycerides: 169 mg/dL — ABNORMAL HIGH (ref 0–149)
VLDL Cholesterol Cal: 28 mg/dL (ref 5–40)

## 2023-10-14 LAB — CMP14+EGFR
ALT: 18 [IU]/L (ref 0–44)
AST: 19 [IU]/L (ref 0–40)
Albumin: 4.4 g/dL (ref 3.9–4.9)
Alkaline Phosphatase: 116 [IU]/L (ref 44–121)
BUN/Creatinine Ratio: 11 (ref 10–24)
BUN: 9 mg/dL (ref 8–27)
Bilirubin Total: 0.4 mg/dL (ref 0.0–1.2)
CO2: 17 mmol/L — ABNORMAL LOW (ref 20–29)
Calcium: 9.5 mg/dL (ref 8.6–10.2)
Chloride: 104 mmol/L (ref 96–106)
Creatinine, Ser: 0.81 mg/dL (ref 0.76–1.27)
Globulin, Total: 2.1 g/dL (ref 1.5–4.5)
Glucose: 93 mg/dL (ref 70–99)
Potassium: 4 mmol/L (ref 3.5–5.2)
Sodium: 143 mmol/L (ref 134–144)
Total Protein: 6.5 g/dL (ref 6.0–8.5)
eGFR: 95 mL/min/{1.73_m2} (ref >59)

## 2023-10-14 LAB — MICROALBUMIN / CREATININE URINE RATIO
Creatinine, Urine: 64.1 mg/dL
Microalb/Creat Ratio: 9 mg/g{creat} (ref 0–29)
Microalbumin, Urine: 5.6 ug/mL

## 2023-10-14 LAB — BAYER DCA HB A1C WAIVED: HB A1C (BAYER DCA - WAIVED): 7.2 % — ABNORMAL HIGH (ref 4.8–5.6)

## 2024-01-13 ENCOUNTER — Ambulatory Visit: Payer: Medicare Other | Admitting: Family Medicine

## 2024-01-14 ENCOUNTER — Ambulatory Visit: Payer: Medicare Other | Admitting: Family Medicine

## 2024-01-31 ENCOUNTER — Other Ambulatory Visit: Payer: Self-pay | Admitting: Family Medicine

## 2024-01-31 DIAGNOSIS — Z6379 Other stressful life events affecting family and household: Secondary | ICD-10-CM

## 2024-01-31 DIAGNOSIS — M25569 Pain in unspecified knee: Secondary | ICD-10-CM

## 2024-02-02 ENCOUNTER — Ambulatory Visit (INDEPENDENT_AMBULATORY_CARE_PROVIDER_SITE_OTHER): Payer: BC Managed Care – PPO | Admitting: Family Medicine

## 2024-02-02 ENCOUNTER — Encounter: Payer: Self-pay | Admitting: Family Medicine

## 2024-02-02 VITALS — BP 121/65 | HR 83 | Temp 97.6°F | Ht 70.0 in | Wt 229.0 lb

## 2024-02-02 DIAGNOSIS — E119 Type 2 diabetes mellitus without complications: Secondary | ICD-10-CM

## 2024-02-02 DIAGNOSIS — Z7984 Long term (current) use of oral hypoglycemic drugs: Secondary | ICD-10-CM | POA: Diagnosis not present

## 2024-02-02 DIAGNOSIS — Z636 Dependent relative needing care at home: Secondary | ICD-10-CM

## 2024-02-02 DIAGNOSIS — E1169 Type 2 diabetes mellitus with other specified complication: Secondary | ICD-10-CM | POA: Diagnosis not present

## 2024-02-02 DIAGNOSIS — E1159 Type 2 diabetes mellitus with other circulatory complications: Secondary | ICD-10-CM

## 2024-02-02 DIAGNOSIS — I152 Hypertension secondary to endocrine disorders: Secondary | ICD-10-CM

## 2024-02-02 DIAGNOSIS — N528 Other male erectile dysfunction: Secondary | ICD-10-CM

## 2024-02-02 DIAGNOSIS — E785 Hyperlipidemia, unspecified: Secondary | ICD-10-CM

## 2024-02-02 LAB — BAYER DCA HB A1C WAIVED: HB A1C (BAYER DCA - WAIVED): 8.1 % — ABNORMAL HIGH (ref 4.8–5.6)

## 2024-02-02 MED ORDER — OZEMPIC (0.25 OR 0.5 MG/DOSE) 2 MG/3ML ~~LOC~~ SOPN: 0.5000 mg | PEN_INJECTOR | SUBCUTANEOUS | 3 refills | Status: DC

## 2024-02-02 MED ORDER — SILDENAFIL CITRATE 100 MG PO TABS: 50.0000 mg | ORAL_TABLET | Freq: Every day | ORAL | 11 refills | Status: DC | PRN

## 2024-02-02 NOTE — Progress Notes (Signed)
 Subjective: CC:DM PCP: Raliegh Ip, DO HYQ:MVHQION F Gaylan Fauver. is a 73 y.o. male presenting to clinic today for:  1. Type 2 Diabetes with hypertension, hyperlipidemia:  Patient is compliant with medications.  He admits he does not follow a strict diet though.  He often eats what he wants and admits that he does some emotional eating.  He remains under high amounts of stress at home.  He denies any GI side effects or genitourinary concerns.  He is continuing to suffer from erectile dysfunction and wants to know if we can continue to prescribe Viagra.  Diabetes Health Maintenance Due  Topic Date Due   HEMOGLOBIN A1C  04/12/2024   OPHTHALMOLOGY EXAM  08/21/2024   FOOT EXAM  10/12/2024    Last A1c:  Lab Results  Component Value Date   HGBA1C 7.2 (H) 10/13/2023    ROS: No chest pain, shortness of breath or dizziness reported.  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    Depressive disorder, not elsewhere classified    DM (diabetes mellitus) (HCC)    Hemorrhage of rectum and anus    Hyperlipidemia    Hyperplasia of prostate    Hypertension    Polyp of colon     Current Outpatient Medications:    aspirin 81 MG EC tablet, Take 81 mg by mouth daily., Disp: , Rfl:    atorvastatin (LIPITOR) 20 MG tablet, Take 1 tablet (20 mg total) by mouth daily., Disp: 100 tablet, Rfl: 3   Blood Glucose Monitoring Suppl (ONETOUCH VERIO) w/Device KIT, 1 kit by Does not apply route daily., Disp: 1 kit, Rfl: 0   buPROPion (WELLBUTRIN XL) 300 MG 24 hr tablet, Take 1 tablet (300 mg total) by mouth daily., Disp: 90 tablet, Rfl: 3   clomiPHENE (CLOMID) 50 MG tablet, Take 25 mg by mouth daily., Disp: , Rfl:    Continuous Blood Gluc Sensor (FREESTYLE  LIBRE 2 SENSOR) MISC, Use to check blood sugar continuously e11.69, Disp: 6 each, Rfl: 4   Dapagliflozin Pro-metFORMIN ER (XIGDUO XR) 04-999 MG TB24, Take 1 tablet by mouth in the morning and at bedtime., Disp: 200 tablet, Rfl: 3   diclofenac Sodium (VOLTAREN) 1 % GEL, APPLY 2 GRAMS TO AFFECTED AREA 4 TIMES A DAY, Disp: 400 g, Rfl: PRN   enalapril (VASOTEC) 5 MG tablet, Take 1 tablet (5 mg total) by mouth daily., Disp: 100 tablet, Rfl: 3   fluticasone (FLONASE) 50 MCG/ACT nasal spray, SPRAY 2 SPRAYS INTO EACH NOSTRIL EVERY DAY, Disp: 48 mL, Rfl: 1   glucose blood (ONETOUCH VERIO) test strip, TEST BLOOD SUGAR TWICE A DAY Dx E11.9, Disp: 200 strip, Rfl: 3   glyBURIDE (DIABETA) 5 MG tablet, Take 1 tablet (5 mg total) by mouth daily with breakfast., Disp: 100 tablet, Rfl: 3   OneTouch Delica Lancets 33G MISC, TEST BLOOD SUGAR TWICE A DAY Dx E11.9, Disp: 200 each, Rfl: 3   Semaglutide (RYBELSUS) 14 MG TABS, Take 1 tablet (14 mg total) by mouth in the morning., Disp: 100 tablet, Rfl: 3   sildenafil (VIAGRA) 100 MG tablet, Take 0.5-1 tablets (50-100 mg total) by mouth daily as needed for erectile dysfunction., Disp: 5 tablet, Rfl: 11 Social History  Socioeconomic History   Marital status: Married    Spouse name: Not on file   Number of children: Not on file   Years of education: Not on file   Highest education level: Not on file  Occupational History   Not on file  Tobacco Use   Smoking status: Never   Smokeless tobacco: Never  Vaping Use   Vaping status: Never Used  Substance and Sexual Activity   Alcohol use: No   Drug use: No   Sexual activity: Not on file  Other Topics Concern   Not on file  Social History Narrative   Not on file   Social Drivers of Health   Financial Resource Strain: Not on file  Food Insecurity: Not on file  Transportation Needs: Not on file  Physical Activity: Not on file  Stress: Not on file  Social Connections: Not on file  Intimate Partner Violence: Not  on file   Family History  Problem Relation Age of Onset   Heart disease Father 73       CABG   Diabetes Brother    Cancer Brother 46       brain tumor   Colon cancer Paternal Uncle    Stroke Maternal Grandmother    Cancer Maternal Aunt        brain cancer   Diabetes Brother    Esophageal cancer Neg Hx    Rectal cancer Neg Hx    Stomach cancer Neg Hx     Objective: Office vital signs reviewed. BP 121/65   Pulse 83   Temp 97.6 F (36.4 C)   Ht 5\' 10"  (1.778 m)   Wt 229 lb (103.9 kg)   SpO2 98%   BMI 32.86 kg/m   Physical Examination:  General: Awake, alert, well nourished, No acute distress HEENT: sclera white, MMM Cardio: regular rate and rhythm, S1S2 heard, no murmurs appreciated Pulm: clear to auscultation bilaterally, no wheezes, rhonchi or rales; normal work of breathing on room air      02/02/2024   10:28 AM 10/13/2023    4:15 PM 06/27/2023    3:56 PM  Depression screen PHQ 2/9  Decreased Interest 1 1 1   Down, Depressed, Hopeless 1 1 1   PHQ - 2 Score 2 2 2   Altered sleeping 1 1 0  Tired, decreased energy 1 1 1   Change in appetite 1 1 0  Feeling bad or failure about yourself  0 1 1  Trouble concentrating 2 1 1   Moving slowly or fidgety/restless 0 0 0  Suicidal thoughts 1 1 1   PHQ-9 Score 8 8 6   Difficult doing work/chores Somewhat difficult Somewhat difficult Not difficult at all      02/02/2024   10:29 AM 10/13/2023    4:15 PM 06/27/2023    3:56 PM 03/19/2023    3:49 PM  GAD 7 : Generalized Anxiety Score  Nervous, Anxious, on Edge 0 2 1 1   Control/stop worrying 0 2 1 1   Worry too much - different things 0 2 1 1   Trouble relaxing 2 3 2 1   Restless 0 1 1 0  Easily annoyed or irritable 2 3 2 2   Afraid - awful might happen 2 3 1 1   Total GAD 7 Score 6 16 9 7   Anxiety Difficulty Not difficult at all  Somewhat difficult Somewhat difficult    Assessment/ Plan: 73 y.o. male   Diabetes mellitus treated with oral medication (HCC) - Plan: Bayer DCA Hb  A1c Waived, Semaglutide,0.25  or 0.5MG /DOS, (OZEMPIC, 0.25 OR 0.5 MG/DOSE,) 2 MG/3ML SOPN  Hyperlipidemia associated with type 2 diabetes mellitus (HCC)  Hypertension associated with diabetes (HCC)  Caregiver stress - Plan: Ambulatory referral to Integrated Behavioral Health  Other male erectile dysfunction - Plan: sildenafil (VIAGRA) 100 MG tablet  Would like him to discontinue Rybelsus and were going to advance him to 0.5 mg of semaglutide subcutaneously weekly.  New Rx sent.  A1c unfortunately has risen to 8.1.  Anticipate will likely need higher doses even in the 0.5 of semaglutide but will plan to reassess again in 3 months and see where we get him.  He will continue statin, current blood pressure medications.  I have referred him again to integrated behavioral health and reached out to [place] with regards to getting him seen virtually more urgently as he certainly continues to have a lot of emotional impact on his eating  I have renewed his Viagra.  I have increased number of pills that he can get per month  Itha Kroeker Hulen Skains, DO [facility] [phone]

## 2024-02-02 NOTE — Patient Instructions (Signed)
Tips for success with Ozempic (and by success, how not to be super sick on your stomach): Eat small meals AVOID heavy foods (fried/ high in carbs like bread, pasta, rice) AVOID carbonated beverages (soda/ beer, as these can increase bloating) DOUBLE your water intake (will help you avoid constipation/ dehydration)  Ozempic CAN cause: Nausea Abdominal pain Increased acid reflux (sometimes presents as "sour burps") Constipation OR Diarrhea Fatigue (especially when you first start it)

## 2024-03-30 ENCOUNTER — Telehealth: Payer: Self-pay | Admitting: Family Medicine

## 2024-05-14 ENCOUNTER — Ambulatory Visit

## 2024-05-24 ENCOUNTER — Ambulatory Visit: Admitting: Family Medicine

## 2024-05-24 ENCOUNTER — Encounter: Payer: Self-pay | Admitting: Family Medicine

## 2024-05-24 ENCOUNTER — Ambulatory Visit (INDEPENDENT_AMBULATORY_CARE_PROVIDER_SITE_OTHER): Admitting: Family Medicine

## 2024-05-24 VITALS — BP 120/69 | HR 78 | Temp 98.7°F | Ht 70.0 in | Wt 225.0 lb

## 2024-05-24 DIAGNOSIS — Z636 Dependent relative needing care at home: Secondary | ICD-10-CM

## 2024-05-24 DIAGNOSIS — E1159 Type 2 diabetes mellitus with other circulatory complications: Secondary | ICD-10-CM

## 2024-05-24 DIAGNOSIS — Z7985 Long-term (current) use of injectable non-insulin antidiabetic drugs: Secondary | ICD-10-CM

## 2024-05-24 DIAGNOSIS — R7309 Other abnormal glucose: Secondary | ICD-10-CM | POA: Diagnosis not present

## 2024-05-24 DIAGNOSIS — I152 Hypertension secondary to endocrine disorders: Secondary | ICD-10-CM

## 2024-05-24 DIAGNOSIS — E119 Type 2 diabetes mellitus without complications: Secondary | ICD-10-CM

## 2024-05-24 DIAGNOSIS — E1169 Type 2 diabetes mellitus with other specified complication: Secondary | ICD-10-CM

## 2024-05-24 DIAGNOSIS — E785 Hyperlipidemia, unspecified: Secondary | ICD-10-CM

## 2024-05-24 LAB — BAYER DCA HB A1C WAIVED: HB A1C (BAYER DCA - WAIVED): 7.1 % — ABNORMAL HIGH (ref 4.8–5.6)

## 2024-05-24 MED ORDER — SEMAGLUTIDE (1 MG/DOSE) 4 MG/3ML ~~LOC~~ SOPN: 1.0000 mg | PEN_INJECTOR | SUBCUTANEOUS | 3 refills | Status: DC

## 2024-05-24 NOTE — Progress Notes (Signed)
 Subjective: CC:DM PCP: Eliodoro Guerin, DO JYN:WGNFAOZ [name]. is a 73 y.o. male presenting to clinic today for:  1. Type 2 Diabetes with hypertension, hyperlipidemia:  Transitioned from rybelsus  to Ozempic  in February.  He does report ongoing bloating, early satiety with the medication.  He is really not sure how blood sugars have been looking because he really has not been compliant and continues to eat things like fudge rounds and drink sodas.  He reports occasional explosive bowel movements if he eats certain things on his way to work.  He reports no nausea, vomiting or abdominal pain  Diabetes Health Maintenance Due  Topic Date Due   HEMOGLOBIN A1C  08/01/2024   OPHTHALMOLOGY EXAM  08/21/2024   FOOT EXAM  10/12/2024    Last A1c:  Lab Results  Component Value Date   HGBA1C 8.1 (H) 02/02/2024   2.  Stress He continues to have quite a bit of caregiver stress.  He does admit that his wife is doing a little more by cooking but she is often very dependent on him for everything and he feels overwhelmed by this.  He is compliant with his medications.  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  ROS: Per HPI  Allergies  Allergen Reactions   Sulfonamide Derivatives Swelling    SWELLING OF TONGUE   Erythromycin Other (See Comments)    Patient unsure reaction   Past Medical History:  Diagnosis Date   Allergy    Anxiety states    Decreased free testosterone  level in male    Depressive disorder, not elsewhere classified    DM (diabetes mellitus) (HCC)    Hemorrhage of rectum and anus    Hyperlipidemia    Hyperplasia of prostate    Hypertension    Polyp of colon     Current Outpatient Medications:    aspirin 81 MG EC tablet, Take 81 mg by mouth daily., Disp: , Rfl:    atorvastatin  (LIPITOR) 20 MG tablet, Take 1 tablet (20 mg total) by mouth daily., Disp: 100 tablet, Rfl: 3   Blood Glucose Monitoring Suppl (ONETOUCH VERIO) w/Device KIT, 1  kit by Does not apply route daily., Disp: 1 kit, Rfl: 0   buPROPion  (WELLBUTRIN  XL) 300 MG 24 hr tablet, TAKE 1 TABLET BY MOUTH EVERY DAY, Disp: 90 tablet, Rfl: 3   clomiPHENE (CLOMID) 50 MG tablet, Take 25 mg by mouth daily., Disp: , Rfl:    Continuous Blood Gluc Sensor (FREESTYLE LIBRE 2 SENSOR) MISC, Use to check blood sugar continuously e11.69, Disp: 6 each, Rfl: 4   Dapagliflozin  Pro-metFORMIN  ER (XIGDUO  XR) 04-999 MG TB24, Take 1 tablet by mouth in the morning and at bedtime., Disp: 200 tablet, Rfl: 3   diclofenac  Sodium (VOLTAREN ) 1 % GEL, APPLY 2 GRAMS TO AFFECTED AREA 4 TIMES A DAY, Disp: 400 g, Rfl: PRN   enalapril  (VASOTEC ) 5 MG tablet, Take 1 tablet (5 mg total) by mouth daily., Disp: 100 tablet, Rfl: 3   fluticasone  (FLONASE ) 50 MCG/ACT nasal spray, SPRAY 2 SPRAYS INTO EACH NOSTRIL EVERY DAY, Disp: 48 mL, Rfl: 1   glucose blood (ONETOUCH VERIO) test strip, TEST BLOOD SUGAR TWICE A DAY Dx E11.9, Disp: 200 strip, Rfl: 3   glyBURIDE  (DIABETA ) 5 MG tablet, Take 1 tablet (5 mg total) by mouth daily with breakfast., Disp: 100 tablet, Rfl: 3   OneTouch Delica Lancets 33G MISC, TEST BLOOD SUGAR TWICE A DAY Dx E11.9, Disp: 200  each, Rfl: 3   Semaglutide ,0.25 or 0.5MG /DOS, (OZEMPIC , 0.25 OR 0.5 MG/DOSE,) 2 MG/3ML SOPN, Inject 0.5 mg into the skin every 7 (seven) days. STOP rybelsus . Start Ozempic  the following day., Disp: 9 mL, Rfl: 3   sildenafil  (VIAGRA ) 100 MG tablet, Take 0.5-1 tablets (50-100 mg total) by mouth daily as needed for erectile dysfunction., Disp: 30 tablet, Rfl: 11 Social History   Socioeconomic History   Marital status: Married    Spouse name: Not on file   Number of children: Not on file   Years of education: Not on file   Highest education level: Not on file  Occupational History   Not on file  Tobacco Use   Smoking status: Never   Smokeless tobacco: Never  Vaping Use   Vaping status: Never Used  Substance and Sexual Activity   Alcohol use: No   Drug use: No    Sexual activity: Not on file  Other Topics Concern   Not on file  Social History Narrative   Not on file   Social Drivers of Health   Financial Resource Strain: Not on file  Food Insecurity: Not on file  Transportation Needs: Not on file  Physical Activity: Not on file  Stress: Not on file  Social Connections: Not on file  Intimate Partner Violence: Not on file   Family History  Problem Relation Age of Onset   Heart disease Father 25       CABG   Diabetes Brother    Cancer Brother 67       brain tumor   Colon cancer Paternal Uncle    Stroke Maternal Grandmother    Cancer Maternal Aunt        brain cancer   Diabetes Brother    Esophageal cancer Neg Hx    Rectal cancer Neg Hx    Stomach cancer Neg Hx     Objective: Office vital signs reviewed. BP 120/69   Pulse 78   Temp 98.7 F (37.1 C)   Ht 5\' 10"  (1.778 m)   Wt 225 lb (102.1 kg)   SpO2 98%   BMI 32.28 kg/m   Physical Examination:  General: Awake, alert, obese, No acute distress HEENT: sclera white, MMM Cardio: regular rate and rhythm, S1S2 heard, no murmurs appreciated Pulm: clear to auscultation bilaterally, no wheezes, rhonchi or rales; normal work of breathing on room air      05/24/2024   10:11 AM 02/02/2024   10:28 AM 10/13/2023    4:15 PM  Depression screen PHQ 2/9  Decreased Interest 1 1 1   Down, Depressed, Hopeless 1 1 1   PHQ - 2 Score 2 2 2   Altered sleeping 1 1 1   Tired, decreased energy 1 1 1   Change in appetite 1 1 1   Feeling bad or failure about yourself  1 0 1  Trouble concentrating 0 2 1  Moving slowly or fidgety/restless 0 0 0  Suicidal thoughts 1 1 1   PHQ-9 Score 7 8 8   Difficult doing work/chores Somewhat difficult Somewhat difficult Somewhat difficult      05/24/2024   10:10 AM 02/02/2024   10:29 AM 10/13/2023    4:15 PM 06/27/2023    3:56 PM  GAD 7 : Generalized Anxiety Score  Nervous, Anxious, on Edge 1 0 2 1  Control/stop worrying 1 0 2 1  Worry too much - different things  1 0 2 1  Trouble relaxing 2 2 3 2   Restless 0 0 1 1  Easily  annoyed or irritable 2 2 3 2   Afraid - awful might happen 1 2 3 1   Total GAD 7 Score 8 6 16 9   Anxiety Difficulty Somewhat difficult Not difficult at all  Somewhat difficult   Assessment/ Plan: 73 y.o. male   Diabetes mellitus treated with injections of non-insulin medication (HCC) - Plan: Bayer DCA Hb A1c Waived, Semaglutide , 1 MG/DOSE, 4 MG/3ML SOPN  Hyperlipidemia associated with type 2 diabetes mellitus (HCC)  Hypertension associated with diabetes (HCC)  Caregiver stress  Hemoglobin A1c today is 7.1, this is down from 8.1 last visit.  He is down 4 pounds since last visit.  I think he is having a very good response to the Ozempic  so far but I am going to have him push the dose to 1 mg to get him at goal level blood sugar.   Continue statin, blood pressure medications as prescribed  Continue Wellbutrin .  Consider counseling services  Richard Cercone Bambi Bonine, DO [facility] [phone]

## 2024-05-24 NOTE — Patient Instructions (Addendum)
 Increase Ozempic  dose to 1mg  weekly.  Your sugar is nearly there.     Tips for success with Ozempic  (and by success, how not to be super sick on your stomach): Eat small meals AVOID heavy foods (fried/ high in carbs like bread, pasta, rice) AVOID carbonated beverages (soda/ beer, as these can increase bloating) DOUBLE your water intake (will help you avoid constipation/ dehydration)  Ozempic  CAN cause: Nausea Abdominal pain Increased acid reflux (sometimes presents as "sour burps") Constipation OR Diarrhea Fatigue (especially when you first start it)

## 2024-07-19 DIAGNOSIS — H524 Presbyopia: Secondary | ICD-10-CM | POA: Diagnosis not present

## 2024-07-19 DIAGNOSIS — E113293 Type 2 diabetes mellitus with mild nonproliferative diabetic retinopathy without macular edema, bilateral: Secondary | ICD-10-CM | POA: Diagnosis not present

## 2024-10-02 ENCOUNTER — Other Ambulatory Visit: Payer: Self-pay | Admitting: Family Medicine

## 2024-10-02 DIAGNOSIS — E119 Type 2 diabetes mellitus without complications: Secondary | ICD-10-CM

## 2024-10-19 ENCOUNTER — Other Ambulatory Visit: Payer: Self-pay | Admitting: Family Medicine

## 2024-10-19 DIAGNOSIS — E119 Type 2 diabetes mellitus without complications: Secondary | ICD-10-CM

## 2024-10-26 ENCOUNTER — Other Ambulatory Visit: Payer: Self-pay | Admitting: *Deleted

## 2024-10-26 DIAGNOSIS — E1159 Type 2 diabetes mellitus with other circulatory complications: Secondary | ICD-10-CM

## 2024-10-26 DIAGNOSIS — I152 Hypertension secondary to endocrine disorders: Secondary | ICD-10-CM

## 2024-11-10 ENCOUNTER — Other Ambulatory Visit: Payer: Self-pay | Admitting: Family Medicine

## 2024-11-10 DIAGNOSIS — E1169 Type 2 diabetes mellitus with other specified complication: Secondary | ICD-10-CM

## 2024-12-06 ENCOUNTER — Encounter: Payer: Self-pay | Admitting: Family Medicine

## 2024-12-07 ENCOUNTER — Other Ambulatory Visit (HOSPITAL_COMMUNITY): Payer: Self-pay

## 2024-12-08 ENCOUNTER — Other Ambulatory Visit (HOSPITAL_COMMUNITY): Payer: Self-pay

## 2024-12-10 ENCOUNTER — Telehealth: Payer: Self-pay | Admitting: Pharmacy Technician

## 2024-12-10 ENCOUNTER — Other Ambulatory Visit (HOSPITAL_COMMUNITY): Payer: Self-pay

## 2024-12-10 NOTE — Telephone Encounter (Signed)
 Pharmacy Patient Advocate Encounter   Received notification from Pt Calls Messages that prior authorization for Ozempic  1 mg/dose is required/requested.   Insurance verification completed.   The patient is insured through [facility].   Per test claim: The current 28 day co-pay is, $24.99.  No PA needed at this time. This test claim was processed through [facility]- copay amounts may vary at other pharmacies due to pharmacy/plan contracts, or as the patient moves through the different stages of their insurance plan.

## 2024-12-10 NOTE — Telephone Encounter (Signed)
 PA request has been Received. New Encounter has been or will be created for follow up. For additional info see Pharmacy Prior Auth telephone encounter from 12/10/24.

## 2024-12-14 ENCOUNTER — Other Ambulatory Visit (HOSPITAL_COMMUNITY): Payer: Self-pay

## 2024-12-14 ENCOUNTER — Telehealth: Payer: Self-pay

## 2024-12-14 NOTE — Telephone Encounter (Signed)
 Pharmacy Patient Advocate Encounter  Received notification from [employer] that Prior Authorization for Ozempic  (1 MG/DOSE) 4MG /3ML pen-injectors  has been APPROVED from 12/08/2024 to 12/08/2025. Ran test claim, Copay is $74.99. This test claim was processed through [facility]- copay amounts may vary at other pharmacies due to pharmacy/plan contracts, or as the patient moves through the different stages of their insurance plan.  30 day supply is $24.99  PA #/Case ID/Reference #: [id number]

## 2024-12-14 NOTE — Telephone Encounter (Signed)
 This note is from 12/08/2024  Pharmacy Patient Advocate Encounter   Received notification from Physician's Office that prior authorization for Ozempic  (1 MG/DOSE) 4MG /3ML pen-injectors is required/requested.   Insurance verification completed.   The patient is insured through [employer].   Per test claim: PA required; PA submitted to above mentioned insurance via Latent Key/confirmation #/EOC [id number] Status is pending

## 2024-12-29 ENCOUNTER — Other Ambulatory Visit: Payer: Self-pay | Admitting: Family Medicine

## 2024-12-29 DIAGNOSIS — E119 Type 2 diabetes mellitus without complications: Secondary | ICD-10-CM

## 2025-01-03 ENCOUNTER — Encounter: Payer: Self-pay | Admitting: Family Medicine

## 2025-01-03 ENCOUNTER — Ambulatory Visit: Payer: Self-pay | Admitting: Family Medicine

## 2025-01-03 VITALS — BP 118/68 | HR 86 | Temp 97.5°F | Ht 70.0 in | Wt 225.0 lb

## 2025-01-03 DIAGNOSIS — M7502 Adhesive capsulitis of left shoulder: Secondary | ICD-10-CM

## 2025-01-03 DIAGNOSIS — Z6379 Other stressful life events affecting family and household: Secondary | ICD-10-CM

## 2025-01-03 DIAGNOSIS — I152 Hypertension secondary to endocrine disorders: Secondary | ICD-10-CM | POA: Diagnosis not present

## 2025-01-03 DIAGNOSIS — N400 Enlarged prostate without lower urinary tract symptoms: Secondary | ICD-10-CM | POA: Diagnosis not present

## 2025-01-03 DIAGNOSIS — E1169 Type 2 diabetes mellitus with other specified complication: Secondary | ICD-10-CM | POA: Diagnosis not present

## 2025-01-03 DIAGNOSIS — Z7985 Long-term (current) use of injectable non-insulin antidiabetic drugs: Secondary | ICD-10-CM

## 2025-01-03 DIAGNOSIS — N528 Other male erectile dysfunction: Secondary | ICD-10-CM

## 2025-01-03 DIAGNOSIS — F4320 Adjustment disorder, unspecified: Secondary | ICD-10-CM

## 2025-01-03 DIAGNOSIS — Z0001 Encounter for general adult medical examination with abnormal findings: Secondary | ICD-10-CM

## 2025-01-03 DIAGNOSIS — Z0000 Encounter for general adult medical examination without abnormal findings: Secondary | ICD-10-CM

## 2025-01-03 DIAGNOSIS — E1159 Type 2 diabetes mellitus with other circulatory complications: Secondary | ICD-10-CM | POA: Diagnosis not present

## 2025-01-03 DIAGNOSIS — E66811 Obesity, class 1: Secondary | ICD-10-CM | POA: Diagnosis not present

## 2025-01-03 LAB — LIPID PANEL

## 2025-01-03 MED ORDER — SILDENAFIL CITRATE 100 MG PO TABS: 50.0000 mg | ORAL_TABLET | Freq: Every day | ORAL | 11 refills | Status: AC | PRN

## 2025-01-03 MED ORDER — GLYBURIDE 5 MG PO TABS: 5.0000 mg | ORAL_TABLET | Freq: Every day | ORAL | 4 refills | Status: AC

## 2025-01-03 MED ORDER — SERTRALINE HCL 50 MG PO TABS: 50.0000 mg | ORAL_TABLET | Freq: Every day | ORAL | 0 refills | Status: AC

## 2025-01-03 MED ORDER — ENALAPRIL MALEATE 5 MG PO TABS: 5.0000 mg | ORAL_TABLET | Freq: Every day | ORAL | 4 refills | Status: AC

## 2025-01-03 MED ORDER — ATORVASTATIN CALCIUM 20 MG PO TABS: 20.0000 mg | ORAL_TABLET | Freq: Every day | ORAL | 4 refills | Status: AC

## 2025-01-03 MED ORDER — SEMAGLUTIDE (1 MG/DOSE) 4 MG/3ML ~~LOC~~ SOPN: 1.0000 mg | PEN_INJECTOR | SUBCUTANEOUS | 4 refills | Status: DC

## 2025-01-03 MED ORDER — BUPROPION HCL ER (XL) 300 MG PO TB24: 300.0000 mg | ORAL_TABLET | Freq: Every day | ORAL | 4 refills | Status: AC

## 2025-01-03 MED ORDER — DAPAGLIFLOZIN PRO-METFORMIN ER 5-1000 MG PO TB24: 1.0000 | ORAL_TABLET | Freq: Two times a day (BID) | ORAL | 4 refills | Status: AC

## 2025-01-03 NOTE — Progress Notes (Addendum)
 "  Richard Newman. is a 74 y.o. male presents to office today for annual physical exam examination.     Type 2 Diabetes with hypertension, hyperlipidemia:  He is compliant with all of his medications but notes that he thinks that the blood sugars will likely be elevated secondary to emotional eating.  He has sadly lost his father in 2025.  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  Luckily, he reports he now has somebody to come in and help her but of course his spirit has been pretty low after the fasting of his father.  He still has not had his moment where he has broken down but he admits that he has lost interest in things that typically bring him happiness.  He is compliant with Wellbutrin  extended release 300 mg daily.  He also reports some left-sided shoulder pain.  No preceding injury.  Has difficulty raising his left upper extremity above shoulder level  Last eye exam: Up-to-date Last foot exam: Needs Last A1c:  Lab Results  Component Value Date   HGBA1C 7.1 (H) 05/24/2024   Nephropathy screen indicated?:  Needs Last flu, zoster and/or pneumovax:  Immunization History  Administered Date(s) Administered   Fluad Quad(high Dose 65+) 09/22/2023   INFLUENZA, HIGH DOSE SEASONAL PF 09/09/2018, 08/29/2019   Influenza Inj Mdck Quad Pf 10/06/2017, 10/02/2021   Influenza,inj,Quad PF,6+ Mos 10/11/2019, 09/24/2022   Influenza-Unspecified 10/16/2016, 09/15/2020, 12/02/2024   Moderna Sars-Covid-2 Vaccination 01/12/2020, 02/09/2020, 11/06/2020   Pneumococcal Conjugate-13 10/08/2012, 10/11/2019   Pneumococcal Polysaccharide-23 11/08/2020   Tdap 06/15/2009, 11/12/2019   Zoster Recombinant(Shingrix) 07/01/2023, 09/22/2023   Zoster, Live 02/08/2013    ROS: No reports of chest pain, shortness of breath, edema, visual disturbance or falls  Occupation: Engineer, site in [place] , Marital status: Married, Substance use: None Health Maintenance Due  Topic  Date Due   Diabetic kidney evaluation - eGFR measurement  10/12/2024   Diabetic kidney evaluation - Urine ACR  10/12/2024   HEMOGLOBIN A1C  11/23/2024    Immunization History  Administered Date(s) Administered   Fluad Quad(high Dose 65+) 09/22/2023   INFLUENZA, HIGH DOSE SEASONAL PF 09/09/2018, 08/29/2019   Influenza Inj Mdck Quad Pf 10/06/2017, 10/02/2021   Influenza,inj,Quad PF,6+ Mos 10/11/2019, 09/24/2022   Influenza-Unspecified 10/16/2016, 09/15/2020, 12/02/2024   Moderna Sars-Covid-2 Vaccination 01/12/2020, 02/09/2020, 11/06/2020   Pneumococcal Conjugate-13 10/08/2012, 10/11/2019   Pneumococcal Polysaccharide-23 11/08/2020   Tdap 06/15/2009, 11/12/2019   Zoster Recombinant(Shingrix) 07/01/2023, 09/22/2023   Zoster, Live 02/08/2013   Past Medical History:  Diagnosis Date   Allergy    Anxiety states    Decreased free testosterone  level in male    Depressive disorder, not elsewhere classified    DM (diabetes mellitus) (HCC)    Hemorrhage of rectum and anus    Hyperlipidemia    Hyperplasia of prostate    Hypertension    Polyp of colon    Social History   Socioeconomic History   Marital status: Married    Spouse name: Not on file   Number of children: Not on file   Years of education: Not on file   Highest education level: Not on file  Occupational History   Not on file  Tobacco Use   Smoking status: Never   Smokeless tobacco: Never  Vaping Use   Vaping status: Never Used  Substance and Sexual Activity   Alcohol use: No   Drug use: No   Sexual activity: Not on file  Other Topics Concern   Not on file  Social History Narrative   Not on file   Social Drivers of Health   Tobacco Use: Low Risk (01/03/2025)   Patient History    Smoking Tobacco Use: Never    Smokeless Tobacco Use: Never    Passive Exposure: Not on file  Financial Resource Strain: Not on file  Food Insecurity: Not on file  Transportation Needs: Not on file  Physical Activity: Not on file   Stress: Not on file  Social Connections: Not on file  Intimate Partner Violence: Not on file  Depression (PHQ2-9): Medium Risk (05/24/2024)   Depression (PHQ2-9)    PHQ-2 Score: 7  Alcohol Screen: Not on file  Housing: Not on file  Utilities: Not on file  Health Literacy: Not on file   Past Surgical History:  Procedure Laterality Date   COLONOSCOPY     CYST REMOVAL TRUNK     KNEE SURGERY     LEFT   POLYPECTOMY     Family History  Problem Relation Age of Onset   Heart disease Father 80       CABG   Failure to thrive Father        ultimate cause of death 01-Feb-2024   Diabetes Brother    Cancer Brother 52       brain tumor   Diabetes Brother    Cancer Maternal Aunt        brain cancer   Colon cancer Paternal Uncle    Stroke Maternal Grandmother    Esophageal cancer Neg Hx    Rectal cancer Neg Hx    Stomach cancer Neg Hx    Current Medications[1]  Allergies[2]   ROS: Review of Systems Pertinent items noted in HPI and remainder of comprehensive ROS otherwise negative.    Physical exam BP 118/68   Pulse 86   Temp (!) 97.5 F (36.4 C)   Ht 5' 10 (1.778 m)   Wt 225 lb (102.1 kg)   SpO2 96%   BMI 32.28 kg/m  General appearance: alert, cooperative, appears stated age, no distress, and moderately obese Head: Normocephalic, without obvious abnormality, atraumatic Eyes: negative findings: lids and lashes normal, conjunctivae and sclerae normal, corneas clear, and pupils equal, round, reactive to light and accomodation Ears: normal TM's and external ear canals both ears Nose: Nares normal. Septum midline. Mucosa normal. No drainage or sinus tenderness. Throat: lips, mucosa, and tongue normal; teeth and gums normal Neck: no adenopathy, no carotid bruit, supple, symmetrical, trachea midline, and thyroid  not enlarged, symmetric, no tenderness/mass/nodules Back: symmetric, no curvature. ROM normal. No CVA tenderness. Lungs: clear to auscultation bilaterally Heart: regular  rate and rhythm, S1, S2 normal, no murmur, click, rub or gallop Abdomen: soft, non-tender; bowel sounds normal; no masses,  no organomegaly Extremities: extremities normal, atraumatic, no cyanosis or edema Pulses: 2+ and symmetric Skin: Skin color, texture, turgor normal. No rashes or lesions Lymph nodes: No supraclavicular or anterior cervical lymphadenopathy Neurologic: Alert and oriented X 3, normal strength and tone. Normal symmetric reflexes. Normal coordination and gait  Psych: Mood stable but appears slightly withdrawn and stoic. MSK: Painful arc sign present.  Cannot actively ABduct left shoulder past 90 degrees     05/24/2024   10:11 AM 02/02/2024   10:28 AM 10/13/2023    4:15 PM  Depression screen PHQ 2/9  Decreased Interest 1 1 1   Down, Depressed, Hopeless 1 1 1   PHQ - 2 Score 2 2 2   Altered sleeping  1 1 1   Tired, decreased energy 1 1 1   Change in appetite 1 1 1   Feeling bad or failure about yourself  1 0 1  Trouble concentrating 0 2 1  Moving slowly or fidgety/restless 0 0 0  Suicidal thoughts 1 1 1   PHQ-9 Score 7  8  8    Difficult doing work/chores Somewhat difficult Somewhat difficult Somewhat difficult     Data saved with a previous flowsheet row definition      05/24/2024   10:10 AM 02/02/2024   10:29 AM 10/13/2023    4:15 PM 06/27/2023    3:56 PM  GAD 7 : Generalized Anxiety Score  Nervous, Anxious, on Edge 1  0  2  1   Control/stop worrying 1  0  2  1   Worry too much - different things 1  0  2  1   Trouble relaxing 2  2  3  2    Restless 0  0  1  1   Easily annoyed or irritable 2  2  3  2    Afraid - awful might happen 1  2  3  1    Total GAD 7 Score 8 6 16 9   Anxiety Difficulty Somewhat difficult Not difficult at all  Somewhat difficult     Data saved with a previous flowsheet row definition    No results found for this or any previous visit (from the past 2160 hours).   Assessment/ Plan: Richard JULIANNA Auston Mickey. here for annual physical exam.   Annual  physical exam  Diabetes mellitus treated with injections of non-insulin medication (HCC) - Plan: CMP14+EGFR, Bayer DCA Hb A1c Waived, CBC with Differential, Microalbumin / creatinine urine ratio, DG [facility] DEXA, glyBURIDE  (DIABETA ) 5 MG tablet, Semaglutide , 1 MG/DOSE, 4 MG/3ML SOPN, Dapagliflozin  Pro-metFORMIN  ER (XIGDUO  XR) 04-999 MG TB24  Hyperlipidemia associated with type 2 diabetes mellitus (HCC) - Plan: CMP14+EGFR, Lipid Panel, TSH, atorvastatin  (LIPITOR) 20 MG tablet  Hypertension associated with diabetes (HCC) - Plan: CMP14+EGFR, enalapril  (VASOTEC ) 5 MG tablet  Hyperplasia of prostate - Plan: CMP14+EGFR, PSA  Obesity (BMI 30.0-34.9) - Plan: CMP14+EGFR, VITAMIN D  25 Hydroxy (Vit-D Deficiency, Fractures)  Grief reaction - Plan: sertraline  (ZOLOFT ) 50 MG tablet, Ambulatory referral to Integrated Behavioral Health  Stress due to illness of family member - Plan: sertraline  (ZOLOFT ) 50 MG tablet, buPROPion  (WELLBUTRIN  XL) 300 MG 24 hr tablet, Ambulatory referral to Integrated Behavioral Health  Other male erectile dysfunction - Plan: sildenafil  (VIAGRA ) 100 MG tablet  Adhesive capsulitis of left shoulder   Nonfasting labs collected.  Have ordered DEXA scan as this is a recommendation by the ADA and patients over 65 who are diabetic.  Collect urine microalbumin.  Foot exam performed which was unrevealing today  Blood pressure under excellent control upon recheck.  No changes.  Adding Zoloft  50 mg daily for mood.  Continue Wellbutrin  extended release 300 mg daily.  Offered referral for counseling services and I will reach out to our integrated behavioral health specialist to see if this might be something he can offer either late in the day or at lunchtime.  Patient does work in [place]  but can drive down to the [place]  line and efforts to get into [facility]  for a virtual visit if needed  For his left shoulder suspect adhesive capsulitis possibly in the setting of  bursitis versus tendinopathy.  Home physical therapy exercises given.  Advised 3 times daily use of Voltaren  gel topically.  Further intervention  planned if symptoms are refractory.  Will reevaluate again in 6 weeks as discussed  Patient and/or legal guardian verbally consented to [facility] services about presenting concerns and psychiatric consultation as appropriate.  The services will be billed as appropriate for the patient  Counseled on healthy lifestyle choices, including diet (rich in fruits, vegetables and lean meats and low in salt and simple carbohydrates) and exercise (at least 30 minutes of moderate physical activity daily).  Patient to follow up 6-8 weeks for mood  Lanay Zinda M. Kiersten Coss, DO        [1]  Current Outpatient Medications:    aspirin 81 MG EC tablet, Take 81 mg by mouth daily., Disp: , Rfl:    atorvastatin  (LIPITOR) 20 MG tablet, TAKE 1 TABLET BY MOUTH EVERY DAY, Disp: 90 tablet, Rfl: 0   buPROPion  (WELLBUTRIN  XL) 300 MG 24 hr tablet, TAKE 1 TABLET BY MOUTH EVERY DAY, Disp: 90 tablet, Rfl: 3   clomiPHENE (CLOMID) 50 MG tablet, Take 25 mg by mouth daily., Disp: , Rfl:    diclofenac  Sodium (VOLTAREN ) 1 % GEL, APPLY 2 GRAMS TO AFFECTED AREA 4 TIMES A DAY, Disp: 400 g, Rfl: PRN   enalapril  (VASOTEC ) 5 MG tablet, TAKE 1 TABLET (5 MG TOTAL) BY MOUTH DAILY., Disp: 90 tablet, Rfl: 0   fluticasone  (FLONASE ) 50 MCG/ACT nasal spray, SPRAY 2 SPRAYS INTO EACH NOSTRIL EVERY DAY, Disp: 48 mL, Rfl: 1   glyBURIDE  (DIABETA ) 5 MG tablet, TAKE 1 TABLET BY MOUTH EVERY DAY WITH BREAKFAST, Disp: 90 tablet, Rfl: 0   Semaglutide , 1 MG/DOSE, 4 MG/3ML SOPN, Inject 1 mg as directed once a week., Disp: 9 mL, Rfl: 3   sertraline  (ZOLOFT ) 50 MG tablet, Take 1 tablet (50 mg total) by mouth daily., Disp: 90 tablet, Rfl: 0   sildenafil  (VIAGRA ) 100 MG tablet, Take 0.5-1 tablets (50-100 mg total) by mouth daily as needed for erectile dysfunction., Disp: 30 tablet, Rfl: 11    XIGDUO  XR 04-999 MG TB24, TAKE 1 TABLET BY MOUTH IN THE MORNING AND IN THE EVENING, Disp: 180 tablet, Rfl: 4   Blood Glucose Monitoring Suppl (ONETOUCH VERIO) w/Device KIT, 1 kit by Does not apply route daily. (Patient not taking: Reported on 01/03/2025), Disp: 1 kit, Rfl: 0   Continuous Blood Gluc Sensor (FREESTYLE LIBRE 2 SENSOR) MISC, Use to check blood sugar continuously e11.69 (Patient not taking: Reported on 01/03/2025), Disp: 6 each, Rfl: 4   glucose blood (ONETOUCH VERIO) test strip, TEST BLOOD SUGAR TWICE A DAY Dx E11.9 (Patient not taking: Reported on 01/03/2025), Disp: 200 strip, Rfl: 3   OneTouch Delica Lancets 33G MISC, TEST BLOOD SUGAR TWICE A DAY Dx E11.9 (Patient not taking: Reported on 01/03/2025), Disp: 200 each, Rfl: 3 [2]  Allergies Allergen Reactions   Sulfonamide Derivatives Swelling    SWELLING OF TONGUE   Erythromycin Other (See Comments)    Patient unsure reaction   "

## 2025-01-03 NOTE — Addendum Note (Signed)
 Addended by: JOLINDA NORENE HERO on: 01/03/2025 04:14 PM   Modules accepted: Level of Service

## 2025-01-03 NOTE — Patient Instructions (Addendum)
 Bone Density Test: What to Expect A bone density test uses a type of X-ray to measure the amount of calcium and other minerals in your bones. It can measure bone density in the hip and the spine. This test may also be called: Bone densitometry. Bone mineral density test. Dual-energy X-ray absorptiometry (DEXA). You may have this test to: Diagnose or screen for a condition that causes weak or thin bones, called osteoporosis. See what your risk is for a broken bone, also called a fracture. Check how well your treatment for weak or thin bones is working. The test is similar to having a regular X-ray. Tell a health care provider about: Any allergies you have. All medicines you're taking, including vitamins, herbs, eye drops, creams, and over-the-counter medicines. Any problems you or family members have had with anesthesia. Any bleeding problems you have. Any surgeries you've had. Any medical conditions you have. Whether you're pregnant or may be pregnant. Any medical tests you've had within the past 14 days that used contrast. What are the risks? Your health care provider will talk with you about risks. These may include: Being exposed to a small amount of radiation. This can slightly increase your cancer risk. What happens before the test? Do not take any calcium supplements within the 24 hours before your test. You'll need to take off: All metal jewelry. Eyeglasses. Removable dental appliances. Any other metal objects on your body. What happens during the test?  You'll lie down on an exam table. There will be an X-ray machine below you and an imaging device above you. Other devices, such as boxes or braces, may be used to position your body for the scan. The machine will slowly scan your body. You'll need to keep very still while the machine does the scan. The images will show up on a screen in the room. Images will be checked by a specialist after your test is finished. These  steps may vary. Ask what you can expect. What can I expect after the test? Ask when your results will be ready and how to get them. You may need to call or meet with your provider to get your results. This information is not intended to replace advice given to you by your health care provider. Make sure you discuss any questions you have with your health care provider. Document Revised: 04/13/2023 Document Reviewed: 04/13/2023 Elsevier Patient Education  2024 [employer].

## 2025-01-04 ENCOUNTER — Ambulatory Visit: Payer: Self-pay | Admitting: Family Medicine

## 2025-01-04 ENCOUNTER — Other Ambulatory Visit: Payer: Self-pay | Admitting: Family Medicine

## 2025-01-04 DIAGNOSIS — E559 Vitamin D deficiency, unspecified: Secondary | ICD-10-CM

## 2025-01-04 DIAGNOSIS — M8589 Other specified disorders of bone density and structure, multiple sites: Secondary | ICD-10-CM

## 2025-01-04 LAB — CBC WITH DIFFERENTIAL/PLATELET
Basophils Absolute: 0 x10E3/uL (ref 0.0–0.2)
Basos: 0 %
EOS (ABSOLUTE): 0.3 x10E3/uL (ref 0.0–0.4)
Eos: 3 %
Hematocrit: 51.7 % — ABNORMAL HIGH (ref 37.5–51.0)
Hemoglobin: 16.6 g/dL (ref 13.0–17.7)
Immature Grans (Abs): 0 x10E3/uL (ref 0.0–0.1)
Immature Granulocytes: 0 %
Lymphocytes Absolute: 2.1 x10E3/uL (ref 0.7–3.1)
Lymphs: 23 %
MCH: 26.8 pg (ref 26.6–33.0)
MCHC: 32.1 g/dL (ref 31.5–35.7)
MCV: 83 fL (ref 79–97)
Monocytes Absolute: 0.6 x10E3/uL (ref 0.1–0.9)
Monocytes: 7 %
Neutrophils Absolute: 6.1 x10E3/uL (ref 1.4–7.0)
Neutrophils: 67 %
Platelets: 270 x10E3/uL (ref 150–450)
RBC: 6.2 x10E6/uL — ABNORMAL HIGH (ref 4.14–5.80)
RDW: 13.4 % (ref 11.6–15.4)
WBC: 9.2 x10E3/uL (ref 3.4–10.8)

## 2025-01-04 LAB — VITAMIN D 25 HYDROXY (VIT D DEFICIENCY, FRACTURES): Vit D, 25-Hydroxy: 18.8 ng/mL — AB (ref 30.0–100.0)

## 2025-01-04 LAB — LIPID PANEL
Cholesterol, Total: 129 mg/dL (ref 100–199)
HDL: 47 mg/dL
LDL CALC COMMENT:: 2.7 ratio (ref 0.0–5.0)
LDL Chol Calc (NIH): 54 mg/dL (ref 0–99)
Triglycerides: 165 mg/dL — AB (ref 0–149)
VLDL Cholesterol Cal: 28 mg/dL (ref 5–40)

## 2025-01-04 LAB — PSA: Prostate Specific Ag, Serum: 1.2 ng/mL (ref 0.0–4.0)

## 2025-01-04 LAB — CMP14+EGFR
ALT: 21 IU/L (ref 0–44)
AST: 22 IU/L (ref 0–40)
Albumin: 4.6 g/dL (ref 3.8–4.8)
Alkaline Phosphatase: 109 IU/L (ref 47–123)
BUN/Creatinine Ratio: 10 (ref 10–24)
BUN: 8 mg/dL (ref 8–27)
Bilirubin Total: 0.5 mg/dL (ref 0.0–1.2)
CO2: 23 mmol/L (ref 20–29)
Calcium: 9.7 mg/dL (ref 8.6–10.2)
Chloride: 102 mmol/L (ref 96–106)
Creatinine, Ser: 0.83 mg/dL (ref 0.76–1.27)
Globulin, Total: 2 g/dL (ref 1.5–4.5)
Glucose: 105 mg/dL — AB (ref 70–99)
Potassium: 4.1 mmol/L (ref 3.5–5.2)
Sodium: 140 mmol/L (ref 134–144)
Total Protein: 6.6 g/dL (ref 6.0–8.5)
eGFR: 93 mL/min/1.73

## 2025-01-04 LAB — BAYER DCA HB A1C WAIVED: HB A1C (BAYER DCA - WAIVED): 7.2 % — ABNORMAL HIGH (ref 4.8–5.6)

## 2025-01-04 LAB — TSH: TSH: 2.05 u[IU]/mL (ref 0.450–4.500)

## 2025-01-04 MED ORDER — VITAMIN D (ERGOCALCIFEROL) 1.25 MG (50000 UNIT) PO CAPS: 50000.0000 [IU] | ORAL_CAPSULE | ORAL | 3 refills | Status: AC

## 2025-01-05 LAB — MICROALBUMIN / CREATININE URINE RATIO
Creatinine, Urine: 40.7 mg/dL
Microalb/Creat Ratio: 9 mg/g{creat} (ref 0–29)
Microalbumin, Urine: 3.5 ug/mL

## 2025-01-07 ENCOUNTER — Other Ambulatory Visit: Payer: Self-pay | Admitting: Family Medicine

## 2025-01-07 DIAGNOSIS — Z7985 Long-term (current) use of injectable non-insulin antidiabetic drugs: Secondary | ICD-10-CM

## 2025-01-07 MED ORDER — SEMAGLUTIDE (2 MG/DOSE) 8 MG/3ML ~~LOC~~ SOPN: 2.0000 mg | PEN_INJECTOR | SUBCUTANEOUS | 3 refills | Status: AC

## 2025-01-11 ENCOUNTER — Institutional Professional Consult (permissible substitution): Payer: Self-pay | Admitting: Professional Counselor

## 2025-01-13 ENCOUNTER — Ambulatory Visit

## 2025-01-13 DIAGNOSIS — Z7985 Long-term (current) use of injectable non-insulin antidiabetic drugs: Secondary | ICD-10-CM | POA: Diagnosis not present

## 2025-01-13 DIAGNOSIS — E119 Type 2 diabetes mellitus without complications: Secondary | ICD-10-CM | POA: Diagnosis not present

## 2025-01-13 DIAGNOSIS — Z13820 Encounter for screening for osteoporosis: Secondary | ICD-10-CM | POA: Diagnosis not present

## 2025-01-18 ENCOUNTER — Institutional Professional Consult (permissible substitution): Payer: Self-pay | Admitting: Professional Counselor

## 2025-01-19 DIAGNOSIS — M8589 Other specified disorders of bone density and structure, multiple sites: Secondary | ICD-10-CM | POA: Insufficient documentation

## 2025-01-25 ENCOUNTER — Institutional Professional Consult (permissible substitution): Payer: Self-pay | Admitting: Professional Counselor

## 2025-02-18 ENCOUNTER — Ambulatory Visit: Admitting: Family Medicine
